# Patient Record
Sex: Male | Born: 1965
Health system: Southern US, Community
[De-identification: ages and names within clinical notes are randomized; demographics above are authoritative.]

## PROBLEM LIST (undated history)

## (undated) DIAGNOSIS — Z9889 Other specified postprocedural states: Secondary | ICD-10-CM

## (undated) DIAGNOSIS — I313 Pericardial effusion (noninflammatory): Secondary | ICD-10-CM

## (undated) DIAGNOSIS — Z9289 Personal history of other medical treatment: Secondary | ICD-10-CM

## (undated) DIAGNOSIS — I7121 Aneurysm of the ascending aorta, without rupture: Secondary | ICD-10-CM

## (undated) DIAGNOSIS — I3139 Other pericardial effusion (noninflammatory): Secondary | ICD-10-CM

## (undated) DIAGNOSIS — I712 Thoracic aortic aneurysm, without rupture: Secondary | ICD-10-CM

## (undated) DIAGNOSIS — I351 Nonrheumatic aortic (valve) insufficiency: Secondary | ICD-10-CM

## (undated) DIAGNOSIS — I428 Other cardiomyopathies: Secondary | ICD-10-CM

## (undated) HISTORY — DX: Nonrheumatic aortic (valve) insufficiency: I35.1

## (undated) HISTORY — DX: Other cardiomyopathies: I42.8

## (undated) HISTORY — DX: Aneurysm of the ascending aorta, without rupture: I71.21

## (undated) HISTORY — DX: Thoracic aortic aneurysm, without rupture: I71.2

## (undated) HISTORY — DX: Personal history of other medical treatment: Z92.89

## (undated) HISTORY — DX: Other specified postprocedural states: Z98.890

---

## 2013-03-29 ENCOUNTER — Emergency Department (HOSPITAL_COMMUNITY)
Admission: EM | Admit: 2013-03-29 | Discharge: 2013-03-29 | Disposition: A | Discharge status: Home / Self Care | Payer: BC Managed Care – PPO | Source: Home / Self Care | Attending: Family Medicine | Admitting: Family Medicine

## 2013-03-29 ENCOUNTER — Emergency Department (INDEPENDENT_AMBULATORY_CARE_PROVIDER_SITE_OTHER): Payer: BC Managed Care – PPO

## 2013-03-29 ENCOUNTER — Encounter (HOSPITAL_COMMUNITY): Payer: Self-pay | Admitting: Emergency Medicine

## 2013-03-29 DIAGNOSIS — J111 Influenza due to unidentified influenza virus with other respiratory manifestations: Secondary | ICD-10-CM

## 2013-03-29 MED ORDER — GUAIFENESIN-CODEINE 100-10 MG/5ML PO SOLN: 5.0000 mL | Freq: Every evening | ORAL | Status: DC | PRN

## 2013-03-29 MED ORDER — OSELTAMIVIR PHOSPHATE 75 MG PO CAPS
75.0000 mg | ORAL_CAPSULE | Freq: Two times a day (BID) | ORAL | Status: DC
Start: 2013-03-29 — End: 2014-02-18

## 2013-03-29 MED ORDER — IPRATROPIUM BROMIDE 0.06 % NA SOLN: 2.0000 | Freq: Four times a day (QID) | NASAL | Status: DC

## 2013-03-29 NOTE — ED Notes (Signed)
C/o cold sx States fever, cough, and congestion in the chest Ibuprofen is taking for fever

## 2013-03-29 NOTE — ED Provider Notes (Signed)
Jeffrey [name] is a 47 y.o. male who presents to Urgent Care today for fever and cough along with nasal congestion and nasal discharge. This is been present since yesterday. Patient to fevers today and took over-the-counter ibuprofen (200mg ) just prior to presentation. No shortness of breath nausea vomiting or diarrhea. Patient feels well otherwise. No significant body aches. No dysuria symptoms.   History reviewed. No pertinent past medical history. History  Substance Use Topics  . Smoking status: Not on file  . Smokeless tobacco: Not on file  . Alcohol Use: Not on file   ROS as above Medications reviewed. No current facility-administered medications for this encounter.   Current Outpatient Prescriptions  Medication Sig Dispense Refill  . guaiFENesin-codeine 100-10 MG/5ML syrup Take 5 mLs by mouth at bedtime as needed for cough.  120 mL  0  . ipratropium (ATROVENT) 0.06 % nasal spray Place 2 sprays into both nostrils 4 (four) times daily.  15 mL  1  . oseltamivir (TAMIFLU) 75 MG capsule Take 1 capsule (75 mg total) by mouth every 12 (twelve) hours.  10 capsule  0    Exam:  BP 151/79  Pulse 95  Temp(Src) 102.5 F (39.2 C) (Oral)  Resp 18  SpO2 100% Gen: Well NAD HEENT: EOMI,  MMM no facial tenderness. Inflamed nasal turbinates are present. Tympanic membranes are normal appearing bilaterally. Posterior pharynx is mildly erythematous. Lungs: Normal work of breathing. CTABL Heart: RRR no MRG Abd: NABS, Soft. NT, ND Exts: Non edematous BL  LE, warm and well perfused.   No results found for this or any previous visit (from the past 24 hour(s)). Dg Chest 2 View  03/29/2013   CLINICAL DATA:  Cough, congestion, fever  EXAM: CHEST  2 VIEW  COMPARISON:  None.  FINDINGS: Low lung volumes. No focal consolidation. No pleural effusion or pneumothorax.  The heart is top-normal in size for inspiration.  Visualized osseous structures are within normal limits.  IMPRESSION: No evidence of  acute cardiopulmonary disease.   Electronically Signed   By: Charline Bills M.D.   On: 03/29/2013 20:39    Assessment and Plan: 47 y.o. male with influenza-like illness. Patient has a significantly elevated temperature. This is abnormal for typical viral URI. Chest x-ray is normal as is his lung exam there for pneumonia is less likely. Plan to treat apparently with Tamiflu for influenza. Additionally will use codeine containing cough medication and Atrovent nasal spray for symptom control. Continue ibuprofen however at higher dose for fever control. Discussed warning signs or symptoms. Please see discharge instructions. Patient expresses understanding.      Rodolph Bong, MD 03/29/13 2051

## 2014-02-18 ENCOUNTER — Emergency Department (HOSPITAL_COMMUNITY): Payer: BC Managed Care – PPO

## 2014-02-18 ENCOUNTER — Inpatient Hospital Stay (HOSPITAL_COMMUNITY)
Admission: EM | Admit: 2014-02-18 | Discharge: 2014-03-03 | DRG: 216 | Disposition: A | Discharge status: Home / Self Care | Payer: BC Managed Care – PPO | Attending: Cardiothoracic Surgery | Admitting: Cardiothoracic Surgery

## 2014-02-18 ENCOUNTER — Encounter (HOSPITAL_COMMUNITY): Payer: Self-pay | Admitting: *Deleted

## 2014-02-18 DIAGNOSIS — I712 Thoracic aortic aneurysm, without rupture, unspecified: Secondary | ICD-10-CM

## 2014-02-18 DIAGNOSIS — K219 Gastro-esophageal reflux disease without esophagitis: Secondary | ICD-10-CM | POA: Diagnosis present

## 2014-02-18 DIAGNOSIS — I313 Pericardial effusion (noninflammatory): Secondary | ICD-10-CM | POA: Diagnosis present

## 2014-02-18 DIAGNOSIS — R0600 Dyspnea, unspecified: Secondary | ICD-10-CM | POA: Diagnosis present

## 2014-02-18 DIAGNOSIS — D696 Thrombocytopenia, unspecified: Secondary | ICD-10-CM | POA: Diagnosis not present

## 2014-02-18 DIAGNOSIS — R011 Cardiac murmur, unspecified: Secondary | ICD-10-CM

## 2014-02-18 DIAGNOSIS — Z79899 Other long term (current) drug therapy: Secondary | ICD-10-CM

## 2014-02-18 DIAGNOSIS — R410 Disorientation, unspecified: Secondary | ICD-10-CM | POA: Diagnosis not present

## 2014-02-18 DIAGNOSIS — D62 Acute posthemorrhagic anemia: Secondary | ICD-10-CM | POA: Diagnosis not present

## 2014-02-18 DIAGNOSIS — Z952 Presence of prosthetic heart valve: Secondary | ICD-10-CM

## 2014-02-18 DIAGNOSIS — Z9889 Other specified postprocedural states: Secondary | ICD-10-CM

## 2014-02-18 DIAGNOSIS — J939 Pneumothorax, unspecified: Secondary | ICD-10-CM

## 2014-02-18 DIAGNOSIS — I428 Other cardiomyopathies: Secondary | ICD-10-CM | POA: Insufficient documentation

## 2014-02-18 DIAGNOSIS — I340 Nonrheumatic mitral (valve) insufficiency: Secondary | ICD-10-CM | POA: Diagnosis present

## 2014-02-18 DIAGNOSIS — I358 Other nonrheumatic aortic valve disorders: Principal | ICD-10-CM | POA: Diagnosis present

## 2014-02-18 DIAGNOSIS — I719 Aortic aneurysm of unspecified site, without rupture: Secondary | ICD-10-CM

## 2014-02-18 DIAGNOSIS — I5033 Acute on chronic diastolic (congestive) heart failure: Secondary | ICD-10-CM | POA: Diagnosis present

## 2014-02-18 DIAGNOSIS — J90 Pleural effusion, not elsewhere classified: Secondary | ICD-10-CM

## 2014-02-18 DIAGNOSIS — I739 Peripheral vascular disease, unspecified: Secondary | ICD-10-CM | POA: Diagnosis present

## 2014-02-18 DIAGNOSIS — Z791 Long term (current) use of non-steroidal anti-inflammatories (NSAID): Secondary | ICD-10-CM

## 2014-02-18 DIAGNOSIS — I509 Heart failure, unspecified: Secondary | ICD-10-CM

## 2014-02-18 DIAGNOSIS — I351 Nonrheumatic aortic (valve) insufficiency: Secondary | ICD-10-CM | POA: Diagnosis present

## 2014-02-18 DIAGNOSIS — Z9689 Presence of other specified functional implants: Secondary | ICD-10-CM

## 2014-02-18 DIAGNOSIS — M109 Gout, unspecified: Secondary | ICD-10-CM | POA: Diagnosis present

## 2014-02-18 DIAGNOSIS — I5032 Chronic diastolic (congestive) heart failure: Secondary | ICD-10-CM

## 2014-02-18 HISTORY — DX: Dyspnea, unspecified: R06.00

## 2014-02-18 LAB — CBC WITH DIFFERENTIAL/PLATELET
Basophils Absolute: 0 10*3/uL (ref 0.0–0.1)
Basophils Relative: 0 % (ref 0–1)
EOS ABS: 0.1 10*3/uL (ref 0.0–0.7)
EOS PCT: 2 % (ref 0–5)
HCT: 41.7 % (ref 39.0–52.0)
Hemoglobin: 14.1 g/dL (ref 13.0–17.0)
LYMPHS ABS: 1.3 10*3/uL (ref 0.7–4.0)
LYMPHS PCT: 17 % (ref 12–46)
MCH: 27.4 pg (ref 26.0–34.0)
MCHC: 33.8 g/dL (ref 30.0–36.0)
MCV: 81.1 fL (ref 78.0–100.0)
Monocytes Absolute: 0.5 10*3/uL (ref 0.1–1.0)
Monocytes Relative: 6 % (ref 3–12)
NEUTROS PCT: 75 % (ref 43–77)
Neutro Abs: 5.7 10*3/uL (ref 1.7–7.7)
Platelets: 177 10*3/uL (ref 150–400)
RBC: 5.14 MIL/uL (ref 4.22–5.81)
RDW: 13.5 % (ref 11.5–15.5)
WBC: 7.6 10*3/uL (ref 4.0–10.5)

## 2014-02-18 LAB — COMPREHENSIVE METABOLIC PANEL
ALK PHOS: 77 U/L (ref 39–117)
ALT: 27 U/L (ref 0–53)
AST: 21 U/L (ref 0–37)
Albumin: 3.5 g/dL (ref 3.5–5.2)
Anion gap: 13 (ref 5–15)
BUN: 11 mg/dL (ref 6–23)
CALCIUM: 9.1 mg/dL (ref 8.4–10.5)
CO2: 21 meq/L (ref 19–32)
Chloride: 105 mEq/L (ref 96–112)
Creatinine, Ser: 0.91 mg/dL (ref 0.50–1.35)
GFR calc non Af Amer: >90 mL/min (ref >90)
GLUCOSE: 101 mg/dL — AB (ref 70–99)
POTASSIUM: 4.7 meq/L (ref 3.7–5.3)
Sodium: 139 mEq/L (ref 137–147)
TOTAL PROTEIN: 6.2 g/dL (ref 6.0–8.3)
Total Bilirubin: 0.6 mg/dL (ref 0.3–1.2)

## 2014-02-18 LAB — CBC
HEMATOCRIT: 40.1 % (ref 39.0–52.0)
Hemoglobin: 13.8 g/dL (ref 13.0–17.0)
MCH: 27.7 pg (ref 26.0–34.0)
MCHC: 34.4 g/dL (ref 30.0–36.0)
MCV: 80.5 fL (ref 78.0–100.0)
Platelets: 172 10*3/uL (ref 150–400)
RBC: 4.98 MIL/uL (ref 4.22–5.81)
RDW: 13.6 % (ref 11.5–15.5)
WBC: 6.8 10*3/uL (ref 4.0–10.5)

## 2014-02-18 LAB — CREATININE, SERUM
Creatinine, Ser: 0.97 mg/dL (ref 0.50–1.35)
GFR calc Af Amer: >90 mL/min (ref >90)
GFR calc non Af Amer: >90 mL/min (ref >90)

## 2014-02-18 LAB — TROPONIN I: Troponin I: <0.3 ng/mL (ref <0.30)

## 2014-02-18 LAB — LIPASE, BLOOD: Lipase: 31 U/L (ref 11–59)

## 2014-02-18 LAB — TSH: TSH: 2.09 u[IU]/mL (ref 0.350–4.500)

## 2014-02-18 LAB — PRO B NATRIURETIC PEPTIDE: Pro B Natriuretic peptide (BNP): 2601 pg/mL — ABNORMAL HIGH (ref 0–125)

## 2014-02-18 MED ORDER — GI COCKTAIL ~~LOC~~
30.0000 mL | Freq: Once | ORAL | Status: AC
  Administered 2014-02-18: 30 mL via ORAL
  Filled 2014-02-18: qty 30

## 2014-02-18 MED ORDER — SODIUM CHLORIDE 0.9 % IV SOLN
250.0000 mL | INTRAVENOUS | Status: DC | PRN
  Administered 2014-02-24: 15:00:00 via INTRAVENOUS

## 2014-02-18 MED ORDER — SODIUM CHLORIDE 0.9 % IJ SOLN
3.0000 mL | Freq: Two times a day (BID) | INTRAMUSCULAR | Status: DC
  Administered 2014-02-18 – 2014-02-19 (×3): 3 mL via INTRAVENOUS

## 2014-02-18 MED ORDER — ASPIRIN 81 MG PO CHEW
81.0000 mg | CHEWABLE_TABLET | Freq: Once | ORAL | Status: AC
  Administered 2014-02-18: 81 mg via ORAL
  Filled 2014-02-18: qty 1

## 2014-02-18 MED ORDER — SODIUM CHLORIDE 0.9 % IJ SOLN: 3.0000 mL | INTRAMUSCULAR | Status: DC | PRN

## 2014-02-18 MED ORDER — HEPARIN SODIUM (PORCINE) 5000 UNIT/ML IJ SOLN
5000.0000 [IU] | Freq: Three times a day (TID) | INTRAMUSCULAR | Status: DC
  Administered 2014-02-18 – 2014-02-22 (×4): 5000 [IU] via SUBCUTANEOUS
  Filled 2014-02-18 (×4): qty 1

## 2014-02-18 MED ORDER — PANTOPRAZOLE SODIUM 40 MG PO TBEC
40.0000 mg | DELAYED_RELEASE_TABLET | Freq: Every day | ORAL | Status: DC
  Administered 2014-02-18 – 2014-02-23 (×6): 40 mg via ORAL
  Filled 2014-02-18 (×6): qty 1

## 2014-02-18 MED ORDER — HYDROCOD POLST-CHLORPHEN POLST 10-8 MG/5ML PO LQCR
5.0000 mL | Freq: Once | ORAL | Status: AC
  Administered 2014-02-19: 5 mL via ORAL
  Filled 2014-02-18: qty 5

## 2014-02-18 MED ORDER — ASPIRIN 81 MG PO CHEW: 324.0000 mg | CHEWABLE_TABLET | Freq: Once | ORAL | Status: DC

## 2014-02-18 MED ORDER — SODIUM CHLORIDE 0.9 % IJ SOLN
3.0000 mL | Freq: Two times a day (BID) | INTRAMUSCULAR | Status: DC
  Administered 2014-02-19 – 2014-02-22 (×3): 3 mL via INTRAVENOUS

## 2014-02-18 NOTE — ED Notes (Signed)
Pt received 3 baby asprin and 2 nitro at urgent care. 20g in LAC

## 2014-02-18 NOTE — ED Notes (Signed)
MD resident at bedside

## 2014-02-18 NOTE — ED Notes (Signed)
Report attempted 

## 2014-02-18 NOTE — ED Notes (Signed)
Per EMS- pt was sick with cold about 1 week ago. Pt states that he has gas and abdominal bloating. Pt states that he feels better when he burps. Pt went to urgent care and was sent here for LVH and heart murmur.

## 2014-02-18 NOTE — H&P (Addendum)
Hospitalist Admission History and Physical  Patient name: Jeffrey [name] Medical record number: [id number] Date of birth: 1965/12/21 Age: 48 y.o. Gender: male  Primary Care Provider: No PCP Per Patient  Chief Complaint: dyspnea   History of Present Illness:This is a 48 y.o. year old male with significant past medical history of gout  presenting with dyspnea, cough. Pt states that he had a mild URI 2-3 weeks ago. Has had lingering cough since this point. States that over the past 2-3 days, he has had indigestion w/ feeling of gas and bloating. Has also had difficulty sleep with orthopnea and PND. Pt denies any prior hx/o similar sxs in the past. No fevers or chills. Denies and CP. No LE swelling or unintentional weight gain. Presented to UC s/ sxs. Noted EKG w/ ? LVH and  T wave inversions. Was redirected to ER for further eval.  On presentation to the ER, hemodynamically stable. BP 130s-140s. Satting >98% on RA. CBC and BMET WNL. Trop neg x 2. CXR shows mild cardiomegaly and mild pulm vascular congestion. EKG shows LVH and T wave inversions in lateral leads. Pro BNP 2600. Received GI cocktail with some improvement in indigestion.   Assessment and Plan: [name] is a 48 y.o. year old male presenting with dyspnea  Active Problems:   Dyspnea   1- Dyspnea  - concern for new onset heart failure  -CXR w/o infiltrate -noted cardiomegaly as well as EKG findings  -? Post viral carditis given recent viral infection-otherwise prior asymptomatic  - 2D ECHO -cycle CEs -check UA  -full dose ASA x 1 -PPI -tele bed  -formal cards consult pending ( I have asked ER resident Modesto Charon to do this, which he agreed)  2- Gout  -currently asymptomatic FEN/GI: heart healthy diet  Prophylaxis: sub q heparin  Disposition: pending further evaluation  Code Status:Full Code    Patient Active Problem List   Diagnosis Date Noted  . Dyspnea 02/18/2014   Past Medical History: Past Medical  History  Diagnosis Date  . Gout     Past Surgical History: History reviewed. No pertinent past surgical history.  Social History: History   Social History  . Marital Status: Married    Spouse Name: N/A    Number of Children: N/A  . Years of Education: N/A   Social History Main Topics  . Smoking status: Never Smoker   . Smokeless tobacco: None  . Alcohol Use: No  . Drug Use: No  . Sexual Activity: None   Other Topics Concern  . None   Social History Narrative    Family History: No family history on file.  Allergies: No Known Allergies  Current Facility-Administered Medications  Medication Dose Route Frequency Provider Last Rate Last Dose  . 0.9 %  sodium chloride infusion  250 mL Intravenous PRN Doree Albee, MD      . heparin injection 5,000 Units  5,000 Units Subcutaneous 3 times per day Doree Albee, MD      . sodium chloride 0.9 % injection 3 mL  3 mL Intravenous Q12H Doree Albee, MD      . sodium chloride 0.9 % injection 3 mL  3 mL Intravenous Q12H Doree Albee, MD      . sodium chloride 0.9 % injection 3 mL  3 mL Intravenous PRN Doree Albee, MD       Current Outpatient Prescriptions  Medication Sig Dispense Refill  . guaiFENesin-codeine 100-10 MG/5ML syrup Take 5 mLs by mouth at bedtime as needed for  cough. 120 mL 0  . ibuprofen (ADVIL,MOTRIN) 200 MG tablet Take 200 mg by mouth every 6 (six) hours as needed.    Marland Kitchen. ipratropium (ATROVENT) 0.06 % nasal spray Place 2 sprays into both nostrils 4 (four) times daily. 15 mL 1   Review Of Systems: 12 point ROS negative except as noted above in HPI.  Physical Exam: Filed Vitals:   02/18/14 1900  BP: 141/69  Pulse: 82  Resp: 26    General: alert and cooperative HEENT: PERRLA and extra ocular movement intact Heart: S1, S2 normal, no murmur, rub or gallop, regular rate and rhythm Lungs: clear to auscultation, no wheezes or rales and unlabored breathing Abdomen: abdomen is soft without significant  tenderness, masses, organomegaly or guarding Extremities: extremities normal, atraumatic, no cyanosis or edema Skin:no rashes Neurology: normal without focal findings  Labs and Imaging: Lab Results  Component Value Date/Time   NA 139 02/18/2014 06:14 PM   K 4.7 02/18/2014 06:14 PM   CL 105 02/18/2014 06:14 PM   CO2 21 02/18/2014 06:14 PM   BUN 11 02/18/2014 06:14 PM   CREATININE 0.91 02/18/2014 06:14 PM   GLUCOSE 101* 02/18/2014 06:14 PM   Lab Results  Component Value Date   WBC 7.6 02/18/2014   HGB 14.1 02/18/2014   HCT 41.7 02/18/2014   MCV 81.1 02/18/2014   PLT 177 02/18/2014    Dg Chest 2 View  02/18/2014   CLINICAL DATA:  Shortness of breath for 1 day, abdominal pain  EXAM: CHEST  2 VIEW  COMPARISON:  03/29/2013  FINDINGS: Enlargement of cardiac silhouette with pulmonary vascular congestion.  Mediastinal contours normal.  Question minimal RIGHT perihilar edema with note of a few Kerley B-lines at the lung bases, likely representing minimal pulmonary edema.  No segmental consolidation, pleural effusion or pneumothorax.  Bones unremarkable.  IMPRESSION: Enlargement of cardiac silhouette with pulmonary vascular congestion and suspect minimal pulmonary edema.   Electronically Signed   By: Ulyses SouthwardMark  Boles M.D.   On: 02/18/2014 18:27           Doree AlbeeSteven Yarexi Pawlicki MD  Pager: [phone]

## 2014-02-18 NOTE — Care Management (Addendum)
ED CM noted no PCP on record. Met with patient at bedside. Verified information, patient states he receives medical care at [facility] on [address].  With Dr. Milagros Evener.  information changed in record.

## 2014-02-18 NOTE — ED Provider Notes (Signed)
CSN: [id number]     Arrival date & time 02/18/14  1701 History   First MD Initiated Contact with Patient 02/18/14 1702     Chief Complaint  Patient presents with  . Abdominal Pain     (Consider location/radiation/quality/duration/timing/severity/associated sxs/prior Treatment) Patient is a 48 y.o. male presenting with shortness of breath. The history is provided by the patient. No language interpreter was used.  Shortness of Breath Severity:  Moderate Onset quality:  Gradual Duration:  4 days Timing:  Constant Progression:  Worsening Chronicity:  New Context: URI   Relieved by:  Sitting up Exacerbated by: laying flat. Ineffective treatments:  None tried Associated symptoms: abdominal pain, cough and PND   Associated symptoms: no chest pain, no fever, no headaches, no rash, no sore throat, no sputum production, no syncope and no vomiting   Associated symptoms comment:  Orthopnea Risk factors: no family hx of DVT, no hx of PE/DVT, no prolonged immobilization and no tobacco use     Past Medical History  Diagnosis Date  . Gout    History reviewed. No pertinent past surgical history. No family history on file. History  Substance Use Topics  . Smoking status: Never Smoker   . Smokeless tobacco: Not on file  . Alcohol Use: No    Review of Systems  Constitutional: Negative for fever.  HENT: Negative for congestion, rhinorrhea and sore throat.   Respiratory: Positive for cough and shortness of breath. Negative for sputum production.   Cardiovascular: Positive for PND. Negative for chest pain and syncope.  Gastrointestinal: Positive for abdominal pain. Negative for nausea, vomiting and diarrhea.  Genitourinary: Negative for dysuria and hematuria.  Skin: Negative for rash.  Neurological: Negative for syncope, light-headedness and headaches.  All other systems reviewed and are negative.     Allergies  Review of patient's allergies indicates no known allergies.  Home  Medications   Prior to Admission medications   Medication Sig Start Date End Date Taking? Authorizing Provider  guaiFENesin-codeine 100-10 MG/5ML syrup Take 5 mLs by mouth at bedtime as needed for cough. 03/29/13   Rodolph BongEvan S Corey, MD  ipratropium (ATROVENT) 0.06 % nasal spray Place 2 sprays into both nostrils 4 (four) times daily. 03/29/13   Rodolph BongEvan S Corey, MD  oseltamivir (TAMIFLU) 75 MG capsule Take 1 capsule (75 mg total) by mouth every 12 (twelve) hours. 03/29/13   Rodolph BongEvan S Corey, MD   BP 147/70 mmHg  Pulse 86  Resp 22  SpO2 99% Physical Exam  Constitutional: He is oriented to person, place, and time. He appears well-developed and well-nourished.  HENT:  Head: Normocephalic and atraumatic.  Right Ear: External ear normal.  Left Ear: External ear normal.  Eyes: EOM are normal.  Neck: Normal range of motion. Neck supple.  Cardiovascular: Normal rate, regular rhythm and intact distal pulses.  Exam reveals no gallop and no friction rub.   Murmur (3/6 blowing systolic murmur) heard. Pulmonary/Chest: Effort normal and breath sounds normal. No respiratory distress. He has no wheezes. He has no rales. He exhibits no tenderness.  Abdominal: Soft. Bowel sounds are normal. He exhibits no distension. There is no tenderness. There is no rebound.  Musculoskeletal: Normal range of motion. He exhibits no edema or tenderness.  Lymphadenopathy:    He has no cervical adenopathy.  Neurological: He is alert and oriented to person, place, and time.  Skin: Skin is warm. No rash noted.  Psychiatric: He has a normal mood and affect. His behavior is normal.  Nursing  note and vitals reviewed.   ED Course  Procedures (including critical care time) Labs Review Labs Reviewed  COMPREHENSIVE METABOLIC PANEL - Abnormal; Notable for the following:    Glucose, Bld 101 (*)    All other components within normal limits  PRO B NATRIURETIC PEPTIDE - Abnormal; Notable for the following:    Pro B Natriuretic peptide  (BNP) 2601.0 (*)    All other components within normal limits  CBC WITH DIFFERENTIAL  TROPONIN I  TROPONIN I  LIPASE, BLOOD  CBC  CREATININE, SERUM  TSH  TROPONIN I  COMPREHENSIVE METABOLIC PANEL  CBC WITH DIFFERENTIAL  TROPONIN I  TROPONIN I  HEMOGLOBIN A1C  URINALYSIS, ROUTINE W REFLEX MICROSCOPIC    Imaging Review Dg Chest 2 View  02/18/2014   CLINICAL DATA:  Shortness of breath for 1 day, abdominal pain  EXAM: CHEST  2 VIEW  COMPARISON:  03/29/2013  FINDINGS: Enlargement of cardiac silhouette with pulmonary vascular congestion.  Mediastinal contours normal.  Question minimal RIGHT perihilar edema with note of a few Kerley B-lines at the lung bases, likely representing minimal pulmonary edema.  No segmental consolidation, pleural effusion or pneumothorax.  Bones unremarkable.  IMPRESSION: Enlargement of cardiac silhouette with pulmonary vascular congestion and suspect minimal pulmonary edema.   Electronically Signed   By: Ulyses Southward M.D.   On: 02/18/2014 18:27     EKG Interpretation   Date/Time:  Tuesday February 18 2014 17:32:47 EST Ventricular Rate:  81 PR Interval:  180 QRS Duration: 108 QT Interval:  406 QTC Calculation: 471 R Axis:   -33 Text Interpretation:  Sinus rhythm Ventricular premature complex LVH with  secondary repolarization abnormality Sinus rhythm Premature ventricular  complexes T wave abnormality Left ventricular hypertrophy Abnormal ekg  Confirmed by Gerhard Munch  MD [phone]) on 02/18/2014 5:51:21 PM      MDM   Final diagnoses:  CHF exacerbation  Cardiac murmur    5:29 PM Pt is a 48 y.o. male with pertinent PMHX of gout who presents to the ED with abdominal bloating, belching, intermittent left abdominal and left chest pain. Also endorsing orthopnea. Seen at Urgent care: sent to [facility] for possible LVH and chest pain. Endorses orthopnea worse over the past 4 days. Endorses no PND. Occasional bloating left sided pain. Endorses cough  productive of white sputum. No nausea, vomiting or diarrhea. No syncope. No dysuria. No unilateral leg swelling. No previous DVT or PE. No immobilization. Denies cocaine abuse or   On exam: well appearing. Lungs clear. Rumbling diastolic murmur. No evidence of pitting edema. No hypoxia. No evidence of pitting edema or JVD  EKG personally reviewed by myself showed NSR PVC, LVH strain Rate of 81, PR , QRS QT/QTC 406/4100ms, normal axis, without evidence of new ischemia. No Comparison, indication: shortness of breath  CXR PA/LAt per my read showed mild vascular congestion no focal consolidation  Review of labs: CBc: no leukocytosis, H&H 14.1/41.7 CMP:  No electrolyte abnormalities, no elevated LFTs Troponin: <0.30 Lipase: 31 Delta troponin: <0.30 BNP: 2601.0  Concern given murmur possible aortic stenosis/insufficiency and new onset of overload and possible heart failure versus possible viral carditis. Will consult hospitalist for admission for echo and diuresis.  Abdominal pain and belching improved after GI cocktail  Plan per hostpialist for admission  Labs, EKG and imaging reviewed by myself and considered in medical decision making if ordered.  Imaging interpreted by radiology. Pt was discussed with my attending, Dr. Barnie Del  Modesto Charon, MD 02/19/14 0330  Gerhard Munch, MD 02/19/14 [phone]

## 2014-02-18 NOTE — ED Notes (Signed)
Jeffrey [name] with social work in to see patient

## 2014-02-19 ENCOUNTER — Observation Stay (HOSPITAL_COMMUNITY): Payer: BC Managed Care – PPO

## 2014-02-19 ENCOUNTER — Encounter (HOSPITAL_COMMUNITY): Payer: Self-pay | Admitting: *Deleted

## 2014-02-19 DIAGNOSIS — I351 Nonrheumatic aortic (valve) insufficiency: Secondary | ICD-10-CM

## 2014-02-19 DIAGNOSIS — I712 Thoracic aortic aneurysm, without rupture: Secondary | ICD-10-CM

## 2014-02-19 DIAGNOSIS — R0600 Dyspnea, unspecified: Secondary | ICD-10-CM

## 2014-02-19 DIAGNOSIS — I319 Disease of pericardium, unspecified: Secondary | ICD-10-CM

## 2014-02-19 LAB — CBC WITH DIFFERENTIAL/PLATELET
Basophils Absolute: 0 10*3/uL (ref 0.0–0.1)
Basophils Relative: 1 % (ref 0–1)
Eosinophils Absolute: 0.2 10*3/uL (ref 0.0–0.7)
Eosinophils Relative: 3 % (ref 0–5)
HEMATOCRIT: 39.3 % (ref 39.0–52.0)
Hemoglobin: 13.4 g/dL (ref 13.0–17.0)
Lymphocytes Relative: 26 % (ref 12–46)
Lymphs Abs: 1.5 10*3/uL (ref 0.7–4.0)
MCH: 27.7 pg (ref 26.0–34.0)
MCHC: 34.1 g/dL (ref 30.0–36.0)
MCV: 81.4 fL (ref 78.0–100.0)
MONO ABS: 0.5 10*3/uL (ref 0.1–1.0)
Monocytes Relative: 9 % (ref 3–12)
Neutro Abs: 3.7 10*3/uL (ref 1.7–7.7)
Neutrophils Relative %: 61 % (ref 43–77)
Platelets: 174 10*3/uL (ref 150–400)
RBC: 4.83 MIL/uL (ref 4.22–5.81)
RDW: 13.6 % (ref 11.5–15.5)
WBC: 6 10*3/uL (ref 4.0–10.5)

## 2014-02-19 LAB — COMPREHENSIVE METABOLIC PANEL
ALBUMIN: 3.1 g/dL — AB (ref 3.5–5.2)
ALK PHOS: 71 U/L (ref 39–117)
ALT: 22 U/L (ref 0–53)
AST: 19 U/L (ref 0–37)
Anion gap: 13 (ref 5–15)
BUN: 13 mg/dL (ref 6–23)
CHLORIDE: 104 meq/L (ref 96–112)
CO2: 20 meq/L (ref 19–32)
Calcium: 8.5 mg/dL (ref 8.4–10.5)
Creatinine, Ser: 0.97 mg/dL (ref 0.50–1.35)
GFR calc non Af Amer: >90 mL/min (ref >90)
GLUCOSE: 110 mg/dL — AB (ref 70–99)
POTASSIUM: 4.1 meq/L (ref 3.7–5.3)
Sodium: 137 mEq/L (ref 137–147)
Total Bilirubin: 0.4 mg/dL (ref 0.3–1.2)
Total Protein: 5.6 g/dL — ABNORMAL LOW (ref 6.0–8.3)

## 2014-02-19 LAB — TROPONIN I: Troponin I: <0.3 ng/mL (ref <0.30)

## 2014-02-19 LAB — URINALYSIS, ROUTINE W REFLEX MICROSCOPIC
Bilirubin Urine: NEGATIVE
GLUCOSE, UA: NEGATIVE mg/dL
Hgb urine dipstick: NEGATIVE
KETONES UR: NEGATIVE mg/dL
LEUKOCYTES UA: NEGATIVE
Nitrite: NEGATIVE
PH: 5 (ref 5.0–8.0)
Protein, ur: NEGATIVE mg/dL
Specific Gravity, Urine: 1.006 (ref 1.005–1.030)
Urobilinogen, UA: 0.2 mg/dL (ref 0.0–1.0)

## 2014-02-19 LAB — HEMOGLOBIN A1C
Hgb A1c MFr Bld: 5.8 % — ABNORMAL HIGH (ref <5.7)
Mean Plasma Glucose: 120 mg/dL — ABNORMAL HIGH (ref <117)

## 2014-02-19 MED ORDER — LISINOPRIL 5 MG PO TABS
5.0000 mg | ORAL_TABLET | Freq: Two times a day (BID) | ORAL | Status: DC
  Administered 2014-02-19 – 2014-02-23 (×10): 5 mg via ORAL
  Filled 2014-02-19 (×11): qty 1

## 2014-02-19 MED ORDER — HYDROCOD POLST-CHLORPHEN POLST 10-8 MG/5ML PO LQCR
5.0000 mL | Freq: Two times a day (BID) | ORAL | Status: DC | PRN
  Administered 2014-02-19 – 2014-02-21 (×4): 5 mL via ORAL
  Filled 2014-02-19 (×4): qty 5

## 2014-02-19 MED ORDER — FUROSEMIDE 10 MG/ML IJ SOLN
40.0000 mg | Freq: Once | INTRAMUSCULAR | Status: AC
  Administered 2014-02-19: 40 mg via INTRAVENOUS
  Filled 2014-02-19: qty 4

## 2014-02-19 MED ORDER — IOHEXOL 350 MG/ML SOLN
100.0000 mL | Freq: Once | INTRAVENOUS | Status: AC | PRN
  Administered 2014-02-19: 100 mL via INTRAVENOUS

## 2014-02-19 NOTE — Progress Notes (Signed)
UR Completed.  

## 2014-02-19 NOTE — Consult Note (Addendum)
CARDIOLOGY CONSULT NOTE  Patient IDZymier [name] MRN: [id number] DOB/AGE: Sep 30, 1965 48 y.o.  Admit date: 02/18/2014 Primary Physician: Dr Barbaraann Barthel Primary Cardiologist: New Reason for Consultation: Murmur, severe AI, ascending aortic aneurysm  HPI: 48 yo with minimal past history was sent to the hospital last night by his PCP because of a loud murmur.  Patient states that he has had a cough for about 2-3 weeks.  For the last 3 nights, he had orthopnea.  He denies exertional dyspnea, chest pain, lightheadedness.  He also felt abdominal bloating.  He went to his PCP yesterday because of the cough.  PCP listened to his chest, heard a murmur, and insisted that he go to the hospital.  CXR showed cardiomegaly and pulmonary vascular congestion, and BNP was elevated.  He was admitted for further evaluation.  He got cough medicine overnight and the cough resolved, he wants to go home.    Echo was done.  This showed mildly dilated LV with mild LVH, moderate diastolic dysfunction, EF 50% with prominent apical trabeculations somewhat concerning for LV noncompaction, trileaflet aortic valve with severe AI, ascending aorta dilated to 6.5 cm with no dissection plane noted, small pericardial effusion.   Review of systems complete and found to be negative unless listed above in HPI  Past Medical History: 1. Gout  FH: No cardiac problems that he knows of.  No sudden death, no history of aneurysm.   History   Social History  . Marital Status: Married    Spouse Name: N/A    Number of Children: N/A  . Years of Education: N/A   Occupational History  . Not on file.   Social History Main Topics  . Smoking status: Never Smoker   . Smokeless tobacco: Not on file  . Alcohol Use: No  . Drug Use: No  . Sexual Activity: Not on file   Other Topics Concern  . Not on file   Social History Narrative     Prescriptions prior to admission  Medication Sig Dispense Refill Last Dose  .  guaiFENesin-codeine 100-10 MG/5ML syrup Take 5 mLs by mouth at bedtime as needed for cough. 120 mL 0 02/17/2014 at Unknown time  . ibuprofen (ADVIL,MOTRIN) 200 MG tablet Take 200 mg by mouth every 6 (six) hours as needed.   02/17/2014 at Unknown time  . ipratropium (ATROVENT) 0.06 % nasal spray Place 2 sprays into both nostrils 4 (four) times daily. 15 mL 1 Past Month at Unknown time   Scheduled Meds: . furosemide  40 mg Intravenous Once  . heparin  5,000 Units Subcutaneous 3 times per day  . lisinopril  5 mg Oral BID  . pantoprazole  40 mg Oral Daily  . sodium chloride  3 mL Intravenous Q12H  . sodium chloride  3 mL Intravenous Q12H   Continuous Infusions:  PRN Meds:.sodium chloride, chlorpheniramine-HYDROcodone, sodium chloride   Physical exam Blood pressure 132/67, pulse 81, temperature 97.4 F (36.3 C), temperature source Oral, resp. rate 20, height 5\' 10"  (1.778 m), weight 218 lb 3.2 oz (98.975 kg), SpO2 100 %. General: NAD Neck: JVP 8-9 cm, no thyromegaly or thyroid nodule.  Lungs: Clear to auscultation bilaterally with normal respiratory effort. CV: Nondisplaced PMI.  Heart regular S1/S2, no S3/S4, 3/6 diastolic murmur along the sternal border.  No peripheral edema.  No carotid bruit.  "Pistol shot" peripheral pulses.  Abdomen: Soft, nontender, no hepatosplenomegaly, no distention.  Skin: Intact without lesions or rashes.  Neurologic: Alert and oriented x 3.  Psych: Normal affect. Extremities: No clubbing or cyanosis.  HEENT: Normal.   Labs:   Lab Results  Component Value Date   WBC 6.0 02/19/2014   HGB 13.4 02/19/2014   HCT 39.3 02/19/2014   MCV 81.4 02/19/2014   PLT 174 02/19/2014    Recent Labs Lab 02/19/14 0415  NA 137  K 4.1  CL 104  CO2 20  BUN 13  CREATININE 0.97  CALCIUM 8.5  PROT 5.6*  BILITOT 0.4  ALKPHOS 71  ALT 22  AST 19  GLUCOSE 110*   Lab Results  Component Value Date   TROPONINI <0.30 02/19/2014  BNP 2601  Radiology:  - CXR:  cardiomegaly, pulmonary vascular congestion  EKG: NSR, LVH with repolarization artifact  ASSESSMENT AND PLAN: 48 yo with minimal past history was sent to the hospital last night by his PCP because of a loud murmur.  He was found on echo to have severe aortic insufficiency with a severely dilated ascending aorta.  1. Aortic insufficiency/dilated ascending aorta: The aortic insufficiency is severe and appears to be due to annular dilatation from the dilated ascending aorta.  The aortic valve incompletely coapts.  The ascending aorta appears dilated to the arch, maximal dimension 6.5 cm.  I do not see evidence for dissection.  The aortic valve is trileaflet.  The patient does not have body habitus consistent with Marfan syndrome or other connective tissue disorder.  He does not have a family history of aneurysmal disease.  His LV EF is mildly decreased to 50% and his LV is mildly dilated.  He has evidence for volume overload on exam and CXR.  He has orthopnea.  I think he is symptomatic from the aortic insufficiency.  I think that he will need surgical repair/replacement of the valve and replacement of the ascending aorta.  - I will get CTA chest for more full assessment of the thoracic aorta and rule out dissection.  - Full evaluation prior to surgery would require TEE and RHC/LHC.  - I will consult cardiac surgery.  - I am going to add ACEI for afterload reduction.  2. CHF: Acute on chronic diastolic CHF (valvular).  Patient has volume overload on exam and mild edema on CXR.  Increased BNP.  I will give him Lasix 40 mg IV x 1 and follow response.  3. 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  EF is mildly decreased at 50% but could be decreased due to long-standing AI.   [name] 02/19/2014 11:57 AM

## 2014-02-19 NOTE — Progress Notes (Signed)
Jeffrey [name] [id number] DOB: [birth year] DOA: 02/18/2014 PCP: Beverley FiedlerANKINS,VICTORIA, MD  Brief narrative90: 48 y/o ? Gout, recent h/p brocnhtiis [facility] with bloating and abd discomfort and persistent cough.  Past medical history-As per Problem list Chart reviewed as below- reviewed  Consultants:   Cardiology  CT surgery  Procedures:  Ct angio  Antibiotics:  none   Subjective  Alert oriented in no distress   Objective    Interim History:   Telemetry:    Objective: Filed Vitals:   02/18/14 1900 02/18/14 2100 02/18/14 2143 02/19/14 0528  BP: 141/69 153/62 145/63 142/61  Pulse: 82 90 93 85  Temp:   98 F (36.7 C) 98.1 F (36.7 C)  TempSrc:   Oral Oral  Resp: 26 21 20 20   Height:   5\' 10"  (1.778 m)   Weight:   97.977 kg (216 lb) 98.975 kg (218 lb 3.2 oz)  SpO2: 99% 100% 100% 97%    Intake/Output Summary (Last 24 hours) at 02/19/14 0958 Last data filed at 02/19/14 0520  Gross per 24 hour  Intake    480 ml  Output   1000 ml  Net   -520 ml    Exam:  General: eomi ncat Cardiovascular: s1 s2 no m/r/g Respiratory: clear   Data Reviewed: Basic Metabolic Panel:  Recent Labs Lab 02/18/14 1814 02/18/14 2208 02/19/14 0415  NA 139  --  137  K 4.7  --  4.1  CL 105  --  104  CO2 21  --  20  GLUCOSE 101*  --  110*  BUN 11  --  13  CREATININE 0.91 0.97 0.97  CALCIUM 9.1  --  8.5   Liver Function Tests:  Recent Labs Lab 02/18/14 1814 02/19/14 0415  AST 21 19  ALT 27 22  ALKPHOS 77 71  BILITOT 0.6 0.4  PROT 6.2 5.6*  ALBUMIN 3.5 3.1*    Recent Labs Lab 02/18/14 1814  LIPASE 31   No results for input(s): AMMONIA in the last 168 hours. CBC:  Recent Labs Lab 02/18/14 1814 02/18/14 2208 02/19/14 0415  WBC 7.6 6.8 6.0  NEUTROABS 5.7  --  3.7  HGB 14.1 13.8 13.4  HCT 41.7 40.1 39.3  MCV 81.1 80.5 81.4  PLT 177 172 174   Cardiac Enzymes:  Recent Labs Lab 02/18/14 1814 02/18/14 1818 02/18/14 2208  02/19/14 0415  TROPONINI <0.30 <0.30 <0.30 <0.30   BNP: Invalid input(s): POCBNP CBG: No results for input(s): GLUCAP in the last 168 hours.  No results found for this or any previous visit (from the past 240 hour(s)).   Studies:              All Imaging reviewed and is as per above notation   Scheduled Meds: . heparin  5,000 Units Subcutaneous 3 times per day  . pantoprazole  40 mg Oral Daily  . sodium chloride  3 mL Intravenous Q12H  . sodium chloride  3 mL Intravenous Q12H   Continuous Infusions:    Assessment/Plan: 1. Acute decompensated diastolic chf per Echo 02/19/14 2/2 to severe AoV regurgitation-Appreciate Cardiology and CT surgery input.  Continue diuretic IV lasix 40 mg.  Cont Lisinopril 5 mg bid.  Hold B blocker for now.  Patient has had a detailed explanation about this from Cardiology-we await the CT chest to rule out dissection.  He has been counselled to stay in the Hospital until Ct surgery sees him.  If he decided to leave, he would  have to do so AGAINST MEDICAL ADVICE as he is at high risk for Aortic rupture  Code Status: Full Family Communication:  D/w family  Disposition Plan: inpatient   Pleas Koch, MD  [facility] Pager [phone] 02/19/2014, 9:58 AM    LOS: 1 day

## 2014-02-19 NOTE — Plan of Care (Signed)
Problem: Phase I Progression Outcomes Goal: Pain controlled with appropriate interventions Outcome: Completed/Met Date Met:  02/19/14 Goal: OOB as tolerated unless otherwise ordered Outcome: Completed/Met Date Met:  02/19/14 Goal: Voiding-avoid urinary catheter unless indicated Outcome: Completed/Met Date Met:  02/19/14

## 2014-02-19 NOTE — Consult Note (Signed)
[address]       [place] [address]             [phone]        Yicheng Rayer [facility] Medical Record #[id number] Date of Birth: 06-27-1965  Referring: No ref. provider found Primary Care: Beverley Fiedler, MD  Chief Complaint:    Chief Complaint  Patient presents with  . Abdominal Pain  patient examined, 2-D echocardiogram and CTA of thoracic aorta reviewed  History of Present Illness:     48 year old male without significant past medical history was admitted the hospital today from his primary care physician with diagnosis of a cardiac murmur and recent onset of abdominal discomfort, bloating, and cough of whitish phlegm. Patient denies chest pain fever, recent dental work, or prior knowledge of a heart murmur. An echocardiogram was initially performed demonstrating severe aortic insufficiency from a trileaflet valve, with annular dilatation being the primary mechanism of AI. The aorticroot was dilated with a fusiform aneurysm extending to the proximal arch vessels-maximum diameter 6.8 cm. There is no evidence of dissection or hematoma or an a penetrating ulcer.There is mild left ventricle dilatation and mild reduction in LV systolic function. No significant MR or TR.no pericardial effusion  The patient underwent a CTA of the thoracic aorta which confirms the fusiform ascending aneurysm from the aortic root to the proximal arch.no evidence of dissection.  CT scan demonstrates a soft appearing 1 cm right upper lobe nodule.   Current Activity/ Functional Status: Patient is employed at [employer] and recently moved to a new house. He is married with a child. No functional disabilities or limitations.   Zubrod Score: At the time of surgery this patient's most appropriate activity status/level should be described as: []     0    Normal activity, no symptoms [x]     1    Restricted in physical strenuous activity but ambulatory, able to do out light  work []     2    Ambulatory and capable of self care, unable to do work activities, up and about                 more than 50%  Of the time                            []     3    Only limited self care, in bed greater than 50% of waking hours []     4    Completely disabled, no self care, confined to bed or chair []     5    Moribund  Past Medical History  Diagnosis Date  . Gout     History reviewed. No pertinent past surgical history.  History  Smoking status  . Never Smoker   Smokeless tobacco  . Not on file    History  Alcohol Use No    History   Social History  . Marital Status: Married    Spouse Name: N/A    Number of Children: N/A  . Years of Education: N/A   Occupational History  . Not on file.   Social History Main Topics  . Smoking status: Never Smoker   . Smokeless tobacco: Not on file  . Alcohol Use: No  . Drug Use: No  . Sexual Activity: Not on file   Other Topics Concern  . Not on file   Social History Narrative  No Known Allergies  Current Facility-Administered Medications  Medication Dose Route Frequency Provider Last Rate Last Dose  . 0.9 %  sodium chloride infusion  250 mL Intravenous PRN Doree Albee, MD      . chlorpheniramine-HYDROcodone (TUSSIONEX) 10-8 MG/5ML suspension 5 mL  5 mL Oral Q12H PRN Rhetta Mura, MD   5 mL at 02/19/14 1223  . heparin injection 5,000 Units  5,000 Units Subcutaneous 3 times per day Doree Albee, MD   5,000 Units at 02/19/14 0602  . lisinopril (PRINIVIL,ZESTRIL) tablet 5 mg  5 mg Oral BID Laurey Morale, MD   5 mg at 02/19/14 1258  . pantoprazole (PROTONIX) EC tablet 40 mg  40 mg Oral Daily Doree Albee, MD   40 mg at 02/19/14 1040  . sodium chloride 0.9 % injection 3 mL  3 mL Intravenous Q12H Doree Albee, MD   3 mL at 02/19/14 1042  . sodium chloride 0.9 % injection 3 mL  3 mL Intravenous Q12H Doree Albee, MD   3 mL at 02/19/14 1032  . sodium chloride 0.9 % injection 3 mL  3 mL Intravenous PRN  Doree Albee, MD        Prescriptions prior to admission  Medication Sig Dispense Refill Last Dose  . guaiFENesin-codeine 100-10 MG/5ML syrup Take 5 mLs by mouth at bedtime as needed for cough. 120 mL 0 02/17/2014 at Unknown time  . ibuprofen (ADVIL,MOTRIN) 200 MG tablet Take 200 mg by mouth every 6 (six) hours as needed.   02/17/2014 at Unknown time  . ipratropium (ATROVENT) 0.06 % nasal spray Place 2 sprays into both nostrils 4 (four) times daily. 15 mL 1 Past Month at Unknown time    No family history on file.   Review of Systems:     Cardiac Review of Systems: Y or N  Chest Pain [ no   ]  Resting SOB [ no  ] Exertional SOB  [ no ]  Orthopnea Mahler.Beck  ]   Pedal Edema [ no  ]    Palpitations [no  ] Syncope  [ no ]   Presyncope [   ]  General Review of Systems: [Y] = yes [  ]=no Constitional: recent weight change [  no]; anorexia [  ]; fatigue [  ]; nausea [  ]; night sweats [no  ]; fever [  ]; or chills [  ]                                                               Dental: poor dentition[  ]; Last Dentist visit:every 6 months   Eye : blurred vision [  ]; diplopia [   ]; vision changes [  ];  Amaurosis fugax[  ]; Resp: cough [  ];  wheezing[  ];  hemoptysis[  ]; shortness of breath[  ]; paroxysmal nocturnal dyspnea[  ]; dyspnea on exertion[  ]; or orthopnea[  ];  GI:  gallstones[  ], vomiting[  ];  dysphagia[  ]; melena[  ];  hematochezia [  ]; heartburn[  ];   Hx of  Colonoscopy[  ]; GU: kidney stones [  ]; hematuria[  ];   dysuria [  ];  nocturia[  ];  history of     obstruction [  ];  urinary frequency [  ]             Skin: rash, swelling[  ];, hair loss[  ];  peripheral edema[  ];  or itching[  ]; Musculosketetal: myalgias[  ];  joint swelling[  ];  joint erythema[  ];  joint pain[  ];  back pain[  ];  Heme/Lymph: bruising[  ];  bleeding[  ];  anemia[  ];  Neuro: TIA[  ];  headaches[  ];  stroke[  ];  vertigo[  ];  seizures[  ];   paresthesias[  ];  difficulty walking[   ];  Psych:depression[  ]; anxiety[  ];  Endocrine: diabetes[no  ];  thyroid dysfunction[  ];  Immunizations: Flu [  ]; Pneumococcal[  ];  Other:  Physical Exam: BP 129/50 mmHg  Pulse 87  Temp(Src) 98.8 F (37.1 C) (Oral)  Resp 18  Ht 5\' 10"  (1.778 m)  Wt 218 lb 3.2 oz (98.975 kg)  BMI 31.31 kg/m2  SpO2 100%  Gen. appearance-middle-aged male anxious but in no acute distress HEENT-pupils equal, good dentition, normal uvula Neck-no JVD or mass, good carotid pulses bilaterally Thorax-no deformity or tenderness, clear Cardiac-3/6 diastolic murmur of left  lower sternal border Abdomen"nontender, no pulsatile mass Extremities-no clubbing cyanosis edema Vascular-prominent waterhammer pulses in all extremities Neuro-alert and oriented no focal motor deficit, right-hand dominant  Diagnostic Studies & Laboratory data:     Recent Radiology Findings:   Dg Chest 2 View  02/18/2014   CLINICAL DATA:  Shortness of breath for 1 day, abdominal pain  EXAM: CHEST  2 VIEW  COMPARISON:  03/29/2013  FINDINGS: Enlargement of cardiac silhouette with pulmonary vascular congestion.  Mediastinal contours normal.  Question minimal RIGHT perihilar edema with note of a few Kerley B-lines at the lung bases, likely representing minimal pulmonary edema.  No segmental consolidation, pleural effusion or pneumothorax.  Bones unremarkable.  IMPRESSION: Enlargement of cardiac silhouette with pulmonary vascular congestion and suspect minimal pulmonary edema.   Electronically Signed   By: Ulyses SouthwardMark  Boles M.D.   On: 02/18/2014 18:27   Ct Angio Chest Aortic Dissect W &/or W/o  02/19/2014   CLINICAL DATA:  Cough and shortness of breath, history of aortic aneurysm  EXAM: CT ANGIOGRAPHY CHEST WITH CONTRAST  TECHNIQUE: Multidetector CT imaging of the chest was performed using the standard protocol during bolus administration of intravenous contrast. Multiplanar CT image reconstructions and MIPs were obtained to evaluate the  vascular anatomy.  CONTRAST:  100mL OMNIPAQUE IOHEXOL 350 MG/ML SOLN  COMPARISON:  Plain film from earlier in the same day.  FINDINGS: The lungs are well aerated but demonstrate mild emphysematous changes bilaterally. Somewhat linear appearing nodular density is min  Noted in the right upper lobe measuring 10 mm in greatest dimension. It has a sub solid component. This is best seen on the coronal imaging image 66 of series 8. No other parenchymal abnormalities are noted. Small right-sided pleural effusion is seen.  The hilar and mediastinal structures show aneurysmal dilatation of the ascending aorta. The aneurysm arises at the level aortic root. Although not gated for cardiac motion measurement at the sino-tubular junction is approximately 5.4 x 5.5 cm in greatest transverse and AP dimensions. It measures approximately 6.8 x 7.1 cm in greatest AP and transverse dimensions in the ascending aorta at the level of the main pulmonary artery. It does not appear to significantly involves the arch and the descending aorta tapers in a normal fashion. No dissection is identified the left  vertebral artery arises directly from the aortic arch. The origins of the brachiocephalic vessels are within normal limits. No significant coronary calcifications are noted. No right heart strain is seen. Although not timed for pulmonary artery evaluation no definitive pulmonary embolism is seen  The visualized upper abdomen is within normal limits. Some variant anatomy is noted with the left gastric artery arising directly from the aorta. The osseous structures show no acute abnormality.  Review of the MIP images confirms the above findings.  IMPRESSION: No evidence of pulmonary emboli. A small right pleural effusion is noted.  Ascending aortic aneurysm as described above.  Nodular changes within the right upper lobe. If the patient is at high risk for bronchogenic carcinoma, follow-up chest CT at 3-24months is recommended. If the patient is  at low risk for bronchogenic carcinoma, follow-up chest CT at 6-12 months is recommended. This recommendation follows the consensus statement: Guidelines for Management of Small Pulmonary Nodules Detected on CT Scans: A Statement from the Fleischner Society as published in Radiology 2005; 237:395-400.  Variant anatomy of the left vertebral artery and left gastric artery.   Electronically Signed   By: Alcide Clever M.D.   On: 02/19/2014 17:04      Recent Lab Findings: Lab Results  Component Value Date   WBC 6.0 02/19/2014   HGB 13.4 02/19/2014   HCT 39.3 02/19/2014   PLT 174 02/19/2014   GLUCOSE 110* 02/19/2014   ALT 22 02/19/2014   AST 19 02/19/2014   NA 137 02/19/2014   K 4.1 02/19/2014   CL 104 02/19/2014   CREATININE 0.97 02/19/2014   BUN 13 02/19/2014   CO2 20 02/19/2014   TSH 2.090 02/18/2014   HGBA1C 5.8* 02/18/2014      Assessment / Plan:      Middle-aged male presents with symptoms from pulmonary and abdominal visceral congestion from severe aortic insufficiency with a 6 cm fusiform ascending aneurysm. No chest pain. No family history of aortic aneurysm or aortic dissection. Patient is not hypertensive and has been started on low-dose ACE inhibitor.  Patient will need aortic root and ascending aorta replacement with a combined valve-conduit. Prior to surgery patient will need a dental evaluation and left and right heart cardiac catheterization. The patient understands the indications for surgery. This is not an emergency at this time but the patient is recommended to have the surgery in the near future.    @ME1 @ 02/19/2014 7:24 PM

## 2014-02-19 NOTE — Progress Notes (Signed)
  Echocardiogram 2D Echocardiogram has been performed.  Jeffrey [name] 02/19/2014, 10:20 AM

## 2014-02-20 ENCOUNTER — Encounter (HOSPITAL_COMMUNITY): Payer: Self-pay | Admitting: *Deleted

## 2014-02-20 ENCOUNTER — Encounter (HOSPITAL_COMMUNITY)
Admission: EM | Disposition: A | Discharge status: Home / Self Care | Payer: Self-pay | Source: Home / Self Care | Attending: Cardiothoracic Surgery

## 2014-02-20 ENCOUNTER — Observation Stay (HOSPITAL_COMMUNITY): Payer: BC Managed Care – PPO

## 2014-02-20 ENCOUNTER — Other Ambulatory Visit: Payer: Self-pay | Admitting: *Deleted

## 2014-02-20 DIAGNOSIS — I719 Aortic aneurysm of unspecified site, without rupture: Secondary | ICD-10-CM

## 2014-02-20 DIAGNOSIS — I351 Nonrheumatic aortic (valve) insufficiency: Secondary | ICD-10-CM

## 2014-02-20 DIAGNOSIS — I712 Thoracic aortic aneurysm, without rupture, unspecified: Secondary | ICD-10-CM | POA: Insufficient documentation

## 2014-02-20 HISTORY — DX: Nonrheumatic aortic (valve) insufficiency: I35.1

## 2014-02-20 HISTORY — PX: TEE WITHOUT CARDIOVERSION: SHX5443

## 2014-02-20 HISTORY — PX: LEFT AND RIGHT HEART CATHETERIZATION WITH CORONARY ANGIOGRAM: SHX5449

## 2014-02-20 LAB — CBC WITH DIFFERENTIAL/PLATELET
BASOS ABS: 0 10*3/uL (ref 0.0–0.1)
Basophils Relative: 0 % (ref 0–1)
EOS PCT: 3 % (ref 0–5)
Eosinophils Absolute: 0.2 10*3/uL (ref 0.0–0.7)
HCT: 40.5 % (ref 39.0–52.0)
Hemoglobin: 14.2 g/dL (ref 13.0–17.0)
Lymphocytes Relative: 28 % (ref 12–46)
Lymphs Abs: 1.9 10*3/uL (ref 0.7–4.0)
MCH: 28.3 pg (ref 26.0–34.0)
MCHC: 35.1 g/dL (ref 30.0–36.0)
MCV: 80.7 fL (ref 78.0–100.0)
Monocytes Absolute: 0.6 10*3/uL (ref 0.1–1.0)
Monocytes Relative: 9 % (ref 3–12)
Neutro Abs: 3.9 10*3/uL (ref 1.7–7.7)
Neutrophils Relative %: 60 % (ref 43–77)
PLATELETS: 175 10*3/uL (ref 150–400)
RBC: 5.02 MIL/uL (ref 4.22–5.81)
RDW: 13.7 % (ref 11.5–15.5)
WBC: 6.6 10*3/uL (ref 4.0–10.5)

## 2014-02-20 LAB — POCT I-STAT 3, VENOUS BLOOD GAS (G3P V)
Acid-base deficit: 2 mmol/L (ref 0.0–2.0)
BICARBONATE: 23.6 meq/L (ref 20.0–24.0)
Bicarbonate: 25.1 mEq/L — ABNORMAL HIGH (ref 20.0–24.0)
O2 SAT: 64 %
O2 Saturation: 65 %
PCO2 VEN: 40.9 mmHg — AB (ref 45.0–50.0)
PH VEN: 7.369 — AB (ref 7.250–7.300)
PO2 VEN: 35 mmHg (ref 30.0–45.0)
TCO2: 25 mmol/L (ref 0–100)
TCO2: 26 mmol/L (ref 0–100)
pCO2, Ven: 43.7 mmHg — ABNORMAL LOW (ref 45.0–50.0)
pH, Ven: 7.368 — ABNORMAL HIGH (ref 7.250–7.300)
pO2, Ven: 34 mmHg (ref 30.0–45.0)

## 2014-02-20 LAB — POCT I-STAT 3, ART BLOOD GAS (G3+)
Acid-base deficit: 3 mmol/L — ABNORMAL HIGH (ref 0.0–2.0)
Bicarbonate: 23 mEq/L (ref 20.0–24.0)
O2 Saturation: 95 %
PCO2 ART: 44.1 mmHg (ref 35.0–45.0)
PH ART: 7.325 — AB (ref 7.350–7.450)
TCO2: 24 mmol/L (ref 0–100)
pO2, Arterial: 83 mmHg (ref 80.0–100.0)

## 2014-02-20 LAB — COMPREHENSIVE METABOLIC PANEL
ALK PHOS: 77 U/L (ref 39–117)
ALT: 21 U/L (ref 0–53)
AST: 18 U/L (ref 0–37)
Albumin: 3.3 g/dL — ABNORMAL LOW (ref 3.5–5.2)
Anion gap: 16 — ABNORMAL HIGH (ref 5–15)
BILIRUBIN TOTAL: 0.6 mg/dL (ref 0.3–1.2)
BUN: 14 mg/dL (ref 6–23)
CHLORIDE: 104 meq/L (ref 96–112)
CO2: 21 meq/L (ref 19–32)
Calcium: 8.8 mg/dL (ref 8.4–10.5)
Creatinine, Ser: 1.14 mg/dL (ref 0.50–1.35)
GFR calc Af Amer: 86 mL/min — ABNORMAL LOW (ref >90)
GFR calc non Af Amer: 74 mL/min — ABNORMAL LOW (ref >90)
Glucose, Bld: 106 mg/dL — ABNORMAL HIGH (ref 70–99)
Potassium: 4.1 mEq/L (ref 3.7–5.3)
SODIUM: 141 meq/L (ref 137–147)
Total Protein: 6 g/dL (ref 6.0–8.3)

## 2014-02-20 SURGERY — ECHOCARDIOGRAM, TRANSESOPHAGEAL
Anesthesia: Moderate Sedation

## 2014-02-20 SURGERY — LEFT AND RIGHT HEART CATHETERIZATION WITH CORONARY ANGIOGRAM
Anesthesia: LOCAL

## 2014-02-20 MED ORDER — ZOLPIDEM TARTRATE 5 MG PO TABS
5.0000 mg | ORAL_TABLET | Freq: Once | ORAL | Status: AC
  Administered 2014-02-20: 5 mg via ORAL
  Filled 2014-02-20: qty 1

## 2014-02-20 MED ORDER — SODIUM CHLORIDE 0.9 % IJ SOLN: 3.0000 mL | INTRAMUSCULAR | Status: DC | PRN

## 2014-02-20 MED ORDER — FENTANYL CITRATE 0.05 MG/ML IJ SOLN
INTRAMUSCULAR | Status: AC
  Filled 2014-02-20: qty 2

## 2014-02-20 MED ORDER — FENTANYL CITRATE 0.05 MG/ML IJ SOLN
INTRAMUSCULAR | Status: DC | PRN
  Administered 2014-02-20: 50 ug via INTRAVENOUS

## 2014-02-20 MED ORDER — ASPIRIN 81 MG PO CHEW
81.0000 mg | CHEWABLE_TABLET | ORAL | Status: DC
  Filled 2014-02-20: qty 1

## 2014-02-20 MED ORDER — SODIUM CHLORIDE 0.9 % IJ SOLN
3.0000 mL | Freq: Two times a day (BID) | INTRAMUSCULAR | Status: DC
Start: 2014-02-21 — End: 2014-02-24
  Administered 2014-02-21 – 2014-02-23 (×5): 3 mL via INTRAVENOUS

## 2014-02-20 MED ORDER — SODIUM CHLORIDE 0.9 % IV SOLN: INTRAVENOUS | Status: DC

## 2014-02-20 MED ORDER — BUTAMBEN-TETRACAINE-BENZOCAINE 2-2-14 % EX AERO
INHALATION_SPRAY | CUTANEOUS | Status: DC | PRN
  Administered 2014-02-20: 2 via TOPICAL

## 2014-02-20 MED ORDER — MIDAZOLAM HCL 5 MG/ML IJ SOLN
INTRAMUSCULAR | Status: AC
  Filled 2014-02-20: qty 2

## 2014-02-20 MED ORDER — SODIUM CHLORIDE 0.9 % IV SOLN: 250.0000 mL | INTRAVENOUS | Status: DC | PRN

## 2014-02-20 MED ORDER — ACETAMINOPHEN 325 MG PO TABS
650.0000 mg | ORAL_TABLET | ORAL | Status: DC | PRN
  Filled 2014-02-20: qty 2

## 2014-02-20 MED ORDER — ASPIRIN 81 MG PO CHEW: 81.0000 mg | CHEWABLE_TABLET | ORAL | Status: DC

## 2014-02-20 MED ORDER — SODIUM CHLORIDE 0.9 % IJ SOLN: 3.0000 mL | Freq: Two times a day (BID) | INTRAMUSCULAR | Status: DC

## 2014-02-20 MED ORDER — ONDANSETRON HCL 4 MG/2ML IJ SOLN: 4.0000 mg | Freq: Four times a day (QID) | INTRAMUSCULAR | Status: DC | PRN

## 2014-02-20 MED ORDER — MIDAZOLAM HCL 10 MG/2ML IJ SOLN
INTRAMUSCULAR | Status: DC | PRN
  Administered 2014-02-20: 1 mg via INTRAVENOUS
  Administered 2014-02-20: 2 mg via INTRAVENOUS
  Administered 2014-02-20: 1 mg via INTRAVENOUS
  Administered 2014-02-20: 2 mg via INTRAVENOUS

## 2014-02-20 MED ORDER — NITROGLYCERIN 1 MG/10 ML FOR IR/CATH LAB
INTRA_ARTERIAL | Status: AC
  Filled 2014-02-20: qty 10

## 2014-02-20 MED ORDER — MIDAZOLAM HCL 2 MG/2ML IJ SOLN
INTRAMUSCULAR | Status: AC
  Filled 2014-02-20: qty 2

## 2014-02-20 MED ORDER — HEPARIN (PORCINE) IN NACL 2-0.9 UNIT/ML-% IJ SOLN
INTRAMUSCULAR | Status: AC
  Filled 2014-02-20: qty 1000

## 2014-02-20 MED ORDER — LIDOCAINE HCL (PF) 1 % IJ SOLN
INTRAMUSCULAR | Status: AC
  Filled 2014-02-20: qty 30

## 2014-02-20 NOTE — Progress Notes (Signed)
Jeffrey [name] JGG:[id number] DOB: 12-31-1965 DOA: 02/18/2014 PCP: Beverley Fiedler, MD  Brief narrative: 48 y/o ? Gout, recent h/p brocnhtiis presented to [facility] 02/18/14 with bloating and abd discomfort and persistent cough. He was found to have severe Aortic dilatation, and AoV insuff  Past medical history-As per Problem list Chart reviewed as below- reviewed  Consultants:   Cardiology  CT surgery  Procedures:  Ct angio  Antibiotics:  none   Subjective  Doing fair Just had TEE and also Cardiac cath Seems to understand what is going on No n/v/cp/sob     Objective    Interim History:   Telemetry:    Objective: Filed Vitals:   02/20/14 1114 02/20/14 1334 02/20/14 1349 02/20/14 1405  BP:  144/60 148/56 150/61  Pulse: 75 79 82 80  Temp:      TempSrc:      Resp:  28 29 19   Height:      Weight:      SpO2:  95% 99% 98%    Intake/Output Summary (Last 24 hours) at 02/20/14 1418 Last data filed at 02/19/14 2200  Gross per 24 hour  Intake    243 ml  Output      0 ml  Net    243 ml    Exam:  General: eomi ncat Cardiovascular: s1 s2 no m/r/g Respiratory: clear Abd: soft, Nt/ND   Data Reviewed: Basic Metabolic Panel:  Recent Labs Lab 02/18/14 1814 02/18/14 2208 02/19/14 0415 02/20/14 0325  NA 139  --  137 141  K 4.7  --  4.1 4.1  CL 105  --  104 104  CO2 21  --  20 21  GLUCOSE 101*  --  110* 106*  BUN 11  --  13 14  CREATININE 0.91 0.97 0.97 1.14  CALCIUM 9.1  --  8.5 8.8   Liver Function Tests:  Recent Labs Lab 02/18/14 1814 02/19/14 0415 02/20/14 0325  AST 21 19 18   ALT 27 22 21   ALKPHOS 77 71 77  BILITOT 0.6 0.4 0.6  PROT 6.2 5.6* 6.0  ALBUMIN 3.5 3.1* 3.3*    Recent Labs Lab 02/18/14 1814  LIPASE 31   No results for input(s): AMMONIA in the last 168 hours. CBC:  Recent Labs Lab 02/18/14 1814 02/18/14 2208 02/19/14 0415 02/20/14 0325  WBC 7.6 6.8 6.0 6.6  NEUTROABS 5.7  --  3.7 3.9  HGB 14.1 13.8 13.4  14.2  HCT 41.7 40.1 39.3 40.5  MCV 81.1 80.5 81.4 80.7  PLT 177 172 174 175   Cardiac Enzymes:  Recent Labs Lab 02/18/14 1814 02/18/14 1818 02/18/14 2208 02/19/14 0415 02/19/14 0851  TROPONINI <0.30 <0.30 <0.30 <0.30 <0.30   BNP: Invalid input(s): POCBNP CBG: No results for input(s): GLUCAP in the last 168 hours.  No results found for this or any previous visit (from the past 240 hour(s)).   Studies:              All Imaging reviewed and is as per above notation   Scheduled Meds: . heparin  5,000 Units Subcutaneous 3 times per day  . lisinopril  5 mg Oral BID  . pantoprazole  40 mg Oral Daily  . sodium chloride  3 mL Intravenous Q12H  . sodium chloride  3 mL Intravenous Q12H  . [START ON 02/21/2014] sodium chloride  3 mL Intravenous Q12H   Continuous Infusions:    Assessment/Plan:  1. Acute decompensated diastolic chf per Echo 02/19/14 2/2 to severe AoV regurgitation-Appreciate  Cardiology and CT surgery input.  Continue diuretic IV lasix 40 mg.  Cont Lisinopril 5 mg bid.  Hold B blocker for now.   2. Ascending AoV aneurysm dilatation-CT chest confirms dilatation.  TEE AoV 6.9 cm enlargement.  Cath confirms dilation.  He will get Panorex to rule out caries and will be scheduled for surgery-He is contemplating [facility] here [facility]. 3. Gerd-continue Pantoprazole  Code Status: Full Family Communication:  D/w family at bedside Disposition Plan: inpatient   Pleas KochJai Jaskaran Dauzat, MD  [facility] Pager [phone] 02/20/2014, 2:18 PM    LOS: 2 days

## 2014-02-20 NOTE — OR Nursing (Signed)
Transferred patient to cardiac cath lab after TEE

## 2014-02-20 NOTE — Progress Notes (Signed)
Day of Surgery Procedure(s) (LRB): LEFT AND RIGHT HEART CATHETERIZATION WITH CORONARY ANGIOGRAM (N/A) Subjective: Results of cath d/w Dayle Points, MD Severe AI with extreme elevation of LVEDP, PCWP  Surgical repair in the next few days is recommended by cardiology and I agree Will schedule [place] aortic root and replacement of ascending aorta next available OR date- Monday Nov 16 He should remain hospitalized due to risk of sudden decompensation Panorex is ok Objective: Vital signs in last 24 hours: Temp:  [98.1 F (36.7 C)-98.5 F (36.9 C)] 98.5 F (36.9 C) (11/12 2013) Pulse Rate:  [75-94] 84 (11/12 2013) Cardiac Rhythm:  [-] Normal sinus rhythm (11/12 2000) Resp:  [9-37] 24 (11/12 1820) BP: (111-178)/(54-102) 140/64 mmHg (11/12 2013) SpO2:  [94 %-100 %] 97 % (11/12 2013) Weight:  [214 lb 11.2 oz (97.387 kg)] 214 lb 11.2 oz (97.387 kg) (11/12 0541)  Hemodynamic parameters for last 24 hours:  nsr  Intake/Output from previous day: 11/11 0701 - 11/12 0700 In: 243 [P.O.:240; I.V.:3] Out: -  Intake/Output this shift: Total I/O In: 240 [P.O.:240] Out: -   No groin hematoma nsr Lab Results:  Recent Labs  02/19/14 0415 02/20/14 0325  WBC 6.0 6.6  HGB 13.4 14.2  HCT 39.3 40.5  PLT 174 175   BMET:  Recent Labs  02/19/14 0415 02/20/14 0325  NA 137 141  K 4.1 4.1  CL 104 104  CO2 20 21  GLUCOSE 110* 106*  BUN 13 14  CREATININE 0.97 1.14  CALCIUM 8.5 8.8    PT/INR: No results for input(s): LABPROT, INR in the last 72 hours. ABG    Component Value Date/Time   PHART 7.325* 02/20/2014 1201   HCO3 23.0 02/20/2014 1201   TCO2 24 02/20/2014 1201   ACIDBASEDEF 3.0* 02/20/2014 1201   O2SAT 95.0 02/20/2014 1201   CBG (last 3)  No results for input(s): GLUCAP in the last 72 hours.  Assessment/Plan: S/P Procedure(s) (LRB): LEFT AND RIGHT HEART CATHETERIZATION WITH CORONARY ANGIOGRAM (N/A) Plan surgery mon   LOS: 2 days    VAN TRIGT III,PETER 02/20/2014

## 2014-02-20 NOTE — Progress Notes (Signed)
Patient ID: Jeffrey [name], male   DOB: 04-12-1965, 48 y.o.   MRN: [id number]    Subjective:  Denies SSCP, palpitations or Dyspnea Wants to do surgery in Dec/Jan  Objective:  Filed Vitals:   02/19/14 1257 02/19/14 1500 02/19/14 2036 02/20/14 0541  BP: 155/75 129/50 123/56 111/54  Pulse:  87 84 79  Temp:  98.8 F (37.1 C) 98.2 F (36.8 C) 98.3 F (36.8 C)  TempSrc:  Oral Oral Oral  Resp:  18    Height:      Weight:    97.387 kg (214 lb 11.2 oz)  SpO2:  100% 97% 96%    Intake/Output from previous day:  Intake/Output Summary (Last 24 hours) at 02/20/14 0901 Last data filed at 02/19/14 2200  Gross per 24 hour  Intake    243 ml  Output      0 ml  Net    243 ml    Physical Exam: Affect appropriate Healthy:  appears stated age HEENT: normal Neck supple with no adenopathy JVP normal no bruits no thyromegaly Lungs clear with no wheezing and good diaphragmatic motion Heart:  S1/S2 AR  murmur, no rub, gallop or click PMI normal Abdomen: benighn, BS positve, no tenderness, no AAA no bruit.  No HSM or HJR Distal pulses intact with no bruits No edema Neuro non-focal Skin warm and dry No muscular weakness   Lab Results: Basic Metabolic Panel:  Recent Labs  40/98/11 0415 02/20/14 0325  NA 137 141  K 4.1 4.1  CL 104 104  CO2 20 21  GLUCOSE 110* 106*  BUN 13 14  CREATININE 0.97 1.14  CALCIUM 8.5 8.8   Liver Function Tests:  Recent Labs  02/19/14 0415 02/20/14 0325  AST 19 18  ALT 22 21  ALKPHOS 71 77  BILITOT 0.4 0.6  PROT 5.6* 6.0  ALBUMIN 3.1* 3.3*    Recent Labs  02/18/14 1814  LIPASE 31   CBC:  Recent Labs  02/19/14 0415 02/20/14 0325  WBC 6.0 6.6  NEUTROABS 3.7 3.9  HGB 13.4 14.2  HCT 39.3 40.5  MCV 81.4 80.7  PLT 174 175   Cardiac Enzymes:  Recent Labs  02/18/14 2208 02/19/14 0415 02/19/14 0851  TROPONINI <0.30 <0.30 <0.30   Hemoglobin A1C:  Recent Labs  02/18/14 2208  HGBA1C 5.8*   Thyroid Function  Tests:  Recent Labs  02/18/14 2208  TSH 2.090    Imaging: Dg Chest 2 View  02/18/2014   CLINICAL DATA:  Shortness of breath for 1 day, abdominal pain  EXAM: CHEST  2 VIEW  COMPARISON:  03/29/2013  FINDINGS: Enlargement of cardiac silhouette with pulmonary vascular congestion.  Mediastinal contours normal.  Question minimal RIGHT perihilar edema with note of a few Kerley B-lines at the lung bases, likely representing minimal pulmonary edema.  No segmental consolidation, pleural effusion or pneumothorax.  Bones unremarkable.  IMPRESSION: Enlargement of cardiac silhouette with pulmonary vascular congestion and suspect minimal pulmonary edema.   Electronically Signed   By: Ulyses Southward M.D.   On: 02/18/2014 18:27   Ct Angio Chest Aortic Dissect W &/or W/o  02/19/2014   CLINICAL DATA:  Cough and shortness of breath, history of aortic aneurysm  EXAM: CT ANGIOGRAPHY CHEST WITH CONTRAST  TECHNIQUE: Multidetector CT imaging of the chest was performed using the standard protocol during bolus administration of intravenous contrast. Multiplanar CT image reconstructions and MIPs were obtained to evaluate the vascular anatomy.  CONTRAST:  OMNIPAQUE IOHEXOL 350 MG/ML SOLN  COMPARISON:  Plain film from earlier in the same day.  FINDINGS: The lungs are well aerated but demonstrate mild emphysematous changes bilaterally. Somewhat linear appearing nodular density is min  Noted in the right upper lobe measuring 10 mm in greatest dimension. It has a sub solid component. This is best seen on the coronal imaging image 66 of series 8. No other parenchymal abnormalities are noted. Small right-sided pleural effusion is seen.  The hilar and mediastinal structures show aneurysmal dilatation of the ascending aorta. The aneurysm arises at the level aortic root. Although not gated for cardiac motion measurement at the sino-tubular junction is approximately 5.4 x 5.5 cm in greatest transverse and AP dimensions. It measures  approximately 6.8 x 7.1 cm in greatest AP and transverse dimensions in the ascending aorta at the level of the main pulmonary artery. It does not appear to significantly involves the arch and the descending aorta tapers in a normal fashion. No dissection is identified the left vertebral artery arises directly from the aortic arch. The origins of the brachiocephalic vessels are within normal limits. No significant coronary calcifications are noted. No right heart strain is seen. Although not timed for pulmonary artery evaluation no definitive pulmonary embolism is seen  The visualized upper abdomen is within normal limits. Some variant anatomy is noted with the left gastric artery arising directly from the aorta. The osseous structures show no acute abnormality.  Review of the MIP images confirms the above findings.  IMPRESSION: No evidence of pulmonary emboli. A small right pleural effusion is noted.  Ascending aortic aneurysm as described above.  Nodular changes within the right upper lobe. If the patient is at high risk for bronchogenic carcinoma, follow-up chest CT at 3-636months is recommended. If the patient is at low risk for bronchogenic carcinoma, follow-up chest CT at 6-12 months is recommended. This recommendation follows the consensus statement: Guidelines for Management of Small Pulmonary Nodules Detected on CT Scans: A Statement from the Fleischner Society as published in Radiology 2005; 237:395-400.  Variant anatomy of the left vertebral artery and left gastric artery.   Electronically Signed   By: Alcide CleverMark  Lukens M.D.   On: 02/19/2014 17:04    Cardiac Studies:  ECG: SR nonspecific lateral T wave changes   Telemetry:  NSR no arrhythmia  Echo: Reviewed Study Conclusions  - Left ventricle: The cavity size was mildly dilated. Wall thickness was increased in a pattern of mild LVH. The estimated ejection fraction was 50%. Diffuse hypokinesis. Prominent apical trabeculations raise concern for  noncompaction. Features are consistent with a pseudonormal left ventricular filling pattern, with concomitant abnormal relaxation and increased filling pressure (grade 2 diastolic dysfunction). - Aortic valve: Leaflets do not completely coapt. Trileaflet. There was no stenosis. There was severe regurgitation. Diastolic flow reversal in the descending thoracic aorta. - Aorta: Severely dilated ascending aorta. Ascending aorta 6.5 cm. Aortic root dimension: 41 mm (ED). - Mitral valve: There was trivial regurgitation. - Left atrium: The atrium was moderately dilated. - Right ventricle: The cavity size was normal. Systolic function was normal. - Right atrium: The atrium was mildly dilated. - Tricuspid valve: Peak RV-RA gradient (S): 34 mm Hg. - Pulmonary arteries: PA peak pressure: 42 mm Hg (S). - Systemic veins: IVC measured 2.2 cm with > 50% respirophasic variation, suggesting RA pressure 8 mmHg. - Pericardium, extracardiac: Small circumferential pericardial effusion.  Impressions:  - Mildly dilated LV with mild LV hypertrophy. There were prominent apical trabeculations concerning for LV noncompaction. Moderate diastolic dysfunction. Normal  RV size and systolic function. The aortic valve was trileaflet. It showed poor coaptation due to annular dilatation with severe aortic regurgitation. The ascending aorta was dilated to a maximal dimension of 6.5 cm. There was no definite dissection noted. There was a small circumferential pericardial effusion. Mild pulmonary  Medications:   . heparin  5,000 Units Subcutaneous 3 times per day  . lisinopril  5 mg Oral BID  . pantoprazole  40 mg Oral Daily  . sodium chloride  3 mL Intravenous Q12H  . sodium chloride  3 mL Intravenous Q12H       Assessment/Plan:  Aortic Aneurysm:  Long discussion with patient regarding diagnosis , prognosis and rx.  Will try to arrange TEE and right and left cath today per PVT.   Patient wishes To do surgery in December/January  BP well controlled started on ACE  AR:  Murmur on exam  Moderate aortic root injection during cath.  TEE today suspect he will need conduit valve replacement and not valve sparing GERD:  Continue pantoprazole  Lab called orders written for TEE and cath  Charlton Haws 02/20/2014, 9:01 AM

## 2014-02-20 NOTE — Plan of Care (Signed)
Problem: Consults Goal: General Medical Patient Education See Patient Education Module for specific education.  Outcome: Completed/Met Date Met:  02/20/14 Goal: Skin Care Protocol Initiated - if Braden Score 18 or less If consults are not indicated, leave blank or document N/A  Outcome: Not Applicable Date Met:  57/47/34 Goal: Nutrition Consult-if indicated Outcome: Not Applicable Date Met:  03/70/96  Problem: Phase I Progression Outcomes Goal: Initial discharge plan identified Outcome: Completed/Met Date Met:  02/20/14 Goal: Hemodynamically stable Outcome: Completed/Met Date Met:  02/20/14 Goal: Other Phase I Outcomes/Goals Outcome: Completed/Met Date Met:  02/20/14  Problem: Phase II Progression Outcomes Goal: Progress activity as tolerated unless otherwise ordered Outcome: Completed/Met Date Met:  02/20/14

## 2014-02-20 NOTE — H&P (View-Only) (Signed)
Patient ID: Jeffrey [name], male   DOB: 04-12-1965, 49 y.o.   MRN: [id number]    Subjective:  Denies SSCP, palpitations or Dyspnea Wants to do surgery in Dec/Jan  Objective:  Filed Vitals:   02/19/14 1257 02/19/14 1500 02/19/14 2036 02/20/14 0541  BP: 155/75 129/50 123/56 111/54  Pulse:  87 84 79  Temp:  98.8 F (37.1 C) 98.2 F (36.8 C) 98.3 F (36.8 C)  TempSrc:  Oral Oral Oral  Resp:  18    Height:      Weight:    97.387 kg (214 lb 11.2 oz)  SpO2:  100% 97% 96%    Intake/Output from previous day:  Intake/Output Summary (Last 24 hours) at 02/20/14 0901 Last data filed at 02/19/14 2200  Gross per 24 hour  Intake    243 ml  Output      0 ml  Net    243 ml    Physical Exam: Affect appropriate Healthy:  appears stated age HEENT: normal Neck supple with no adenopathy JVP normal no bruits no thyromegaly Lungs clear with no wheezing and good diaphragmatic motion Heart:  S1/S2 AR  murmur, no rub, gallop or click PMI normal Abdomen: benighn, BS positve, no tenderness, no AAA no bruit.  No HSM or HJR Distal pulses intact with no bruits No edema Neuro non-focal Skin warm and dry No muscular weakness   Lab Results: Basic Metabolic Panel:  Recent Labs  40/98/11 0415 02/20/14 0325  NA 137 141  K 4.1 4.1  CL 104 104  CO2 20 21  GLUCOSE 110* 106*  BUN 13 14  CREATININE 0.97 1.14  CALCIUM 8.5 8.8   Liver Function Tests:  Recent Labs  02/19/14 0415 02/20/14 0325  AST 19 18  ALT 22 21  ALKPHOS 71 77  BILITOT 0.4 0.6  PROT 5.6* 6.0  ALBUMIN 3.1* 3.3*    Recent Labs  02/18/14 1814  LIPASE 31   CBC:  Recent Labs  02/19/14 0415 02/20/14 0325  WBC 6.0 6.6  NEUTROABS 3.7 3.9  HGB 13.4 14.2  HCT 39.3 40.5  MCV 81.4 80.7  PLT 174 175   Cardiac Enzymes:  Recent Labs  02/18/14 2208 02/19/14 0415 02/19/14 0851  TROPONINI <0.30 <0.30 <0.30   Hemoglobin A1C:  Recent Labs  02/18/14 2208  HGBA1C 5.8*   Thyroid Function  Tests:  Recent Labs  02/18/14 2208  TSH 2.090    Imaging: Dg Chest 2 View  02/18/2014   CLINICAL DATA:  Shortness of breath for 1 day, abdominal pain  EXAM: CHEST  2 VIEW  COMPARISON:  03/29/2013  FINDINGS: Enlargement of cardiac silhouette with pulmonary vascular congestion.  Mediastinal contours normal.  Question minimal RIGHT perihilar edema with note of a few Kerley B-lines at the lung bases, likely representing minimal pulmonary edema.  No segmental consolidation, pleural effusion or pneumothorax.  Bones unremarkable.  IMPRESSION: Enlargement of cardiac silhouette with pulmonary vascular congestion and suspect minimal pulmonary edema.   Electronically Signed   By: Ulyses Southward M.D.   On: 02/18/2014 18:27   Ct Angio Chest Aortic Dissect W &/or W/o  02/19/2014   CLINICAL DATA:  Cough and shortness of breath, history of aortic aneurysm  EXAM: CT ANGIOGRAPHY CHEST WITH CONTRAST  TECHNIQUE: Multidetector CT imaging of the chest was performed using the standard protocol during bolus administration of intravenous contrast. Multiplanar CT image reconstructions and MIPs were obtained to evaluate the vascular anatomy.  CONTRAST:  OMNIPAQUE IOHEXOL 350 MG/ML SOLN  COMPARISON:  Plain film from earlier in the same day.  FINDINGS: The lungs are well aerated but demonstrate mild emphysematous changes bilaterally. Somewhat linear appearing nodular density is min  Noted in the right upper lobe measuring 10 mm in greatest dimension. It has a sub solid component. This is best seen on the coronal imaging image 66 of series 8. No other parenchymal abnormalities are noted. Small right-sided pleural effusion is seen.  The hilar and mediastinal structures show aneurysmal dilatation of the ascending aorta. The aneurysm arises at the level aortic root. Although not gated for cardiac motion measurement at the sino-tubular junction is approximately 5.4 x 5.5 cm in greatest transverse and AP dimensions. It measures  approximately 6.8 x 7.1 cm in greatest AP and transverse dimensions in the ascending aorta at the level of the main pulmonary artery. It does not appear to significantly involves the arch and the descending aorta tapers in a normal fashion. No dissection is identified the left vertebral artery arises directly from the aortic arch. The origins of the brachiocephalic vessels are within normal limits. No significant coronary calcifications are noted. No right heart strain is seen. Although not timed for pulmonary artery evaluation no definitive pulmonary embolism is seen  The visualized upper abdomen is within normal limits. Some variant anatomy is noted with the left gastric artery arising directly from the aorta. The osseous structures show no acute abnormality.  Review of the MIP images confirms the above findings.  IMPRESSION: No evidence of pulmonary emboli. A small right pleural effusion is noted.  Ascending aortic aneurysm as described above.  Nodular changes within the right upper lobe. If the patient is at high risk for bronchogenic carcinoma, follow-up chest CT at 3-636months is recommended. If the patient is at low risk for bronchogenic carcinoma, follow-up chest CT at 6-12 months is recommended. This recommendation follows the consensus statement: Guidelines for Management of Small Pulmonary Nodules Detected on CT Scans: A Statement from the Fleischner Society as published in Radiology 2005; 237:395-400.  Variant anatomy of the left vertebral artery and left gastric artery.   Electronically Signed   By: Alcide CleverMark  Lukens M.D.   On: 02/19/2014 17:04    Cardiac Studies:  ECG: SR nonspecific lateral T wave changes   Telemetry:  NSR no arrhythmia  Echo: Reviewed Study Conclusions  - Left ventricle: The cavity size was mildly dilated. Wall thickness was increased in a pattern of mild LVH. The estimated ejection fraction was 50%. Diffuse hypokinesis. Prominent apical trabeculations raise concern for  noncompaction. Features are consistent with a pseudonormal left ventricular filling pattern, with concomitant abnormal relaxation and increased filling pressure (grade 2 diastolic dysfunction). - Aortic valve: Leaflets do not completely coapt. Trileaflet. There was no stenosis. There was severe regurgitation. Diastolic flow reversal in the descending thoracic aorta. - Aorta: Severely dilated ascending aorta. Ascending aorta 6.5 cm. Aortic root dimension: 41 mm (ED). - Mitral valve: There was trivial regurgitation. - Left atrium: The atrium was moderately dilated. - Right ventricle: The cavity size was normal. Systolic function was normal. - Right atrium: The atrium was mildly dilated. - Tricuspid valve: Peak RV-RA gradient (S): 34 mm Hg. - Pulmonary arteries: PA peak pressure: 42 mm Hg (S). - Systemic veins: IVC measured 2.2 cm with > 50% respirophasic variation, suggesting RA pressure 8 mmHg. - Pericardium, extracardiac: Small circumferential pericardial effusion.  Impressions:  - Mildly dilated LV with mild LV hypertrophy. There were prominent apical trabeculations concerning for LV noncompaction. Moderate diastolic dysfunction. Normal  RV size and systolic function. The aortic valve was trileaflet. It showed poor coaptation due to annular dilatation with severe aortic regurgitation. The ascending aorta was dilated to a maximal dimension of 6.5 cm. There was no definite dissection noted. There was a small circumferential pericardial effusion. Mild pulmonary  Medications:   . heparin  5,000 Units Subcutaneous 3 times per day  . lisinopril  5 mg Oral BID  . pantoprazole  40 mg Oral Daily  . sodium chloride  3 mL Intravenous Q12H  . sodium chloride  3 mL Intravenous Q12H       Assessment/Plan:  Aortic Aneurysm:  Long discussion with patient regarding diagnosis , prognosis and rx.  Will try to arrange TEE and right and left cath today per PVT.   Patient wishes To do surgery in December/January  BP well controlled started on ACE  AR:  Murmur on exam  Moderate aortic root injection during cath.  TEE today suspect he will need conduit valve replacement and not valve sparing GERD:  Continue pantoprazole  Lab called orders written for TEE and cath  Charlton Haws 02/20/2014, 9:01 AM

## 2014-02-20 NOTE — Progress Notes (Signed)
Echocardiogram Echocardiogram Transesophageal has been performed.  Dorothey Baseman 02/20/2014, 11:28 AM

## 2014-02-20 NOTE — CV Procedure (Signed)
    Cardiac Catheterization Procedure Note  Name: Jeffrey [name] MRN: [id number] DOB: 02/13/1966  Procedure: Right Heart Cath, Left Heart Cath, Selective Coronary Angiography, LV angiography, Ao root angiography  Indication: Severe AI, thoracic aortic aneurysm   Procedural Details: The right groin was prepped, draped, and anesthetized with 1% lidocaine. Using the modified Seldinger technique a 5 French sheath was placed in the right femoral artery and a 7 French sheath was placed in the right femoral vein. A Swan-Ganz catheter was used for the right heart catheterization. Standard protocol was followed for recording of right heart pressures and sampling of oxygen saturations. Fick cardiac output was calculated. Standard Judkins catheters were used for selective coronary angiography and left ventriculography. I could not reach the left main even with a JL-6 catheter. I changed out the 5 Fr sheath for a 6 Fr arterial sheath and used an AL-3 guide catheter to image the native left and right coronary arteries. There were no immediate procedural complications. The patient was transferred to the post catheterization recovery area for further monitoring.  Procedural Findings: Hemodynamics RA 15 RV 69/18 PA 67/32 mean 45 PCWP 42 LV 129/41 AO 130/64 mean 92  Oxygen saturations: PA 65 AO 95  Cardiac Output (Fick) 4.9  Cardiac Index (Fick) 2.3   Coronary angiography: Coronary dominance: right  Left mainstem: No obstructive disease  Left anterior descending (LAD): no obstructive disease, patent to the LV apex  Left circumflex (LCx): large caliber vessel, no obstructive disease  Right coronary artery (RCA): dominant vessel with significant stenosis  Left ventriculography: Left ventricular systolic function is normal, LVEF is estimated at 55%, there is no significant mitral regurgitation   Aortic root angiography: Marked ascending aortic dilatation with severe AI  Estimated Blood  Loss: minimal  Final Conclusions:   1. Widely patent coronary arteries 2. Severely dilated aortic root with severe AI 3. Severely elevated intracardiac filling pressures  Recommendations: continued surgical evaluation for AVR/root replacement. Notes indicate patient's desire to wait on surgery, but cardiac pressures/CHF may require earlier surgical repair.  [name] 02/20/2014, 1:06 PM

## 2014-02-20 NOTE — Interval H&P Note (Signed)
History and Physical Interval Note:  02/20/2014 9:29 AM  Jeffrey [name]  has presented today for surgery, with the diagnosis of aortic anuresym  The various methods of treatment have been discussed with the patient and family. After consideration of risks, benefits and other options for treatment, the patient has consented to  Procedure(s): TRANSESOPHAGEAL ECHOCARDIOGRAM (TEE) (N/A) as a surgical intervention .  The patient's history has been reviewed, patient examined, no change in status, stable for surgery.  I have reviewed the patient's chart and labs.  Questions were answered to the patient's satisfaction.     Lars Masson

## 2014-02-20 NOTE — CV Procedure (Signed)
     Transesophageal Echocardiogram Note  Jeffrey [name] [id number] Mar 25, 1966  Procedure: Transesophageal Echocardiogram Indications: severe AI, ascending aortic aneurysm  Procedure Details Consent: Obtained Time Out: Verified patient identification, verified procedure, site/side was marked, verified correct patient position, special equipment/implants available, Radiology Safety Procedures followed,  medications/allergies/relevent history reviewed, required imaging and test results available.  Performed  Medications: Fentanyl: 50 mcg  Versed: 6 mg  Left Ventrical: The cavity size was mildly dilated. Wall thickness was increased in a pattern of mild LVH. The estimated ejection fraction was mildly reduced with LVEF 45-50%. Diffuse hypokinesis. Prominent apical trabeculations raise concern for noncompaction.    Aortic valve: Leaflets do not completely coapt. Trileaflet. There was no stenosis. There was severe regurgitation.    Aorta: Severely dilated ascending aorta. Ascending aorta 6.9 cm.  Mitral valve: There was mild regurgitation.   Complications: No apparent complications Patient did tolerate procedure well.  Lars Masson, MD, [facility] 02/20/2014, 9:38 AM

## 2014-02-20 NOTE — Progress Notes (Signed)
Site area: RFA/RFV Site Prior to Removal:  Level  0 Pressure Applied For: 23 min Manual:   y Patient Status During Pull:  stable Post Pull Site:  Level  0 Post Pull Instructions Given:  y Post Pull Pulses Present:  palpable Dressing Applied:  clear Bedrest begins @ 1300 Comments: no complications

## 2014-02-20 NOTE — Interval H&P Note (Signed)
History and Physical Interval Note:  02/20/2014 11:28 AM  Jeffrey [name]  has presented today for surgery, with the diagnosis of CP  The various methods of treatment have been discussed with the patient and family. After consideration of risks, benefits and other options for treatment, the patient has consented to  Procedure(s): LEFT AND RIGHT HEART CATHETERIZATION WITH CORONARY ANGIOGRAM (N/A) as a surgical intervention .  The patient's history has been reviewed, patient examined, no change in status, stable for surgery.  I have reviewed the patient's chart and labs.  Questions were answered to the patient's satisfaction.     [name]

## 2014-02-21 ENCOUNTER — Encounter (HOSPITAL_COMMUNITY): Payer: Self-pay | Admitting: Cardiology

## 2014-02-21 ENCOUNTER — Observation Stay (HOSPITAL_COMMUNITY): Payer: BC Managed Care – PPO

## 2014-02-21 DIAGNOSIS — Z791 Long term (current) use of non-steroidal anti-inflammatories (NSAID): Secondary | ICD-10-CM | POA: Diagnosis not present

## 2014-02-21 DIAGNOSIS — I358 Other nonrheumatic aortic valve disorders: Secondary | ICD-10-CM | POA: Diagnosis present

## 2014-02-21 DIAGNOSIS — I313 Pericardial effusion (noninflammatory): Secondary | ICD-10-CM | POA: Diagnosis present

## 2014-02-21 DIAGNOSIS — I712 Thoracic aortic aneurysm, without rupture: Secondary | ICD-10-CM | POA: Diagnosis present

## 2014-02-21 DIAGNOSIS — Z79899 Other long term (current) drug therapy: Secondary | ICD-10-CM | POA: Diagnosis not present

## 2014-02-21 DIAGNOSIS — D62 Acute posthemorrhagic anemia: Secondary | ICD-10-CM | POA: Diagnosis not present

## 2014-02-21 DIAGNOSIS — M109 Gout, unspecified: Secondary | ICD-10-CM | POA: Diagnosis present

## 2014-02-21 DIAGNOSIS — I340 Nonrheumatic mitral (valve) insufficiency: Secondary | ICD-10-CM | POA: Diagnosis present

## 2014-02-21 DIAGNOSIS — R410 Disorientation, unspecified: Secondary | ICD-10-CM | POA: Diagnosis not present

## 2014-02-21 DIAGNOSIS — I5033 Acute on chronic diastolic (congestive) heart failure: Secondary | ICD-10-CM | POA: Diagnosis present

## 2014-02-21 DIAGNOSIS — D696 Thrombocytopenia, unspecified: Secondary | ICD-10-CM | POA: Diagnosis not present

## 2014-02-21 DIAGNOSIS — I351 Nonrheumatic aortic (valve) insufficiency: Secondary | ICD-10-CM | POA: Diagnosis present

## 2014-02-21 DIAGNOSIS — I739 Peripheral vascular disease, unspecified: Secondary | ICD-10-CM | POA: Diagnosis present

## 2014-02-21 DIAGNOSIS — Z01810 Encounter for preprocedural cardiovascular examination: Secondary | ICD-10-CM

## 2014-02-21 DIAGNOSIS — R011 Cardiac murmur, unspecified: Secondary | ICD-10-CM | POA: Diagnosis present

## 2014-02-21 DIAGNOSIS — K219 Gastro-esophageal reflux disease without esophagitis: Secondary | ICD-10-CM | POA: Diagnosis present

## 2014-02-21 LAB — PULMONARY FUNCTION TEST
DL/VA % pred: 110 %
DL/VA: 5.15 ml/min/mmHg/L
DLCO cor % pred: 60 %
DLCO cor: 19.45 ml/min/mmHg
DLCO unc % pred: 59 %
DLCO unc: 19.17 ml/min/mmHg
FEF 25-75 Post: 3.03 L/sec
FEF 25-75 Pre: 1.23 L/sec
FEF2575-%Change-Post: 146 %
FEV1-%Change-Post: 25 %
FEV1-Post: 2.44 L
FEV1-Pre: 1.94 L
FEV1FVC-%Change-Post: 16 %
FEV6-%Change-Post: 7 %
FEV6-Post: 2.91 L
FEV6-Pre: 2.71 L
FVC-%Change-Post: 7 %
FVC-Post: 2.91 L
FVC-Pre: 2.71 L
Post FEV1/FVC ratio: 84 %
Post FEV6/FVC ratio: 100 %
Pre FEV1/FVC ratio: 72 %
Pre FEV6/FVC Ratio: 100 %
RV % pred: 70 %
RV: 1.41 L
TLC % pred: 61 %
TLC: 4.27 L

## 2014-02-21 LAB — CBC WITH DIFFERENTIAL/PLATELET
BASOS ABS: 0 10*3/uL (ref 0.0–0.1)
Basophils Relative: 0 % (ref 0–1)
EOS PCT: 4 % (ref 0–5)
Eosinophils Absolute: 0.3 10*3/uL (ref 0.0–0.7)
HCT: 41.6 % (ref 39.0–52.0)
Hemoglobin: 14.1 g/dL (ref 13.0–17.0)
LYMPHS PCT: 16 % (ref 12–46)
Lymphs Abs: 1.1 10*3/uL (ref 0.7–4.0)
MCH: 27.4 pg (ref 26.0–34.0)
MCHC: 33.9 g/dL (ref 30.0–36.0)
MCV: 80.9 fL (ref 78.0–100.0)
MONO ABS: 0.6 10*3/uL (ref 0.1–1.0)
Monocytes Relative: 9 % (ref 3–12)
NEUTROS ABS: 4.7 10*3/uL (ref 1.7–7.7)
Neutrophils Relative %: 71 % (ref 43–77)
PLATELETS: 157 10*3/uL (ref 150–400)
RBC: 5.14 MIL/uL (ref 4.22–5.81)
RDW: 13.6 % (ref 11.5–15.5)
WBC: 6.6 10*3/uL (ref 4.0–10.5)

## 2014-02-21 LAB — COMPREHENSIVE METABOLIC PANEL
ALK PHOS: 78 U/L (ref 39–117)
ALT: 19 U/L (ref 0–53)
ANION GAP: 13 (ref 5–15)
AST: 19 U/L (ref 0–37)
Albumin: 3.3 g/dL — ABNORMAL LOW (ref 3.5–5.2)
BILIRUBIN TOTAL: 0.7 mg/dL (ref 0.3–1.2)
BUN: 12 mg/dL (ref 6–23)
CHLORIDE: 104 meq/L (ref 96–112)
CO2: 22 meq/L (ref 19–32)
CREATININE: 1.04 mg/dL (ref 0.50–1.35)
Calcium: 8.9 mg/dL (ref 8.4–10.5)
GFR calc Af Amer: >90 mL/min (ref >90)
GFR, EST NON AFRICAN AMERICAN: 83 mL/min — AB (ref >90)
GLUCOSE: 104 mg/dL — AB (ref 70–99)
POTASSIUM: 4.5 meq/L (ref 3.7–5.3)
Sodium: 139 mEq/L (ref 137–147)
Total Protein: 6 g/dL (ref 6.0–8.3)

## 2014-02-21 MED ORDER — ALBUTEROL SULFATE (2.5 MG/3ML) 0.083% IN NEBU
2.5000 mg | INHALATION_SOLUTION | Freq: Once | RESPIRATORY_TRACT | Status: AC
  Administered 2014-02-21: 2.5 mg via RESPIRATORY_TRACT

## 2014-02-21 NOTE — Progress Notes (Signed)
Patient ID: Jeffrey [name], male   DOB: [birth year], 48 y.o.   MRN: [id number]    Subjective:  Denies SSCP, palpitations or Dyspnea Staying for surgery with PVT Monday  Just came back from PFT;s  Objective:  Filed Vitals:   02/20/14 1720 02/20/14 1820 02/20/14 2013 02/21/14 0538  BP: 127/63 131/59 140/64 138/62  Pulse: 82 81 84 80  Temp:   98.5 F (36.9 C) 97.9 F (36.6 C)  TempSrc:   Oral Oral  Resp: 23 24    Height:      Weight:    97.387 kg (214 lb 11.2 oz)  SpO2: 98% 98% 97% 97%    Intake/Output from previous day:  Intake/Output Summary (Last 24 hours) at 02/21/14 0851 Last data filed at 02/20/14 2200  Gross per 24 hour  Intake    243 ml  Output      0 ml  Net    243 ml    Physical Exam: Affect appropriate Healthy:  appears stated age HEENT: normal Neck supple with no adenopathy JVP normal no bruits no thyromegaly Lungs clear with no wheezing and good diaphragmatic motion Heart:  S1/S2 AR  murmur, no rub, gallop or click PMI normal Abdomen: benighn, BS positve, no tenderness, no AAA no bruit.  No HSM or HJR Distal pulses intact with no bruits No edema Neuro non-focal Skin warm and dry No muscular weakness RFA cath sight A no hematoma   Lab Results: Basic Metabolic Panel:  Recent Labs  11/91/47 0325 02/21/14 0528  NA 141 139  K 4.1 4.5  CL 104 104  CO2 21 22  GLUCOSE 106* 104*  BUN 14 12  CREATININE 1.14 1.04  CALCIUM 8.8 8.9   Liver Function Tests:  Recent Labs  02/20/14 0325 02/21/14 0528  AST 18 19  ALT 21 19  ALKPHOS 77 78  BILITOT 0.6 0.7  PROT 6.0 6.0  ALBUMIN 3.3* 3.3*    Recent Labs  02/18/14 1814  LIPASE 31   CBC:  Recent Labs  02/20/14 0325 02/21/14 0528  WBC 6.6 6.6  NEUTROABS 3.9 4.7  HGB 14.2 14.1  HCT 40.5 41.6  MCV 80.7 80.9  PLT 175 157   Cardiac Enzymes:  Recent Labs  02/18/14 2208 02/19/14 0415 02/19/14 0851  TROPONINI <0.30 <0.30 <0.30   Hemoglobin A1C:  Recent Labs  02/18/14 2208    HGBA1C 5.8*   Thyroid Function Tests:  Recent Labs  02/18/14 2208  TSH 2.090    Imaging: Dg Orthopantogram  02/20/2014   CLINICAL DATA:  Pre-op for Bentall procedure. Aortic insufficiency. Initial encounter.  EXAM: ORTHOPANTOGRAM/PANORAMIC  COMPARISON:  None.  FINDINGS: There are apparent incompletely erupted mandibular teeth bilaterally. No significant periodontal disease is identified. Midline artifact typical of the Panorex technique is noted.  IMPRESSION: No significant periodontal disease demonstrated.   Electronically Signed   By: Roxy Horseman M.D.   On: 02/20/2014 21:18   Ct Angio Chest Aortic Dissect W &/or W/o  02/19/2014   CLINICAL DATA:  Cough and shortness of breath, history of aortic aneurysm  EXAM: CT ANGIOGRAPHY CHEST WITH CONTRAST  TECHNIQUE: Multidetector CT imaging of the chest was performed using the standard protocol during bolus administration of intravenous contrast. Multiplanar CT image reconstructions and MIPs were obtained to evaluate the vascular anatomy.  CONTRAST:  OMNIPAQUE IOHEXOL 350 MG/ML SOLN  COMPARISON:  Plain film from earlier in the same day.  FINDINGS: The lungs are well aerated but demonstrate mild emphysematous changes bilaterally. Somewhat  linear appearing nodular density is min  Noted in the right upper lobe measuring 10 mm in greatest dimension. It has a sub solid component. This is best seen on the coronal imaging image 66 of series 8. No other parenchymal abnormalities are noted. Small right-sided pleural effusion is seen.  The hilar and mediastinal structures show aneurysmal dilatation of the ascending aorta. The aneurysm arises at the level aortic root. Although not gated for cardiac motion measurement at the sino-tubular junction is approximately 5.4 x 5.5 cm in greatest transverse and AP dimensions. It measures approximately 6.8 x 7.1 cm in greatest AP and transverse dimensions in the ascending aorta at the level of the main pulmonary artery.  It does not appear to significantly involves the arch and the descending aorta tapers in a normal fashion. No dissection is identified the left vertebral artery arises directly from the aortic arch. The origins of the brachiocephalic vessels are within normal limits. No significant coronary calcifications are noted. No right heart strain is seen. Although not timed for pulmonary artery evaluation no definitive pulmonary embolism is seen  The visualized upper abdomen is within normal limits. Some variant anatomy is noted with the left gastric artery arising directly from the aorta. The osseous structures show no acute abnormality.  Review of the MIP images confirms the above findings.  IMPRESSION: No evidence of pulmonary emboli. A small right pleural effusion is noted.  Ascending aortic aneurysm as described above.  Nodular changes within the right upper lobe. If the patient is at high risk for bronchogenic carcinoma, follow-up chest CT at 3-50months is recommended. If the patient is at low risk for bronchogenic carcinoma, follow-up chest CT at 6-12 months is recommended. This recommendation follows the consensus statement: Guidelines for Management of Small Pulmonary Nodules Detected on CT Scans: A Statement from the Fleischner Society as published in Radiology 2005; 237:395-400.  Variant anatomy of the left vertebral artery and left gastric artery.   Electronically Signed   By: Alcide Clever M.D.   On: 02/19/2014 17:04    Cardiac Studies:  ECG: SR nonspecific lateral T wave changes   Telemetry:  NSR no arrhythmia  Echo: Reviewed Study Conclusions  - Left ventricle: The cavity size was mildly dilated. Wall thickness was increased in a pattern of mild LVH. The estimated ejection fraction was 50%. Diffuse hypokinesis. Prominent apical trabeculations raise concern for noncompaction. Features are consistent with a pseudonormal left ventricular filling pattern, with concomitant abnormal relaxation  and increased filling pressure (grade 2 diastolic dysfunction). - Aortic valve: Leaflets do not completely coapt. Trileaflet. There was no stenosis. There was severe regurgitation. Diastolic flow reversal in the descending thoracic aorta. - Aorta: Severely dilated ascending aorta. Ascending aorta 6.5 cm. Aortic root dimension: 41 mm (ED). - Mitral valve: There was trivial regurgitation. - Left atrium: The atrium was moderately dilated. - Right ventricle: The cavity size was normal. Systolic function was normal. - Right atrium: The atrium was mildly dilated. - Tricuspid valve: Peak RV-RA gradient (S): 34 mm Hg. - Pulmonary arteries: PA peak pressure: 42 mm Hg (S). - Systemic veins: IVC measured 2.2 cm with > 50% respirophasic variation, suggesting RA pressure 8 mmHg. - Pericardium, extracardiac: Small circumferential pericardial effusion.  Impressions:  - Mildly dilated LV with mild LV hypertrophy. There were prominent apical trabeculations concerning for LV noncompaction. Moderate diastolic dysfunction. Normal RV size and systolic function. The aortic valve was trileaflet. It showed poor coaptation due to annular dilatation with severe aortic regurgitation. The ascending  aorta was dilated to a maximal dimension of 6.5 cm. There was no definite dissection noted. There was a small circumferential pericardial effusion. Mild pulmonary  Medications:   . heparin  5,000 Units Subcutaneous 3 times per day  . lisinopril  5 mg Oral BID  . pantoprazole  40 mg Oral Daily  . sodium chloride  3 mL Intravenous Q12H  . sodium chloride  3 mL Intravenous Q12H  . sodium chloride  3 mL Intravenous Q12H       Assessment/Plan:  Aortic Aneurysm:  Over 6 cm for root replacement Monday with PVT   AR:  Severe by TEE with EDP 34  Main motivation to do surgery early and not delay Continue ACE GERD:  Continue pantoprazole  For aortic root replacement and AVR Monday with  PVT All preop studies being done   [facility] Jaidan Stachnik 02/21/2014, 8:51 AM

## 2014-02-21 NOTE — Progress Notes (Signed)
Jeffrey [name] VHQ:[id number] DOB: 06-Feb-1966 DOA: 02/18/2014 PCP: Beverley Fiedler, MD  Brief narrative: 48 y/o ? Gout, recent h/p brocnhtiis presented to [facility] 02/18/14 with bloating and abd discomfort and persistent cough. He was found to have severe Aortic dilatation, and AoV insuff and recommended to have surgery for this  Past medical history-As per Problem list Chart reviewed as below- reviewed  Consultants:   Cardiology  CT surgery  Procedures:  Ct angio  Antibiotics:  none   Subjective   No specific issues. tol diet No cp sob  No fever or chills   Objective    Interim History:   Telemetry:    Objective: Filed Vitals:   02/20/14 1820 02/20/14 2013 02/21/14 0538 02/21/14 0950  BP: 131/59 140/64 138/62 127/58  Pulse: 81 84 80 81  Temp:  98.5 F (36.9 C) 97.9 F (36.6 C) 98.2 F (36.8 C)  TempSrc:  Oral Oral Oral  Resp: 24   20  Height:      Weight:   97.387 kg (214 lb 11.2 oz)   SpO2: 98% 97% 97% 99%    Intake/Output Summary (Last 24 hours) at 02/21/14 1251 Last data filed at 02/21/14 0959  Gross per 24 hour  Intake    246 ml  Output      0 ml  Net    246 ml    Exam:  General: eomi ncat Cardiovascular: s1 s2 no m/r/g Respiratory: clear Abd: soft, Nt/ND   Data Reviewed: Basic Metabolic Panel:  Recent Labs Lab 02/18/14 1814 02/18/14 2208 02/19/14 0415 02/20/14 0325 02/21/14 0528  NA 139  --  137 141 139  K 4.7  --  4.1 4.1 4.5  CL 105  --  104 104 104  CO2 21  --  20 21 22   GLUCOSE 101*  --  110* 106* 104*  BUN 11  --  13 14 12   CREATININE 0.91 0.97 0.97 1.14 1.04  CALCIUM 9.1  --  8.5 8.8 8.9   Liver Function Tests:  Recent Labs Lab 02/18/14 1814 02/19/14 0415 02/20/14 0325 02/21/14 0528  AST 21 19 18 19   ALT 27 22 21 19   ALKPHOS 77 71 77 78  BILITOT 0.6 0.4 0.6 0.7  PROT 6.2 5.6* 6.0 6.0  ALBUMIN 3.5 3.1* 3.3* 3.3*    Recent Labs Lab 02/18/14 1814  LIPASE 31   No results for input(s): AMMONIA in  the last 168 hours. CBC:  Recent Labs Lab 02/18/14 1814 02/18/14 2208 02/19/14 0415 02/20/14 0325 02/21/14 0528  WBC 7.6 6.8 6.0 6.6 6.6  NEUTROABS 5.7  --  3.7 3.9 4.7  HGB 14.1 13.8 13.4 14.2 14.1  HCT 41.7 40.1 39.3 40.5 41.6  MCV 81.1 80.5 81.4 80.7 80.9  PLT 177 172 174 175 157   Cardiac Enzymes:  Recent Labs Lab 02/18/14 1814 02/18/14 1818 02/18/14 2208 02/19/14 0415 02/19/14 0851  TROPONINI <0.30 <0.30 <0.30 <0.30 <0.30   BNP: Invalid input(s): POCBNP CBG: No results for input(s): GLUCAP in the last 168 hours.  No results found for this or any previous visit (from the past 240 hour(s)).   Studies:              All Imaging reviewed and is as per above notation   Scheduled Meds: . heparin  5,000 Units Subcutaneous 3 times per day  . lisinopril  5 mg Oral BID  . pantoprazole  40 mg Oral Daily  . sodium chloride  3 mL Intravenous Q12H  .  sodium chloride  3 mL Intravenous Q12H  . sodium chloride  3 mL Intravenous Q12H   Continuous Infusions:    Assessment/Plan:  1. Acute decompensated diastolic chf per Echo 02/19/14 2/2 to severe AoV regurgitation-Appreciate Cardiology and CT surgery input.  Lasix on hold  Cont Lisinopril 5 mg bid.  Hold B blocker for now.   2. Cough-Rpt CXR.  Might need more lasix dependant on result-unlikely infectious etiology 3. Ascending AoV aneurysm dilatation-CT chest confirms dilatation.  TEE AoV 6.9 cm enlargement.  Cath confirms dilation.  Orthopantogram neg, PFT's done 11/13 are pending.   4. Gerd-continue Pantoprazole 5. Possible pre-DM-Out pt reassesment  Code Status: Full Family Communication:  D/w patient Disposition Plan: inpatient   Pleas KochJai Domani Bakos, MD  [facility] Pager [phone] 02/21/2014, 12:51 PM    LOS: 3 days

## 2014-02-21 NOTE — Plan of Care (Signed)
Problem: Discharge Progression Outcomes Goal: Discharge plan in place and appropriate Outcome: Progressing Patient scheduled for surgery on Monday. Discharge pending surgery. Goal: Pain controlled with appropriate interventions Outcome: Progressing Pain free Goal: Hemodynamically stable Outcome: Progressing VSS Goal: Tolerating diet Outcome: Progressing

## 2014-02-21 NOTE — Progress Notes (Signed)
Pt ambulating in room without problems. Sts he will walk on his own, did not want to walk with me. Discussed sternal precautions, mobility, IS and surgery timeline. Gave OHS booklet and guideline. Set up pre-op video before I left. Pt and wife with many questions. Anxious to talk with surgeon. Encouraged them to read book, which he was already doing as I left. [id number] Ethelda Chick CES, ACSM 2:46 PM 02/21/2014

## 2014-02-22 LAB — CBC WITH DIFFERENTIAL/PLATELET
BASOS PCT: 0 % (ref 0–1)
Basophils Absolute: 0 10*3/uL (ref 0.0–0.1)
Eosinophils Absolute: 0.3 10*3/uL (ref 0.0–0.7)
Eosinophils Relative: 5 % (ref 0–5)
HEMATOCRIT: 40 % (ref 39.0–52.0)
HEMOGLOBIN: 13.4 g/dL (ref 13.0–17.0)
LYMPHS ABS: 1.3 10*3/uL (ref 0.7–4.0)
Lymphocytes Relative: 22 % (ref 12–46)
MCH: 27.7 pg (ref 26.0–34.0)
MCHC: 33.5 g/dL (ref 30.0–36.0)
MCV: 82.6 fL (ref 78.0–100.0)
MONO ABS: 0.5 10*3/uL (ref 0.1–1.0)
MONOS PCT: 8 % (ref 3–12)
NEUTROS ABS: 3.7 10*3/uL (ref 1.7–7.7)
Neutrophils Relative %: 65 % (ref 43–77)
Platelets: 144 10*3/uL — ABNORMAL LOW (ref 150–400)
RBC: 4.84 MIL/uL (ref 4.22–5.81)
RDW: 13.7 % (ref 11.5–15.5)
WBC: 5.8 10*3/uL (ref 4.0–10.5)

## 2014-02-22 LAB — COMPREHENSIVE METABOLIC PANEL
ALBUMIN: 3.2 g/dL — AB (ref 3.5–5.2)
ALK PHOS: 73 U/L (ref 39–117)
ALT: 17 U/L (ref 0–53)
ANION GAP: 13 (ref 5–15)
AST: 19 U/L (ref 0–37)
BUN: 11 mg/dL (ref 6–23)
CO2: 21 mEq/L (ref 19–32)
Calcium: 8.7 mg/dL (ref 8.4–10.5)
Chloride: 103 mEq/L (ref 96–112)
Creatinine, Ser: 0.98 mg/dL (ref 0.50–1.35)
GFR calc Af Amer: >90 mL/min (ref >90)
GFR calc non Af Amer: >90 mL/min (ref >90)
Glucose, Bld: 96 mg/dL (ref 70–99)
POTASSIUM: 4.5 meq/L (ref 3.7–5.3)
SODIUM: 137 meq/L (ref 137–147)
Total Bilirubin: 0.6 mg/dL (ref 0.3–1.2)
Total Protein: 5.7 g/dL — ABNORMAL LOW (ref 6.0–8.3)

## 2014-02-22 NOTE — Progress Notes (Signed)
SUBJECTIVE:  No complaints  OBJECTIVE:   Vitals:   Filed Vitals:   02/21/14 1520 02/21/14 2007 02/22/14 0434 02/22/14 1042  BP: 131/57 125/69 128/58 120/64  Pulse: 81 81 72 84  Temp: 98 F (36.7 C) 97.8 F (36.6 C) 97.7 F (36.5 C)   TempSrc: Oral Oral Oral   Resp: 17 18 18    Height:      Weight:   211 lb (95.709 kg)   SpO2: 100% 100% 100% 98%   I&O's:   Intake/Output Summary (Last 24 hours) at 02/22/14 1116 Last data filed at 02/22/14 1046  Gross per 24 hour  Intake      3 ml  Output   2976 ml  Net  -2973 ml   TELEMETRY: Reviewed telemetry pt in NSR:     PHYSICAL EXAM General: Well developed, well nourished, in no acute distress Head: Eyes PERRLA, No xanthomas.   Normal cephalic and atramatic  Lungs:   Clear bilaterally to auscultation and percussion. Heart:   HRRR S1 S2 Pulses are 2+ & equal.2/6 SM at RUSB to LLSB Abdomen: Bowel sounds are positive, abdomen soft and non-tender without masses Extremities:   No clubbing, cyanosis or edema.  DP +1 Neuro: Alert and oriented X 3. Psych:  Good affect, responds appropriately   LABS: Basic Metabolic Panel:  Recent Labs  40/98/1111/13/15 0528 02/22/14 0414  NA 139 137  K 4.5 4.5  CL 104 103  CO2 22 21  GLUCOSE 104* 96  BUN 12 11  CREATININE 1.04 0.98  CALCIUM 8.9 8.7   Liver Function Tests:  Recent Labs  02/21/14 0528 02/22/14 0414  AST 19 19  ALT 19 17  ALKPHOS 78 73  BILITOT 0.7 0.6  PROT 6.0 5.7*  ALBUMIN 3.3* 3.2*   No results for input(s): LIPASE, AMYLASE in the last 72 hours. CBC:  Recent Labs  02/21/14 0528 02/22/14 0414  WBC 6.6 5.8  NEUTROABS 4.7 3.7  HGB 14.1 13.4  HCT 41.6 40.0  MCV 80.9 82.6  PLT 157 144*   Cardiac Enzymes: No results for input(s): CKTOTAL, CKMB, CKMBINDEX, TROPONINI in the last 72 hours. BNP: Invalid input(s): POCBNP D-Dimer: No results for input(s): DDIMER in the last 72 hours. Hemoglobin A1C: No results for input(s): HGBA1C in the last 72 hours. Fasting  Lipid Panel: No results for input(s): CHOL, HDL, LDLCALC, TRIG, CHOLHDL, LDLDIRECT in the last 72 hours. Thyroid Function Tests: No results for input(s): TSH, T4TOTAL, T3FREE, THYROIDAB in the last 72 hours.  Invalid input(s): FREET3 Anemia Panel: No results for input(s): VITAMINB12, FOLATE, FERRITIN, TIBC, IRON, RETICCTPCT in the last 72 hours. Coag Panel:   No results found for: INR, PROTIME  RADIOLOGY: Dg Orthopantogram  02/20/2014   CLINICAL DATA:  Pre-op for Bentall procedure. Aortic insufficiency. Initial encounter.  EXAM: ORTHOPANTOGRAM/PANORAMIC  COMPARISON:  None.  FINDINGS: There are apparent incompletely erupted mandibular teeth bilaterally. No significant periodontal disease is identified. Midline artifact typical of the Panorex technique is noted.  IMPRESSION: No significant periodontal disease demonstrated.   Electronically Signed   By: Roxy HorsemanBill  Veazey M.D.   On: 02/20/2014 21:18   Dg Chest 2 View  02/18/2014   CLINICAL DATA:  Shortness of breath for 1 day, abdominal pain  EXAM: CHEST  2 VIEW  COMPARISON:  03/29/2013  FINDINGS: Enlargement of cardiac silhouette with pulmonary vascular congestion.  Mediastinal contours normal.  Question minimal RIGHT perihilar edema with note of a few Kerley B-lines at the lung bases, likely representing minimal  pulmonary edema.  No segmental consolidation, pleural effusion or pneumothorax.  Bones unremarkable.  IMPRESSION: Enlargement of cardiac silhouette with pulmonary vascular congestion and suspect minimal pulmonary edema.   Electronically Signed   By: Ulyses Southward M.D.   On: 02/18/2014 18:27   Ct Angio Chest Aortic Dissect W &/or W/o  02/19/2014   CLINICAL DATA:  Cough and shortness of breath, history of aortic aneurysm  EXAM: CT ANGIOGRAPHY CHEST WITH CONTRAST  TECHNIQUE: Multidetector CT imaging of the chest was performed using the standard protocol during bolus administration of intravenous contrast. Multiplanar CT image reconstructions and  MIPs were obtained to evaluate the vascular anatomy.  CONTRAST:  OMNIPAQUE IOHEXOL 350 MG/ML SOLN  COMPARISON:  Plain film from earlier in the same day.  FINDINGS: The lungs are well aerated but demonstrate mild emphysematous changes bilaterally. Somewhat linear appearing nodular density is min  Noted in the right upper lobe measuring 10 mm in greatest dimension. It has a sub solid component. This is best seen on the coronal imaging image 66 of series 8. No other parenchymal abnormalities are noted. Small right-sided pleural effusion is seen.  The hilar and mediastinal structures show aneurysmal dilatation of the ascending aorta. The aneurysm arises at the level aortic root. Although not gated for cardiac motion measurement at the sino-tubular junction is approximately 5.4 x 5.5 cm in greatest transverse and AP dimensions. It measures approximately 6.8 x 7.1 cm in greatest AP and transverse dimensions in the ascending aorta at the level of the main pulmonary artery. It does not appear to significantly involves the arch and the descending aorta tapers in a normal fashion. No dissection is identified the left vertebral artery arises directly from the aortic arch. The origins of the brachiocephalic vessels are within normal limits. No significant coronary calcifications are noted. No right heart strain is seen. Although not timed for pulmonary artery evaluation no definitive pulmonary embolism is seen  The visualized upper abdomen is within normal limits. Some variant anatomy is noted with the left gastric artery arising directly from the aorta. The osseous structures show no acute abnormality.  Review of the MIP images confirms the above findings.  IMPRESSION: No evidence of pulmonary emboli. A small right pleural effusion is noted.  Ascending aortic aneurysm as described above.  Nodular changes within the right upper lobe. If the patient is at high risk for bronchogenic carcinoma, follow-up chest CT at  3-21months is recommended. If the patient is at low risk for bronchogenic carcinoma, follow-up chest CT at 6-12 months is recommended. This recommendation follows the consensus statement: Guidelines for Management of Small Pulmonary Nodules Detected on CT Scans: A Statement from the Fleischner Society as published in Radiology 2005; 237:395-400.  Variant anatomy of the left vertebral artery and left gastric artery.   Electronically Signed   By: Alcide Clever M.D.   On: 02/19/2014 17:04   Assessment/Plan:  Aortic Aneurysm: Over 6 cm for root replacement Monday with CVTS  AR: Severe by TEE with markedly elevated  LEDP 34at cath which is ain motivation to do surgery early and not delay. Continue ACE.  GERD: Continue pantoprazole Normal Coronary Arteries by cath  For aortic root replacement and AVR Monday with  All preop studies being done      Quintella Reichert, MD  02/22/2014  11:16 AM

## 2014-02-22 NOTE — Progress Notes (Signed)
Pre-op Cardiac Surgery  Carotid Findings:  1-39% ICA stenosis.    Upper Extremity Right Left  Brachial Pressures    Radial Waveforms T T  Ulnar Waveforms T T  Palmar Arch (Allen's Test) Dopplers signal remains normal with radial compression and diminishes 50% with ulnar compression Doppler signal remains normal with radial compression and obliterates with ulnar compression   Findings:      Lower  Extremity Right Left  Dorsalis Pedis    Anterior Tibial    Posterior Tibial    Ankle/Brachial Indices      Findings:

## 2014-02-22 NOTE — Plan of Care (Signed)
Problem: Discharge Progression Outcomes Goal: Discharge plan in place and appropriate Outcome: Progressing Awaiting surgery Monday Goal: Hemodynamically stable Outcome: Progressing VSS Goal: Tolerating diet Outcome: Progressing Patient tolerating diet Goal: Activity appropriate for discharge plan Outcome: Progressing Up ad lib

## 2014-02-22 NOTE — Progress Notes (Signed)
Jeffrey [name] IDH:[id number] DOB: 03-06-1966 DOA: 02/18/2014 PCP: Jeffrey Fiedler, MD  Brief narrative: 48 y/o ? Gout, recent h/p brocnhtiis presented to [facility] 02/18/14 with bloating and abd discomfort and persistent cough. He was found to have severe Aortic dilatation, and AoV insuff and recommended to have surgery for this  Past medical history-As per Problem list Chart reviewed as below- reviewed  Consultants:   Cardiology  CT surgery  Procedures:  Ct angio  Antibiotics:  none   Subjective   Continues to do well Cough gone No n/v/cp eating and drinking ok Multiple quesitons about procedure   Objective    Interim History:   Telemetry:    Objective: Filed Vitals:   02/21/14 1520 02/21/14 2007 02/22/14 0434 02/22/14 1042  BP: 131/57 125/69 128/58 120/64  Pulse: 81 81 72 84  Temp: 98 F (36.7 C) 97.8 F (36.6 C) 97.7 F (36.5 C)   TempSrc: Oral Oral Oral   Resp: 17 18 18    Height:      Weight:   95.709 kg (211 lb)   SpO2: 100% 100% 100% 98%    Intake/Output Summary (Last 24 hours) at 02/22/14 1304 Last data filed at 02/22/14 1046  Gross per 24 hour  Intake      3 ml  Output   2976 ml  Net  -2973 ml    Exam:  General: eomi ncat Cardiovascular: s1 s2 no m/r/g Respiratory: clear Abd: soft, Nt/ND   Data Reviewed: Basic Metabolic Panel:  Recent Labs Lab 02/18/14 1814 02/18/14 2208 02/19/14 0415 02/20/14 0325 02/21/14 0528 02/22/14 0414  NA 139  --  137 141 139 137  K 4.7  --  4.1 4.1 4.5 4.5  CL 105  --  104 104 104 103  CO2 21  --  20 21 22 21   GLUCOSE 101*  --  110* 106* 104* 96  BUN 11  --  13 14 12 11   CREATININE 0.91 0.97 0.97 1.14 1.04 0.98  CALCIUM 9.1  --  8.5 8.8 8.9 8.7   Liver Function Tests:  Recent Labs Lab 02/18/14 1814 02/19/14 0415 02/20/14 0325 02/21/14 0528 02/22/14 0414  AST 21 19 18 19 19   ALT 27 22 21 19 17   ALKPHOS 77 71 77 78 73  BILITOT 0.6 0.4 0.6 0.7 0.6  PROT 6.2 5.6* 6.0 6.0 5.7*    ALBUMIN 3.5 3.1* 3.3* 3.3* 3.2*    Recent Labs Lab 02/18/14 1814  LIPASE 31   No results for input(s): AMMONIA in the last 168 hours. CBC:  Recent Labs Lab 02/18/14 1814 02/18/14 2208 02/19/14 0415 02/20/14 0325 02/21/14 0528 02/22/14 0414  WBC 7.6 6.8 6.0 6.6 6.6 5.8  NEUTROABS 5.7  --  3.7 3.9 4.7 3.7  HGB 14.1 13.8 13.4 14.2 14.1 13.4  HCT 41.7 40.1 39.3 40.5 41.6 40.0  MCV 81.1 80.5 81.4 80.7 80.9 82.6  PLT 177 172 174 175 157 144*   Cardiac Enzymes:  Recent Labs Lab 02/18/14 1814 02/18/14 1818 02/18/14 2208 02/19/14 0415 02/19/14 0851  TROPONINI <0.30 <0.30 <0.30 <0.30 <0.30   BNP: Invalid input(s): POCBNP CBG: No results for input(s): GLUCAP in the last 168 hours.  No results found for this or any previous visit (from the past 240 hour(s)).   Studies:              All Imaging reviewed and is as per above notation   Scheduled Meds: . lisinopril  5 mg Oral BID  . pantoprazole  40  mg Oral Daily  . sodium chloride  3 mL Intravenous Q12H  . sodium chloride  3 mL Intravenous Q12H  . sodium chloride  3 mL Intravenous Q12H   Continuous Infusions:    Assessment/Plan:  1. Acute decompensated diastolic chf per Echo 02/19/14 2/2 to severe AoV regurgitation-Appreciate Cardiology and CT surgery input.  Lasix on hold  Cont Lisinopril 5 mg bid.  Hold B blocker for now.  2. Cough-resolved.  No further work-up.  afebrile 3. Ascending AoV aneurysm dilatation-CT chest confirms dilatation.  TEE AoV 6.9 cm enlargement.  Cath confirms dilation.  Orthopantogram neg, PFT's done 11/13 4. Gerd-continue Pantoprazole 5. Possible pre-DM-Out pt reassesment  DVT-SCd [ambulatory and doesn;t like heparing] Tele Dc  Code Status: Full Family Communication:  D/w patient Disposition Plan: inpatient   Pleas KochJai [name], MD  [facility] Pager [phone] 02/22/2014, 1:04 PM    LOS: 4 days

## 2014-02-22 NOTE — Progress Notes (Signed)
In to walk with patient. Patient sitting up in chair. Stated his cardiac monitor has been disconnected and he has been walking back and forth to the solarium. He was unable to walk with me because his 48 year old son was asleep in the bed and his wife had left to go get something to eat within the hospital. Patient had several questions about his surgery. Reviewed the open heart surgery book with him and answered his questions. Wife returned during visit and denied further questions. Discussed with him that cardiac rehab would return to work with him after he was transferred out of the intensive care unit.

## 2014-02-23 DIAGNOSIS — I428 Other cardiomyopathies: Secondary | ICD-10-CM | POA: Insufficient documentation

## 2014-02-23 DIAGNOSIS — I509 Heart failure, unspecified: Secondary | ICD-10-CM

## 2014-02-23 LAB — COMPREHENSIVE METABOLIC PANEL
ALT: 19 U/L (ref 0–53)
AST: 21 U/L (ref 0–37)
Albumin: 3.2 g/dL — ABNORMAL LOW (ref 3.5–5.2)
Alkaline Phosphatase: 77 U/L (ref 39–117)
Anion gap: 11 (ref 5–15)
BUN: 10 mg/dL (ref 6–23)
CHLORIDE: 104 meq/L (ref 96–112)
CO2: 23 mEq/L (ref 19–32)
CREATININE: 1.04 mg/dL (ref 0.50–1.35)
Calcium: 9.1 mg/dL (ref 8.4–10.5)
GFR, EST NON AFRICAN AMERICAN: 83 mL/min — AB (ref >90)
GLUCOSE: 99 mg/dL (ref 70–99)
Potassium: 5.2 mEq/L (ref 3.7–5.3)
Sodium: 138 mEq/L (ref 137–147)
Total Bilirubin: 0.5 mg/dL (ref 0.3–1.2)
Total Protein: 6.1 g/dL (ref 6.0–8.3)

## 2014-02-23 LAB — BASIC METABOLIC PANEL
Anion gap: 11 (ref 5–15)
BUN: 10 mg/dL (ref 6–23)
CO2: 23 mEq/L (ref 19–32)
Calcium: 9.4 mg/dL (ref 8.4–10.5)
Chloride: 103 mEq/L (ref 96–112)
Creatinine, Ser: 1.05 mg/dL (ref 0.50–1.35)
GFR calc Af Amer: >90 mL/min (ref >90)
GFR calc non Af Amer: 82 mL/min — ABNORMAL LOW (ref >90)
Glucose, Bld: 118 mg/dL — ABNORMAL HIGH (ref 70–99)
Potassium: 5 mEq/L (ref 3.7–5.3)
Sodium: 137 mEq/L (ref 137–147)

## 2014-02-23 LAB — CBC WITH DIFFERENTIAL/PLATELET
BASOS PCT: 0 % (ref 0–1)
Basophils Absolute: 0 10*3/uL (ref 0.0–0.1)
Eosinophils Absolute: 0.4 10*3/uL (ref 0.0–0.7)
Eosinophils Relative: 6 % — ABNORMAL HIGH (ref 0–5)
HCT: 41.8 % (ref 39.0–52.0)
Hemoglobin: 13.9 g/dL (ref 13.0–17.0)
LYMPHS PCT: 26 % (ref 12–46)
Lymphs Abs: 1.4 10*3/uL (ref 0.7–4.0)
MCH: 27.9 pg (ref 26.0–34.0)
MCHC: 33.3 g/dL (ref 30.0–36.0)
MCV: 83.8 fL (ref 78.0–100.0)
Monocytes Absolute: 0.5 10*3/uL (ref 0.1–1.0)
Monocytes Relative: 9 % (ref 3–12)
Neutro Abs: 3.2 10*3/uL (ref 1.7–7.7)
Neutrophils Relative %: 59 % (ref 43–77)
PLATELETS: 154 10*3/uL (ref 150–400)
RBC: 4.99 MIL/uL (ref 4.22–5.81)
RDW: 13.8 % (ref 11.5–15.5)
WBC: 5.4 10*3/uL (ref 4.0–10.5)

## 2014-02-23 LAB — CBC
HCT: 44.3 % (ref 39.0–52.0)
Hemoglobin: 14.8 g/dL (ref 13.0–17.0)
MCH: 27.8 pg (ref 26.0–34.0)
MCHC: 33.4 g/dL (ref 30.0–36.0)
MCV: 83.3 fL (ref 78.0–100.0)
Platelets: 156 10*3/uL (ref 150–400)
RBC: 5.32 MIL/uL (ref 4.22–5.81)
RDW: 13.6 % (ref 11.5–15.5)
WBC: 6.1 10*3/uL (ref 4.0–10.5)

## 2014-02-23 LAB — PREPARE RBC (CROSSMATCH)

## 2014-02-23 LAB — ABO/RH: ABO/RH(D): O NEG

## 2014-02-23 MED ORDER — DIAZEPAM 5 MG PO TABS
5.0000 mg | ORAL_TABLET | Freq: Once | ORAL | Status: AC
  Administered 2014-02-24: 5 mg via ORAL
  Filled 2014-02-23: qty 1

## 2014-02-23 MED ORDER — DOPAMINE-DEXTROSE 3.2-5 MG/ML-% IV SOLN
0.0000 ug/kg/min | INTRAVENOUS | Status: AC
  Administered 2014-02-24: 3 ug/kg/min via INTRAVENOUS
  Filled 2014-02-23: qty 250

## 2014-02-23 MED ORDER — EPINEPHRINE HCL 1 MG/ML IJ SOLN
0.0000 ug/min | INTRAVENOUS | Status: DC
  Filled 2014-02-23: qty 4

## 2014-02-23 MED ORDER — VANCOMYCIN HCL 10 G IV SOLR
1500.0000 mg | INTRAVENOUS | Status: AC
  Administered 2014-02-24: 1500 mg via INTRAVENOUS
  Filled 2014-02-23: qty 1500

## 2014-02-23 MED ORDER — CHLORHEXIDINE GLUCONATE 4 % EX LIQD
60.0000 mL | Freq: Once | CUTANEOUS | Status: AC
  Administered 2014-02-24: 4 via TOPICAL
  Filled 2014-02-23: qty 60

## 2014-02-23 MED ORDER — POTASSIUM CHLORIDE 2 MEQ/ML IV SOLN
80.0000 meq | INTRAVENOUS | Status: DC
  Filled 2014-02-23: qty 40

## 2014-02-23 MED ORDER — PHENYLEPHRINE HCL 10 MG/ML IJ SOLN
30.0000 ug/min | INTRAVENOUS | Status: AC
  Administered 2014-02-24: 10 ug/min via INTRAVENOUS
  Filled 2014-02-23: qty 2

## 2014-02-23 MED ORDER — DEXMEDETOMIDINE HCL IN NACL 400 MCG/100ML IV SOLN
0.1000 ug/kg/h | INTRAVENOUS | Status: AC
  Administered 2014-02-24: .3 ug/kg/h via INTRAVENOUS
  Administered 2014-02-24: 15:00:00 via INTRAVENOUS
  Filled 2014-02-23: qty 100

## 2014-02-23 MED ORDER — CHLORHEXIDINE GLUCONATE 4 % EX LIQD
60.0000 mL | Freq: Once | CUTANEOUS | Status: AC
  Administered 2014-02-23: 4 via TOPICAL
  Filled 2014-02-23: qty 60

## 2014-02-23 MED ORDER — SODIUM CHLORIDE 0.9 % IV SOLN
INTRAVENOUS | Status: DC
  Filled 2014-02-23: qty 30

## 2014-02-23 MED ORDER — CEFUROXIME SODIUM 750 MG IJ SOLR
750.0000 mg | INTRAMUSCULAR | Status: DC
  Filled 2014-02-23: qty 750

## 2014-02-23 MED ORDER — TEMAZEPAM 15 MG PO CAPS: 15.0000 mg | ORAL_CAPSULE | Freq: Once | ORAL | Status: AC | PRN

## 2014-02-23 MED ORDER — DEXTROSE 5 % IV SOLN
1.5000 g | INTRAVENOUS | Status: AC
  Administered 2014-02-24: 1.5 g via INTRAVENOUS
  Administered 2014-02-24: .75 g via INTRAVENOUS
  Filled 2014-02-23: qty 1.5

## 2014-02-23 MED ORDER — MAGNESIUM SULFATE 50 % IJ SOLN
40.0000 meq | INTRAMUSCULAR | Status: DC
  Filled 2014-02-23: qty 10

## 2014-02-23 MED ORDER — NITROGLYCERIN IN D5W 200-5 MCG/ML-% IV SOLN
2.0000 ug/min | INTRAVENOUS | Status: DC
  Filled 2014-02-23: qty 250

## 2014-02-23 MED ORDER — AMINOCAPROIC ACID 250 MG/ML IV SOLN
INTRAVENOUS | Status: AC
  Administered 2014-02-24: 69.8 mL/h via INTRAVENOUS
  Filled 2014-02-23: qty 40

## 2014-02-23 MED ORDER — PLASMA-LYTE 148 IV SOLN
INTRAVENOUS | Status: AC
  Administered 2014-02-24: 500 mL
  Filled 2014-02-23: qty 2.5

## 2014-02-23 MED ORDER — SODIUM CHLORIDE 0.9 % IV SOLN
INTRAVENOUS | Status: AC
  Administered 2014-02-24: 1 [IU]/h via INTRAVENOUS
  Filled 2014-02-23: qty 2.5

## 2014-02-23 MED ORDER — ALPRAZOLAM 0.25 MG PO TABS: 0.2500 mg | ORAL_TABLET | ORAL | Status: DC | PRN

## 2014-02-23 MED ORDER — BISACODYL 5 MG PO TBEC
5.0000 mg | DELAYED_RELEASE_TABLET | Freq: Once | ORAL | Status: AC
  Administered 2014-02-23: 5 mg via ORAL
  Filled 2014-02-23: qty 1

## 2014-02-23 NOTE — Progress Notes (Signed)
Patient Name: Jeffrey [name] Date of Encounter: 02/23/2014     Active Problems:   Dyspnea   Aortic aneurysm   Aortic regurgitation    SUBJECTIVE In good spirits. No complaints, just awaiting surgery.   CURRENT MEDS . bisacodyl  5 mg Oral Once  . chlorhexidine  60 mL Topical Once   And  . [START ON 02/24/2014] chlorhexidine  60 mL Topical Once  . [START ON 02/24/2014] diazepam  5 mg Oral Once  . lisinopril  5 mg Oral BID  . pantoprazole  40 mg Oral Daily  . sodium chloride  3 mL Intravenous Q12H  . sodium chloride  3 mL Intravenous Q12H  . sodium chloride  3 mL Intravenous Q12H    OBJECTIVE  Filed Vitals:   02/22/14 1042 02/22/14 1522 02/22/14 2018 02/23/14 0527  BP: 120/64 143/73 136/63 118/61  Pulse: 84 88 83 74  Temp:  97.8 F (36.6 C) 97.5 F (36.4 C) 97.8 F (36.6 C)  TempSrc:  Oral Oral Oral  Resp:   18 18  Height:      Weight:    210 lb 4.8 oz (95.391 kg)  SpO2: 98% 99% 98% 98%    Intake/Output Summary (Last 24 hours) at 02/23/14 1037 Last data filed at 02/23/14 0300  Gross per 24 hour  Intake      9 ml  Output   3175 ml  Net  -3166 ml   Filed Weights   02/21/14 0538 02/22/14 0434 02/23/14 0527  Weight: 214 lb 11.2 oz (97.387 kg) 211 lb (95.709 kg) 210 lb 4.8 oz (95.391 kg)    PHYSICAL EXAM  General: Pleasant, NAD. Neuro: Alert and oriented X 3. Moves all extremities spontaneously. Psych: Normal affect. HEENT:  Normal  Neck: Supple without bruits or JVD. Lungs:  Resp regular and unlabored, CTA. Heart: HRRR S1 S2 Pulses are 2+ & equal.2/6 SM at RUSB to LLSB Abdomen: Soft, non-tender, non-distended, BS + x 4.  Extremities: No clubbing, cyanosis or edema. DP/PT/Radials 2+ and equal bilaterally.  Accessory Clinical Findings  CBC  Recent Labs  02/22/14 0414 02/23/14 0337 02/23/14 0820  WBC 5.8 5.4 6.1  NEUTROABS 3.7 3.2  --   HGB 13.4 13.9 14.8  HCT 40.0 41.8 44.3  MCV 82.6 83.8 83.3  PLT 144* 154 156   Basic Metabolic  Panel  Recent Labs  41/58/30 0337 02/23/14 0820  NA 138 137  K 5.2 5.0  CL 104 103  CO2 23 23  GLUCOSE 99 118*  BUN 10 10  CREATININE 1.04 1.05  CALCIUM 9.1 9.4   Liver Function Tests  Recent Labs  02/22/14 0414 02/23/14 0337  AST 19 21  ALT 17 19  ALKPHOS 73 77  BILITOT 0.6 0.5  PROT 5.7* 6.1  ALBUMIN 3.2* 3.2*    TELE  Not on tele  Radiology/Studies  Dg Orthopantogram  02/20/2014   CLINICAL DATA:  Pre-op for Bentall procedure. Aortic insufficiency. Initial encounter.  EXAM: ORTHOPANTOGRAM/PANORAMIC  COMPARISON:  None.  FINDINGS: There are apparent incompletely erupted mandibular teeth bilaterally. No significant periodontal disease is identified. Midline artifact typical of the Panorex technique is noted.  IMPRESSION: No significant periodontal disease demonstrated.   Electronically Signed   By: Roxy Horseman M.D.   On: 02/20/2014 21:18   Dg Chest 2 View  02/18/2014   CLINICAL DATA:  Shortness of breath for 1 day, abdominal pain  EXAM: CHEST  2 VIEW  COMPARISON:  03/29/2013  FINDINGS: Enlargement of cardiac silhouette  with pulmonary vascular congestion.  Mediastinal contours normal.  Question minimal RIGHT perihilar edema with note of a few Kerley B-lines at the lung bases, likely representing minimal pulmonary edema.  No segmental consolidation, pleural effusion or pneumothorax.  Bones unremarkable.  IMPRESSION: Enlargement of cardiac silhouette with pulmonary vascular congestion and suspect minimal pulmonary edema.   Electronically Signed   By: Ulyses SouthwardMark  Boles M.D.   On: 02/18/2014 18:27   Ct Angio Chest Aortic Dissect W &/or W/o  02/19/2014   CLINICAL DATA:  Cough and shortness of breath, history of aortic aneurysm  EXAM: CT ANGIOGRAPHY CHEST WITH CONTRAST  TECHNIQUE: Multidetector CT imaging of the chest was performed using the standard protocol during bolus administration of intravenous contrast. Multiplanar CT image reconstructions and MIPs were obtained to evaluate  the vascular anatomy.  CONTRAST:  100mL OMNIPAQUE IOHEXOL 350 MG/ML SOLN  COMPARISON:  Plain film from earlier in the same day.  FINDINGS: The lungs are well aerated but demonstrate mild emphysematous changes bilaterally. Somewhat linear appearing nodular density is min  Noted in the right upper lobe measuring 10 mm in greatest dimension. It has a sub solid component. This is best seen on the coronal imaging image 66 of series 8. No other parenchymal abnormalities are noted. Small right-sided pleural effusion is seen.  The hilar and mediastinal structures show aneurysmal dilatation of the ascending aorta. The aneurysm arises at the level aortic root. Although not gated for cardiac motion measurement at the sino-tubular junction is approximately 5.4 x 5.5 cm in greatest transverse and AP dimensions. It measures approximately 6.8 x 7.1 cm in greatest AP and transverse dimensions in the ascending aorta at the level of the main pulmonary artery. It does not appear to significantly involves the arch and the descending aorta tapers in a normal fashion. No dissection is identified the left vertebral artery arises directly from the aortic arch. The origins of the brachiocephalic vessels are within normal limits. No significant coronary calcifications are noted. No right heart strain is seen. Although not timed for pulmonary artery evaluation no definitive pulmonary embolism is seen  The visualized upper abdomen is within normal limits. Some variant anatomy is noted with the left gastric artery arising directly from the aorta. The osseous structures show no acute abnormality.  Review of the MIP images confirms the above findings.  IMPRESSION: No evidence of pulmonary emboli. A small right pleural effusion is noted.  Ascending aortic aneurysm as described above.  Nodular changes within the right upper lobe. If the patient is at high risk for bronchogenic carcinoma, follow-up chest CT at 3-76months is recommended. If the  patient is at low risk for bronchogenic carcinoma, follow-up chest CT at 6-12 months is recommended. This recommendation follows the consensus statement: Guidelines for Management of Small Pulmonary Nodules Detected on CT Scans: A Statement from the Fleischner Society as published in Radiology 2005; 237:395-400.  Variant anatomy of the left vertebral artery and left gastric artery.   Electronically Signed   By: Alcide CleverMark  Lukens M.D.   On: 02/19/2014 17:04    ASSESSMENT AND PLAN  Aortic Aneurysm: Over 6 cm for root replacement Monday with CVTS   AR: Severe by TEE with markedly elevated LEDP 34at cath which is ain motivation to do surgery early and not delay. Continue ACE.   GERD: Continue pantoprazole Normal Coronary Arteries by cath  For aortic root replacement and AVR Monday with  All preop studies being done   Signed, Janetta HoraHOMPSON, Rynn Markiewicz R PA-C  Pager [phone]

## 2014-02-23 NOTE — Progress Notes (Signed)
Jeffrey [name] WJX:[id number] DOB: [birth year] DOA: 02/18/2014 PCP: Beverley FiedlerANKINS,VICTORIA, MD  Brief narrative3: 48 y/o ? Gout, recent h/p brocnhtiis [facility] with bloating and abd discomfort and persistent cough. He was found to have severe Aortic dilatation, and AoV insuff and recommended to have surgery for this  Past medical history-As per Problem list Chart reviewed as below- reviewed  Consultants:   Cardiology  CT surgery  Procedures:  Ct angio  Antibiotics:  none   Subjective   Ambulatory No issues overnight Ready for am surgery   Objective    Interim History:   Telemetry:    Objective: Filed Vitals:   02/22/14 2018 02/23/14 0527 02/23/14 1039 02/23/14 1040  BP: 136/63 118/61  133/73  Pulse: 83 74 85 85  Temp: 97.5 F (36.4 C) 97.8 F (36.6 C)    TempSrc: Oral Oral    Resp: 18 18    Height:      Weight:  95.391 kg (210 lb 4.8 oz)    SpO2: 98% 98%  100%    Intake/Output Summary (Last 24 hours) at 02/23/14 1305 Last data filed at 02/23/14 1042  Gross per 24 hour  Intake    369 ml  Output   3175 ml  Net  -2806 ml    Exam:  General: eomi ncat Cardiovascular: s1 s2 no m/r/g Respiratory: clear Abd: soft, Nt/ND   Data Reviewed: Basic Metabolic Panel:  Recent Labs Lab 02/20/14 0325 02/21/14 0528 02/22/14 0414 02/23/14 0337 02/23/14 0820  NA 141 139 137 138 137  K 4.1 4.5 4.5 5.2 5.0  CL 104 104 103 104 103  CO2 21 22 21 23 23   GLUCOSE 106* 104* 96 99 118*  BUN 14 12 11 10 10   CREATININE 1.14 1.04 0.98 1.04 1.05  CALCIUM 8.8 8.9 8.7 9.1 9.4   Liver Function Tests:  Recent Labs Lab 02/19/14 0415 02/20/14 0325 02/21/14 0528 02/22/14 0414 02/23/14 0337  AST 19 18 19 19 21   ALT 22 21 19 17 19   ALKPHOS 71 77 78 73 77  BILITOT 0.4 0.6 0.7 0.6 0.5  PROT 5.6* 6.0 6.0 5.7* 6.1  ALBUMIN 3.1* 3.3* 3.3* 3.2* 3.2*    Recent Labs Lab 02/18/14 1814  LIPASE 31   No results for input(s): AMMONIA in the last  168 hours. CBC:  Recent Labs Lab 02/19/14 0415 02/20/14 0325 02/21/14 0528 02/22/14 0414 02/23/14 0337 02/23/14 0820  WBC 6.0 6.6 6.6 5.8 5.4 6.1  NEUTROABS 3.7 3.9 4.7 3.7 3.2  --   HGB 13.4 14.2 14.1 13.4 13.9 14.8  HCT 39.3 40.5 41.6 40.0 41.8 44.3  MCV 81.4 80.7 80.9 82.6 83.8 83.3  PLT 174 175 157 144* 154 156   Cardiac Enzymes:  Recent Labs Lab 02/18/14 1814 02/18/14 1818 02/18/14 2208 02/19/14 0415 02/19/14 0851  TROPONINI <0.30 <0.30 <0.30 <0.30 <0.30   BNP: Invalid input(s): POCBNP CBG: No results for input(s): GLUCAP in the last 168 hours.  No results found for this or any previous visit (from the past 240 hour(s)).   Studies:              All Imaging reviewed and is as per above notation   Scheduled Meds: . chlorhexidine  60 mL Topical Once   And  . [START ON 02/24/2014] chlorhexidine  60 mL Topical Once  . [START ON 02/24/2014] diazepam  5 mg Oral Once  . lisinopril  5 mg Oral BID  . pantoprazole  40 mg Oral Daily  .  sodium chloride  3 mL Intravenous Q12H  . sodium chloride  3 mL Intravenous Q12H  . sodium chloride  3 mL Intravenous Q12H   Continuous Infusions:    Assessment/Plan:  1. Acute decompensated diastolic chf per Echo 02/19/14 2/2 to severe AoV regurgitation-Appreciate Cardiology and CT surgery input.  Lasix on hold  Cont Lisinopril 5 mg bid.  Hold B blocker for now.  2. Cough-resolved.  No further work-up.  afebrile 3. Ascending AoV aneurysm dilatation-CT chest confirms dilatation.  TEE AoV 6.9 cm enlargement.  Cath confirms dilation.  Orthopantogram neg, PFT's done 11/13--scheduled for Bentallprocedure 11.16.15 and then CVTS service to take over management-much appreciate input 4. Gerd-continue Pantoprazole 5. Possible pre-DM-Out pt reassesment  DVT-SCd [ambulatory and doesn't like heparin] Tele Dc  Code Status: Full Family Communication:  D/w patient Disposition Plan: inpatient   Pleas Koch, MD  [facility] Pager  [phone] 02/23/2014, 1:05 PM    LOS: 5 days

## 2014-02-23 NOTE — Plan of Care (Signed)
Problem: Discharge Progression Outcomes Goal: Hemodynamically stable Outcome: Progressing VSS. Surgery scheduled for tomorrow morning. Goal: Tolerating diet Outcome: Progressing

## 2014-02-23 NOTE — Plan of Care (Signed)
Problem: Consults Goal: Cardiac Surgery Patient Education ( See Patient Education module for education specifics.) Outcome: Completed/Met Date Met:  02/23/14 Goal: Skin Care Protocol Initiated - if Braden Score 18 or less If consults are not indicated, leave blank or document N/A Outcome: Not Applicable Date Met:  83/29/19 Goal: Tobacco Cessation referral if indicated Outcome: Not Applicable Date Met:  16/60/60 Goal: Nutrition Consult-if indicated Outcome: Not Applicable Date Met:  04/59/97 Goal: Diabetes Guidelines if Diabetic/Glucose > 140 If diabetic or lab glucose is > 140 mg/dl - Initiate Diabetes/Hyperglycemia Guidelines & Document Interventions  Outcome: Not Applicable Date Met:  74/14/23  Problem: Phase I - Pre-Op Goal: Point person for discharge identified Outcome: Completed/Met Date Met:  02/23/14 Goal: Pain controlled with appropriate interventions Outcome: Completed/Met Date Met:  02/23/14 Goal: Pre-Op Consults completed as appropriate Outcome: Completed/Met Date Met:  02/23/14 Goal: Pre-Op Education completed Outcome: Completed/Met Date Met:  02/23/14 Goal: Treatment completed per MD order Outcome: Completed/Met Date Met:  02/23/14 Goal: Patient progressed to Phase II; barriers addressed Outcome: Completed/Met Date Met:  02/23/14

## 2014-02-24 ENCOUNTER — Inpatient Hospital Stay (HOSPITAL_COMMUNITY): Payer: BC Managed Care – PPO

## 2014-02-24 ENCOUNTER — Encounter (HOSPITAL_COMMUNITY)
Admission: EM | Disposition: A | Discharge status: Home / Self Care | Payer: BC Managed Care – PPO | Source: Home / Self Care | Attending: Cardiothoracic Surgery

## 2014-02-24 ENCOUNTER — Inpatient Hospital Stay (HOSPITAL_COMMUNITY): Payer: BC Managed Care – PPO | Admitting: Anesthesiology

## 2014-02-24 DIAGNOSIS — I351 Nonrheumatic aortic (valve) insufficiency: Secondary | ICD-10-CM

## 2014-02-24 HISTORY — PX: BENTALL PROCEDURE: SHX5058

## 2014-02-24 HISTORY — DX: Nonrheumatic aortic (valve) insufficiency: I35.1

## 2014-02-24 HISTORY — PX: INTRAOPERATIVE TRANSESOPHAGEAL ECHOCARDIOGRAM: SHX5062

## 2014-02-24 LAB — BASIC METABOLIC PANEL
Anion gap: 10 (ref 5–15)
BUN: 10 mg/dL (ref 6–23)
CO2: 22 mEq/L (ref 19–32)
Calcium: 7.7 mg/dL — ABNORMAL LOW (ref 8.4–10.5)
Chloride: 107 mEq/L (ref 96–112)
Creatinine, Ser: 0.79 mg/dL (ref 0.50–1.35)
GFR calc Af Amer: >90 mL/min (ref >90)
GFR calc non Af Amer: >90 mL/min (ref >90)
Glucose, Bld: 114 mg/dL — ABNORMAL HIGH (ref 70–99)
Potassium: 3.9 mEq/L (ref 3.7–5.3)
Sodium: 139 mEq/L (ref 137–147)

## 2014-02-24 LAB — CBC
HCT: 35.8 % — ABNORMAL LOW (ref 39.0–52.0)
HCT: 35.4 % — ABNORMAL LOW (ref 39.0–52.0)
HEMOGLOBIN: 12.4 g/dL — AB (ref 13.0–17.0)
Hemoglobin: 12.2 g/dL — ABNORMAL LOW (ref 13.0–17.0)
MCH: 27.3 pg (ref 26.0–34.0)
MCH: 27.9 pg (ref 26.0–34.0)
MCHC: 34.6 g/dL (ref 30.0–36.0)
MCHC: 34.5 g/dL (ref 30.0–36.0)
MCV: 78.7 fL (ref 78.0–100.0)
MCV: 80.8 fL (ref 78.0–100.0)
Platelets: 112 10*3/uL — ABNORMAL LOW (ref 150–400)
Platelets: 141 10*3/uL — ABNORMAL LOW (ref 150–400)
RBC: 4.55 MIL/uL (ref 4.22–5.81)
RBC: 4.38 MIL/uL (ref 4.22–5.81)
RDW: 13.3 % (ref 11.5–15.5)
RDW: 13.4 % (ref 11.5–15.5)
WBC: 12.6 10*3/uL — ABNORMAL HIGH (ref 4.0–10.5)
WBC: 14.7 10*3/uL — ABNORMAL HIGH (ref 4.0–10.5)

## 2014-02-24 LAB — POCT I-STAT, CHEM 8
BUN: 8 mg/dL (ref 6–23)
BUN: 8 mg/dL (ref 6–23)
BUN: 7 mg/dL (ref 6–23)
BUN: 8 mg/dL (ref 6–23)
BUN: 8 mg/dL (ref 6–23)
BUN: 8 mg/dL (ref 6–23)
CALCIUM ION: 1.24 mmol/L — AB (ref 1.12–1.23)
CALCIUM ION: 1 mmol/L — AB (ref 1.12–1.23)
CALCIUM ION: 0.87 mmol/L — AB (ref 1.12–1.23)
CHLORIDE: 102 meq/L (ref 96–112)
CHLORIDE: 100 meq/L (ref 96–112)
CREATININE: 0.7 mg/dL (ref 0.50–1.35)
CREATININE: 0.8 mg/dL (ref 0.50–1.35)
CREATININE: 0.7 mg/dL (ref 0.50–1.35)
CREATININE: 0.9 mg/dL (ref 0.50–1.35)
Calcium, Ion: 1.27 mmol/L — ABNORMAL HIGH (ref 1.12–1.23)
Calcium, Ion: 1.03 mmol/L — ABNORMAL LOW (ref 1.12–1.23)
Calcium, Ion: 1.16 mmol/L (ref 1.12–1.23)
Chloride: 107 mEq/L (ref 96–112)
Chloride: 106 mEq/L (ref 96–112)
Chloride: 99 mEq/L (ref 96–112)
Chloride: 107 mEq/L (ref 96–112)
Creatinine, Ser: 0.9 mg/dL (ref 0.50–1.35)
Creatinine, Ser: 0.8 mg/dL (ref 0.50–1.35)
GLUCOSE: 117 mg/dL — AB (ref 70–99)
GLUCOSE: 181 mg/dL — AB (ref 70–99)
Glucose, Bld: 121 mg/dL — ABNORMAL HIGH (ref 70–99)
Glucose, Bld: 114 mg/dL — ABNORMAL HIGH (ref 70–99)
Glucose, Bld: 181 mg/dL — ABNORMAL HIGH (ref 70–99)
Glucose, Bld: 120 mg/dL — ABNORMAL HIGH (ref 70–99)
HCT: 36 % — ABNORMAL LOW (ref 39.0–52.0)
HCT: 27 % — ABNORMAL LOW (ref 39.0–52.0)
HCT: 26 % — ABNORMAL LOW (ref 39.0–52.0)
HCT: 25 % — ABNORMAL LOW (ref 39.0–52.0)
HEMATOCRIT: 36 % — AB (ref 39.0–52.0)
HEMATOCRIT: 36 % — AB (ref 39.0–52.0)
HEMOGLOBIN: 8.5 g/dL — AB (ref 13.0–17.0)
HEMOGLOBIN: 12.2 g/dL — AB (ref 13.0–17.0)
Hemoglobin: 12.2 g/dL — ABNORMAL LOW (ref 13.0–17.0)
Hemoglobin: 12.2 g/dL — ABNORMAL LOW (ref 13.0–17.0)
Hemoglobin: 9.2 g/dL — ABNORMAL LOW (ref 13.0–17.0)
Hemoglobin: 8.8 g/dL — ABNORMAL LOW (ref 13.0–17.0)
POTASSIUM: 4.1 meq/L (ref 3.7–5.3)
POTASSIUM: 4.1 meq/L (ref 3.7–5.3)
POTASSIUM: 3.9 meq/L (ref 3.7–5.3)
Potassium: 3.1 mEq/L — ABNORMAL LOW (ref 3.7–5.3)
Potassium: 4 mEq/L (ref 3.7–5.3)
Potassium: 3.8 mEq/L (ref 3.7–5.3)
SODIUM: 133 meq/L — AB (ref 137–147)
Sodium: 137 mEq/L (ref 137–147)
Sodium: 138 mEq/L (ref 137–147)
Sodium: 135 mEq/L — ABNORMAL LOW (ref 137–147)
Sodium: 136 mEq/L — ABNORMAL LOW (ref 137–147)
Sodium: 141 mEq/L (ref 137–147)
TCO2: 22 mmol/L (ref 0–100)
TCO2: 23 mmol/L (ref 0–100)
TCO2: 22 mmol/L (ref 0–100)
TCO2: 22 mmol/L (ref 0–100)
TCO2: 23 mmol/L (ref 0–100)
TCO2: 19 mmol/L (ref 0–100)

## 2014-02-24 LAB — POCT I-STAT 3, ART BLOOD GAS (G3+)
ACID-BASE DEFICIT: 2 mmol/L (ref 0.0–2.0)
Acid-base deficit: 2 mmol/L (ref 0.0–2.0)
Acid-base deficit: 3 mmol/L — ABNORMAL HIGH (ref 0.0–2.0)
BICARBONATE: 22.7 meq/L (ref 20.0–24.0)
BICARBONATE: 23.2 meq/L (ref 20.0–24.0)
Bicarbonate: 25.3 mEq/L — ABNORMAL HIGH (ref 20.0–24.0)
Bicarbonate: 24.9 mEq/L — ABNORMAL HIGH (ref 20.0–24.0)
Bicarbonate: 21.5 mEq/L (ref 20.0–24.0)
O2 SAT: 100 %
O2 Saturation: 100 %
O2 Saturation: 100 %
O2 Saturation: 100 %
O2 Saturation: 92 %
PCO2 ART: 44.5 mmHg (ref 35.0–45.0)
PO2 ART: 328 mmHg — AB (ref 80.0–100.0)
PO2 ART: 181 mmHg — AB (ref 80.0–100.0)
Patient temperature: 36.9
TCO2: 27 mmol/L (ref 0–100)
TCO2: 24 mmol/L (ref 0–100)
TCO2: 24 mmol/L (ref 0–100)
TCO2: 26 mmol/L (ref 0–100)
TCO2: 22 mmol/L (ref 0–100)
pCO2 arterial: 36.2 mmHg (ref 35.0–45.0)
pCO2 arterial: 41.1 mmHg (ref 35.0–45.0)
pCO2 arterial: 40.3 mmHg (ref 35.0–45.0)
pCO2 arterial: 33.8 mmHg — ABNORMAL LOW (ref 35.0–45.0)
pH, Arterial: 7.364 (ref 7.350–7.450)
pH, Arterial: 7.405 (ref 7.350–7.450)
pH, Arterial: 7.36 (ref 7.350–7.450)
pH, Arterial: 7.398 (ref 7.350–7.450)
pH, Arterial: 7.41 (ref 7.350–7.450)
pO2, Arterial: 332 mmHg — ABNORMAL HIGH (ref 80.0–100.0)
pO2, Arterial: 406 mmHg — ABNORMAL HIGH (ref 80.0–100.0)
pO2, Arterial: 63 mmHg — ABNORMAL LOW (ref 80.0–100.0)

## 2014-02-24 LAB — POCT I-STAT 4, (NA,K, GLUC, HGB,HCT)
GLUCOSE: 134 mg/dL — AB (ref 70–99)
HEMATOCRIT: 36 % — AB (ref 39.0–52.0)
Hemoglobin: 12.2 g/dL — ABNORMAL LOW (ref 13.0–17.0)
Potassium: 3.7 mEq/L (ref 3.7–5.3)
SODIUM: 140 meq/L (ref 137–147)

## 2014-02-24 LAB — CBC WITH DIFFERENTIAL/PLATELET
Basophils Absolute: 0 10*3/uL (ref 0.0–0.1)
Basophils Relative: 0 % (ref 0–1)
Eosinophils Absolute: 0.3 10*3/uL (ref 0.0–0.7)
Eosinophils Relative: 5 % (ref 0–5)
HCT: 39.9 % (ref 39.0–52.0)
Hemoglobin: 13.5 g/dL (ref 13.0–17.0)
Lymphocytes Relative: 31 % (ref 12–46)
Lymphs Abs: 1.6 10*3/uL (ref 0.7–4.0)
MCH: 27.8 pg (ref 26.0–34.0)
MCHC: 33.8 g/dL (ref 30.0–36.0)
MCV: 82.3 fL (ref 78.0–100.0)
Monocytes Absolute: 0.5 10*3/uL (ref 0.1–1.0)
Monocytes Relative: 9 % (ref 3–12)
Neutro Abs: 2.8 10*3/uL (ref 1.7–7.7)
Neutrophils Relative %: 55 % (ref 43–77)
Platelets: 147 10*3/uL — ABNORMAL LOW (ref 150–400)
RBC: 4.85 MIL/uL (ref 4.22–5.81)
RDW: 13.4 % (ref 11.5–15.5)
WBC: 5.2 10*3/uL (ref 4.0–10.5)

## 2014-02-24 LAB — HEMOGLOBIN AND HEMATOCRIT, BLOOD
HCT: 26.7 % — ABNORMAL LOW (ref 39.0–52.0)
Hemoglobin: 9.1 g/dL — ABNORMAL LOW (ref 13.0–17.0)

## 2014-02-24 LAB — POCT I-STAT GLUCOSE
Glucose, Bld: 125 mg/dL — ABNORMAL HIGH (ref 70–99)
Glucose, Bld: 177 mg/dL — ABNORMAL HIGH (ref 70–99)
Operator id: 3406
Operator id: 3406

## 2014-02-24 LAB — PROTIME-INR
INR: 1.15 (ref 0.00–1.49)
INR: 1.36 (ref 0.00–1.49)
INR: 1.47 (ref 0.00–1.49)
Prothrombin Time: 14.9 seconds (ref 11.6–15.2)
Prothrombin Time: 16.9 seconds — ABNORMAL HIGH (ref 11.6–15.2)
Prothrombin Time: 18 seconds — ABNORMAL HIGH (ref 11.6–15.2)

## 2014-02-24 LAB — FIBRINOGEN
Fibrinogen: 163 mg/dL — ABNORMAL LOW (ref 204–475)
Fibrinogen: 278 mg/dL (ref 204–475)

## 2014-02-24 LAB — CARBOXYHEMOGLOBIN
Carboxyhemoglobin: 1.1 % (ref 0.5–1.5)
Methemoglobin: 1 % (ref 0.0–1.5)
O2 Saturation: 60.5 %
Total hemoglobin: 11.1 g/dL — ABNORMAL LOW (ref 13.5–18.0)

## 2014-02-24 LAB — APTT
aPTT: 32 seconds (ref 24–37)
aPTT: 32 seconds (ref 24–37)
aPTT: 39 seconds — ABNORMAL HIGH (ref 24–37)

## 2014-02-24 LAB — SURGICAL PCR SCREEN
MRSA, PCR: NEGATIVE
STAPHYLOCOCCUS AUREUS: POSITIVE — AB

## 2014-02-24 LAB — PLATELET COUNT: Platelets: 41 10*3/uL — ABNORMAL LOW (ref 150–400)

## 2014-02-24 SURGERY — BENTALL PROCEDURE
Anesthesia: General | Site: Chest

## 2014-02-24 MED ORDER — PROPOFOL 10 MG/ML IV BOLUS
INTRAVENOUS | Status: AC
  Filled 2014-02-24: qty 20

## 2014-02-24 MED ORDER — FENTANYL CITRATE 0.05 MG/ML IJ SOLN
INTRAMUSCULAR | Status: DC | PRN
  Administered 2014-02-24 (×2): 250 ug via INTRAVENOUS
  Administered 2014-02-24: 600 ug via INTRAVENOUS
  Administered 2014-02-24: 50 ug via INTRAVENOUS
  Administered 2014-02-24: 100 ug via INTRAVENOUS
  Administered 2014-02-24 (×3): 50 ug via INTRAVENOUS
  Administered 2014-02-24: 100 ug via INTRAVENOUS

## 2014-02-24 MED ORDER — CHLORHEXIDINE GLUCONATE CLOTH 2 % EX PADS
6.0000 | MEDICATED_PAD | Freq: Every day | CUTANEOUS | Status: DC
  Administered 2014-02-24: 6 via TOPICAL

## 2014-02-24 MED ORDER — ACETAMINOPHEN 160 MG/5ML PO SOLN
1000.0000 mg | Freq: Four times a day (QID) | ORAL | Status: AC
  Administered 2014-02-25 (×2): 1000 mg
  Filled 2014-02-24: qty 40.6

## 2014-02-24 MED ORDER — INSULIN REGULAR BOLUS VIA INFUSION
0.0000 [IU] | Freq: Three times a day (TID) | INTRAVENOUS | Status: DC
  Filled 2014-02-24: qty 10

## 2014-02-24 MED ORDER — ARTIFICIAL TEARS OP OINT
TOPICAL_OINTMENT | OPHTHALMIC | Status: AC
  Filled 2014-02-24: qty 3.5

## 2014-02-24 MED ORDER — SODIUM CHLORIDE 0.9 % IR SOLN
Status: DC | PRN
  Administered 2014-02-24: 1

## 2014-02-24 MED ORDER — ACETAMINOPHEN 650 MG RE SUPP
650.0000 mg | Freq: Once | RECTAL | Status: AC
  Administered 2014-02-24: 650 mg via RECTAL

## 2014-02-24 MED ORDER — ALBUMIN HUMAN 5 % IV SOLN
12.5000 g | Freq: Once | INTRAVENOUS | Status: AC
  Administered 2014-02-24: 12.5 g via INTRAVENOUS

## 2014-02-24 MED ORDER — ACETAMINOPHEN 500 MG PO TABS
1000.0000 mg | ORAL_TABLET | Freq: Four times a day (QID) | ORAL | Status: AC
  Administered 2014-02-25 – 2014-03-01 (×15): 1000 mg via ORAL
  Filled 2014-02-24 (×25): qty 2

## 2014-02-24 MED ORDER — POTASSIUM CHLORIDE 10 MEQ/50ML IV SOLN
10.0000 meq | INTRAVENOUS | Status: AC
  Administered 2014-02-24 (×3): 10 meq via INTRAVENOUS

## 2014-02-24 MED ORDER — LACTATED RINGERS IV SOLN
INTRAVENOUS | Status: DC | PRN
  Administered 2014-02-24 (×2): via INTRAVENOUS

## 2014-02-24 MED ORDER — ROCURONIUM BROMIDE 50 MG/5ML IV SOLN
INTRAVENOUS | Status: AC
  Filled 2014-02-24: qty 2

## 2014-02-24 MED ORDER — MUPIROCIN 2 % EX OINT
1.0000 "application " | TOPICAL_OINTMENT | Freq: Two times a day (BID) | CUTANEOUS | Status: AC
  Administered 2014-02-24 – 2014-02-28 (×8): 1 via NASAL
  Filled 2014-02-24 (×2): qty 22

## 2014-02-24 MED ORDER — ASPIRIN 81 MG PO CHEW: 324.0000 mg | CHEWABLE_TABLET | Freq: Every day | ORAL | Status: DC

## 2014-02-24 MED ORDER — PROPOFOL 10 MG/ML IV BOLUS
INTRAVENOUS | Status: DC | PRN
  Administered 2014-02-24 (×3): 50 mg via INTRAVENOUS

## 2014-02-24 MED ORDER — NITROGLYCERIN IN D5W 200-5 MCG/ML-% IV SOLN: 0.0000 ug/min | INTRAVENOUS | Status: DC

## 2014-02-24 MED ORDER — LACTATED RINGERS IV SOLN
INTRAVENOUS | Status: DC | PRN
  Administered 2014-02-24 (×2): via INTRAVENOUS

## 2014-02-24 MED ORDER — HEMOSTATIC AGENTS (NO CHARGE) OPTIME
TOPICAL | Status: DC | PRN
  Administered 2014-02-24 (×2): 1 via TOPICAL

## 2014-02-24 MED ORDER — BISACODYL 10 MG RE SUPP: 10.0000 mg | Freq: Every day | RECTAL | Status: DC

## 2014-02-24 MED ORDER — DEXMEDETOMIDINE HCL IN NACL 200 MCG/50ML IV SOLN
0.0000 ug/kg/h | INTRAVENOUS | Status: DC
  Administered 2014-02-24 – 2014-02-25 (×3): 0.7 ug/kg/h via INTRAVENOUS
  Filled 2014-02-24 (×3): qty 50

## 2014-02-24 MED ORDER — FENTANYL CITRATE 0.05 MG/ML IJ SOLN
INTRAMUSCULAR | Status: AC
  Filled 2014-02-24: qty 5

## 2014-02-24 MED ORDER — MILRINONE IN DEXTROSE 20 MG/100ML IV SOLN
INTRAVENOUS | Status: DC | PRN
  Administered 2014-02-24: .3 ug/kg/min via INTRAVENOUS

## 2014-02-24 MED ORDER — SODIUM CHLORIDE 0.45 % IV SOLN
INTRAVENOUS | Status: DC
  Administered 2014-02-24: 20 mL/h via INTRAVENOUS

## 2014-02-24 MED ORDER — BISACODYL 5 MG PO TBEC
10.0000 mg | DELAYED_RELEASE_TABLET | Freq: Every day | ORAL | Status: DC
  Administered 2014-02-25 – 2014-03-03 (×5): 10 mg via ORAL
  Filled 2014-02-24 (×5): qty 2

## 2014-02-24 MED ORDER — FAMOTIDINE IN NACL 20-0.9 MG/50ML-% IV SOLN
20.0000 mg | Freq: Two times a day (BID) | INTRAVENOUS | Status: DC
  Administered 2014-02-24: 20 mg via INTRAVENOUS
  Filled 2014-02-24: qty 50

## 2014-02-24 MED ORDER — MAGNESIUM SULFATE 4 GM/100ML IV SOLN
4.0000 g | Freq: Once | INTRAVENOUS | Status: AC
  Administered 2014-02-24: 4 g via INTRAVENOUS
  Filled 2014-02-24: qty 100

## 2014-02-24 MED ORDER — PANTOPRAZOLE SODIUM 40 MG PO TBEC
40.0000 mg | DELAYED_RELEASE_TABLET | Freq: Every day | ORAL | Status: DC
  Administered 2014-02-26 – 2014-03-03 (×4): 40 mg via ORAL
  Filled 2014-02-24 (×4): qty 1

## 2014-02-24 MED ORDER — SODIUM CHLORIDE 0.9 % IV SOLN: INTRAVENOUS | Status: DC | PRN

## 2014-02-24 MED ORDER — PHENYLEPHRINE HCL 10 MG/ML IJ SOLN
0.0000 ug/min | INTRAVENOUS | Status: DC
  Administered 2014-02-25: 30 ug/min via INTRAVENOUS
  Filled 2014-02-24 (×2): qty 2

## 2014-02-24 MED ORDER — SODIUM CHLORIDE 0.9 % IJ SOLN
INTRAMUSCULAR | Status: AC
  Filled 2014-02-24: qty 10

## 2014-02-24 MED ORDER — METOPROLOL TARTRATE 1 MG/ML IV SOLN
2.5000 mg | INTRAVENOUS | Status: DC | PRN
  Administered 2014-02-26: 5 mg via INTRAVENOUS

## 2014-02-24 MED ORDER — MIDAZOLAM HCL 2 MG/2ML IJ SOLN
2.0000 mg | INTRAMUSCULAR | Status: DC | PRN
  Administered 2014-02-24 – 2014-02-25 (×7): 2 mg via INTRAVENOUS
  Filled 2014-02-24 (×7): qty 2

## 2014-02-24 MED ORDER — NOREPINEPHRINE BITARTRATE 1 MG/ML IV SOLN
0.0000 ug/min | INTRAVENOUS | Status: DC
  Filled 2014-02-24: qty 4

## 2014-02-24 MED ORDER — LACTATED RINGERS IV SOLN
500.0000 mL | Freq: Once | INTRAVENOUS | Status: AC | PRN
Start: 2014-02-24 — End: 2014-02-24

## 2014-02-24 MED ORDER — DEXMEDETOMIDINE HCL IN NACL 400 MCG/100ML IV SOLN
0.1000 ug/kg/h | INTRAVENOUS | Status: DC
  Filled 2014-02-24: qty 100

## 2014-02-24 MED ORDER — ROCURONIUM BROMIDE 100 MG/10ML IV SOLN
INTRAVENOUS | Status: DC | PRN
  Administered 2014-02-24: 20 mg via INTRAVENOUS
  Administered 2014-02-24: 30 mg via INTRAVENOUS
  Administered 2014-02-24: 50 mg via INTRAVENOUS
  Administered 2014-02-24 (×2): 30 mg via INTRAVENOUS
  Administered 2014-02-24: 20 mg via INTRAVENOUS

## 2014-02-24 MED ORDER — ACETAMINOPHEN 160 MG/5ML PO SOLN: 650.0000 mg | Freq: Once | ORAL | Status: AC

## 2014-02-24 MED ORDER — SODIUM CHLORIDE 0.9 % IJ SOLN: 3.0000 mL | INTRAMUSCULAR | Status: DC | PRN

## 2014-02-24 MED ORDER — TRAMADOL HCL 50 MG PO TABS
50.0000 mg | ORAL_TABLET | ORAL | Status: DC | PRN
  Administered 2014-02-26 – 2014-02-27 (×3): 100 mg via ORAL
  Filled 2014-02-24 (×3): qty 2

## 2014-02-24 MED ORDER — MILRINONE IN DEXTROSE 20 MG/100ML IV SOLN
0.2000 ug/kg/min | INTRAVENOUS | Status: DC
  Administered 2014-02-24 – 2014-02-25 (×2): 0.3 ug/kg/min via INTRAVENOUS
  Administered 2014-02-26 (×2): 0.2 ug/kg/min via INTRAVENOUS
  Filled 2014-02-24 (×4): qty 100

## 2014-02-24 MED ORDER — ASPIRIN EC 325 MG PO TBEC
325.0000 mg | DELAYED_RELEASE_TABLET | Freq: Every day | ORAL | Status: DC
  Administered 2014-02-25 – 2014-03-03 (×7): 325 mg via ORAL
  Filled 2014-02-24 (×8): qty 1

## 2014-02-24 MED ORDER — EPHEDRINE SULFATE 50 MG/ML IJ SOLN
INTRAMUSCULAR | Status: AC
  Filled 2014-02-24: qty 1

## 2014-02-24 MED ORDER — MORPHINE SULFATE 2 MG/ML IJ SOLN: 2.0000 mg | INTRAMUSCULAR | Status: DC | PRN

## 2014-02-24 MED ORDER — LIDOCAINE HCL (CARDIAC) 20 MG/ML IV SOLN
INTRAVENOUS | Status: AC
  Filled 2014-02-24: qty 5

## 2014-02-24 MED ORDER — DEXTROSE 5 % IV SOLN
1.5000 g | Freq: Two times a day (BID) | INTRAVENOUS | Status: AC
  Administered 2014-02-24 – 2014-02-26 (×4): 1.5 g via INTRAVENOUS
  Filled 2014-02-24 (×4): qty 1.5

## 2014-02-24 MED ORDER — SUCCINYLCHOLINE CHLORIDE 20 MG/ML IJ SOLN
INTRAMUSCULAR | Status: AC
  Filled 2014-02-24: qty 1

## 2014-02-24 MED ORDER — MIDAZOLAM HCL 10 MG/2ML IJ SOLN
INTRAMUSCULAR | Status: AC
  Filled 2014-02-24: qty 2

## 2014-02-24 MED ORDER — EPHEDRINE SULFATE 50 MG/ML IJ SOLN
INTRAMUSCULAR | Status: DC | PRN
  Administered 2014-02-24: 5 mg via INTRAVENOUS

## 2014-02-24 MED ORDER — HEPARIN SODIUM (PORCINE) 1000 UNIT/ML IJ SOLN
INTRAMUSCULAR | Status: AC
  Filled 2014-02-24: qty 1

## 2014-02-24 MED ORDER — ALBUMIN HUMAN 5 % IV SOLN
250.0000 mL | INTRAVENOUS | Status: AC | PRN
  Administered 2014-02-24 – 2014-02-25 (×3): 250 mL via INTRAVENOUS
  Filled 2014-02-24 (×2): qty 250

## 2014-02-24 MED ORDER — LACTATED RINGERS IV SOLN
INTRAVENOUS | Status: DC
  Administered 2014-02-24: 16:00:00 via INTRAVENOUS

## 2014-02-24 MED ORDER — DOCUSATE SODIUM 100 MG PO CAPS
200.0000 mg | ORAL_CAPSULE | Freq: Every day | ORAL | Status: DC
  Administered 2014-02-25 – 2014-03-03 (×5): 200 mg via ORAL
  Filled 2014-02-24 (×8): qty 2

## 2014-02-24 MED ORDER — SODIUM CHLORIDE 0.9 % IV SOLN
INTRAVENOUS | Status: DC
  Administered 2014-02-24: 3 [IU]/h via INTRAVENOUS
  Filled 2014-02-24 (×2): qty 2.5

## 2014-02-24 MED ORDER — SODIUM CHLORIDE 0.9 % IV SOLN
INTRAVENOUS | Status: DC
  Administered 2014-02-24 – 2014-02-25 (×2): via INTRAVENOUS

## 2014-02-24 MED ORDER — MORPHINE SULFATE 2 MG/ML IJ SOLN
1.0000 mg | INTRAMUSCULAR | Status: AC | PRN
  Administered 2014-02-24 (×2): 2 mg via INTRAVENOUS
  Filled 2014-02-24: qty 2

## 2014-02-24 MED ORDER — PROTAMINE SULFATE 10 MG/ML IV SOLN
INTRAVENOUS | Status: DC | PRN
  Administered 2014-02-24: 100 mg via INTRAVENOUS
  Administered 2014-02-24 (×2): 50 mg via INTRAVENOUS
  Administered 2014-02-24: 10 mg via INTRAVENOUS
  Administered 2014-02-24: 50 mg via INTRAVENOUS
  Administered 2014-02-24: 40 mg via INTRAVENOUS

## 2014-02-24 MED ORDER — POTASSIUM CHLORIDE 10 MEQ/50ML IV SOLN
10.0000 meq | Freq: Once | INTRAVENOUS | Status: AC
  Administered 2014-02-24: 10 meq via INTRAVENOUS

## 2014-02-24 MED ORDER — SODIUM CHLORIDE 0.9 % IJ SOLN
3.0000 mL | Freq: Two times a day (BID) | INTRAMUSCULAR | Status: DC
  Administered 2014-02-25 – 2014-03-03 (×8): 3 mL via INTRAVENOUS

## 2014-02-24 MED ORDER — SODIUM CHLORIDE 0.9 % IV SOLN
0.5000 g/h | Freq: Once | INTRAVENOUS | Status: DC
  Filled 2014-02-24: qty 20

## 2014-02-24 MED ORDER — SODIUM CHLORIDE 0.9 % IV SOLN
40.0000 ug/h | INTRAVENOUS | Status: DC
  Administered 2014-02-24: 40 ug/h via INTRAVENOUS
  Filled 2014-02-24: qty 50

## 2014-02-24 MED ORDER — METOPROLOL TARTRATE 12.5 MG HALF TABLET
12.5000 mg | ORAL_TABLET | Freq: Two times a day (BID) | ORAL | Status: DC
  Administered 2014-02-26 – 2014-03-02 (×9): 12.5 mg via ORAL
  Filled 2014-02-24 (×16): qty 1

## 2014-02-24 MED ORDER — GLYCOPYRROLATE 0.2 MG/ML IJ SOLN
INTRAMUSCULAR | Status: DC | PRN
  Administered 2014-02-24 (×4): .1 mg via INTRAVENOUS

## 2014-02-24 MED ORDER — HEMOSTATIC AGENTS (NO CHARGE) OPTIME
TOPICAL | Status: DC | PRN
  Administered 2014-02-24: 1 via TOPICAL

## 2014-02-24 MED ORDER — DOPAMINE-DEXTROSE 3.2-5 MG/ML-% IV SOLN
0.0000 ug/kg/min | INTRAVENOUS | Status: DC
  Administered 2014-02-25: 4 ug/kg/min via INTRAVENOUS
  Filled 2014-02-24: qty 250

## 2014-02-24 MED ORDER — METHYLPREDNISOLONE SODIUM SUCC 125 MG IJ SOLR
INTRAMUSCULAR | Status: AC
  Filled 2014-02-24: qty 2

## 2014-02-24 MED ORDER — METHYLPREDNISOLONE SODIUM SUCC 125 MG IJ SOLR
INTRAMUSCULAR | Status: DC | PRN
  Administered 2014-02-24: 125 mg via INTRAVENOUS

## 2014-02-24 MED ORDER — MORPHINE SULFATE 2 MG/ML IJ SOLN
2.0000 mg | INTRAMUSCULAR | Status: DC | PRN
  Administered 2014-02-24: 2 mg via INTRAVENOUS
  Administered 2014-02-25: 4 mg via INTRAVENOUS
  Administered 2014-02-26: 2 mg via INTRAVENOUS
  Filled 2014-02-24 (×3): qty 1
  Filled 2014-02-24 (×2): qty 2

## 2014-02-24 MED ORDER — SODIUM CHLORIDE 0.9 % IV SOLN: 250.0000 mL | INTRAVENOUS | Status: DC

## 2014-02-24 MED ORDER — ONDANSETRON HCL 4 MG/2ML IJ SOLN: 4.0000 mg | Freq: Four times a day (QID) | INTRAMUSCULAR | Status: DC | PRN

## 2014-02-24 MED ORDER — SODIUM CHLORIDE 0.9 % IV SOLN
20.0000 ug | Freq: Once | INTRAVENOUS | Status: AC
  Administered 2014-02-24: 20 ug via INTRAVENOUS
  Filled 2014-02-24: qty 5

## 2014-02-24 MED ORDER — GLYCOPYRROLATE 0.2 MG/ML IJ SOLN
INTRAMUSCULAR | Status: AC
  Filled 2014-02-24: qty 2

## 2014-02-24 MED ORDER — SODIUM CHLORIDE 0.9 % IV SOLN
1.0000 g/h | INTRAVENOUS | Status: DC
  Filled 2014-02-24: qty 20

## 2014-02-24 MED ORDER — SODIUM CHLORIDE 0.9 % IV SOLN: 1.0000 g/h | INTRAVENOUS | Status: DC

## 2014-02-24 MED ORDER — COAGULATION FACTOR VIIA RECOMB 1 MG IV SOLR
45.0000 ug/kg | Freq: Once | INTRAVENOUS | Status: AC
  Administered 2014-02-24: 4000 ug via INTRAVENOUS
  Filled 2014-02-24: qty 4

## 2014-02-24 MED ORDER — HEPARIN SODIUM (PORCINE) 1000 UNIT/ML IJ SOLN
INTRAMUSCULAR | Status: DC | PRN
  Administered 2014-02-24: 4000 [IU] via INTRAVENOUS
  Administered 2014-02-24: 29000 [IU] via INTRAVENOUS

## 2014-02-24 MED ORDER — SODIUM CHLORIDE 0.9 % IJ SOLN
INTRAMUSCULAR | Status: DC | PRN
  Administered 2014-02-24 (×3): 1 mL via TOPICAL

## 2014-02-24 MED ORDER — OXYCODONE HCL 5 MG PO TABS
5.0000 mg | ORAL_TABLET | ORAL | Status: DC | PRN
  Administered 2014-02-25: 5 mg via ORAL
  Administered 2014-02-25: 10 mg via ORAL
  Administered 2014-02-25 (×2): 5 mg via ORAL
  Administered 2014-02-26 – 2014-02-27 (×4): 10 mg via ORAL
  Filled 2014-02-24 (×4): qty 2
  Filled 2014-02-24 (×3): qty 1
  Filled 2014-02-24: qty 2

## 2014-02-24 MED ORDER — PHENYLEPHRINE HCL 10 MG/ML IJ SOLN
INTRAMUSCULAR | Status: DC | PRN
  Administered 2014-02-24 (×2): 40 ug via INTRAVENOUS

## 2014-02-24 MED ORDER — VANCOMYCIN HCL IN DEXTROSE 1-5 GM/200ML-% IV SOLN
1000.0000 mg | Freq: Two times a day (BID) | INTRAVENOUS | Status: AC
  Administered 2014-02-24 – 2014-02-26 (×4): 1000 mg via INTRAVENOUS
  Filled 2014-02-24 (×4): qty 200

## 2014-02-24 MED ORDER — PHENYLEPHRINE 40 MCG/ML (10ML) SYRINGE FOR IV PUSH (FOR BLOOD PRESSURE SUPPORT)
PREFILLED_SYRINGE | INTRAVENOUS | Status: AC
  Filled 2014-02-24: qty 10

## 2014-02-24 MED ORDER — METOPROLOL TARTRATE 25 MG/10 ML ORAL SUSPENSION
12.5000 mg | Freq: Two times a day (BID) | ORAL | Status: DC
  Administered 2014-02-28: 12.5 mg
  Filled 2014-02-24 (×15): qty 5

## 2014-02-24 MED ORDER — MIDAZOLAM HCL 5 MG/5ML IJ SOLN
INTRAMUSCULAR | Status: DC | PRN
  Administered 2014-02-24 (×4): 1 mg via INTRAVENOUS
  Administered 2014-02-24: 2 mg via INTRAVENOUS
  Administered 2014-02-24: 1 mg via INTRAVENOUS
  Administered 2014-02-24: 2 mg via INTRAVENOUS
  Administered 2014-02-24: 1 mg via INTRAVENOUS

## 2014-02-24 SURGICAL SUPPLY — 121 items
ADAPTER CARDIO PERF ANTE/RETRO (ADAPTER) ×4 IMPLANT
APPLICATOR COTTON TIP 6IN STRL (MISCELLANEOUS) ×4 IMPLANT
APPLICATOR TIP COSEAL (VASCULAR PRODUCTS) ×12 IMPLANT
ATTRACTOMAT 16X20 MAGNETIC DRP (DRAPES) ×4 IMPLANT
BAG DECANTER FOR FLEXI CONT (MISCELLANEOUS) ×4 IMPLANT
BANDAGE HEMOSTAT MRDH 4X4 STRL (MISCELLANEOUS) ×2 IMPLANT
BLADE STERNUM SYSTEM 6 (BLADE) ×4 IMPLANT
BLADE SURG 10 STRL SS (BLADE) ×4 IMPLANT
BLADE SURG 15 STRL LF DISP TIS (BLADE) IMPLANT
BLADE SURG 15 STRL SS (BLADE)
BNDG HEMOSTAT MRDH 4X4 STRL (MISCELLANEOUS) ×4
CANISTER SUCTION 2500CC (MISCELLANEOUS) ×4 IMPLANT
CANNULA GUNDRY RCSP 15FR (MISCELLANEOUS) ×8 IMPLANT
CATH RETROPLEGIA CORONARY 14FR (CATHETERS) IMPLANT
CATH ROBINSON RED A/P 18FR (CATHETERS) ×12 IMPLANT
CATH/SQUID NICHOLS JEHLE COR (CATHETERS) ×4 IMPLANT
CAUTERY EYE LOW TEMP 1300F FIN (OPHTHALMIC RELATED) ×4 IMPLANT
CAUTERY SURG HI TEMP FINE TIP (MISCELLANEOUS) ×4 IMPLANT
CLIP FOGARTY SPRING 6M (CLIP) IMPLANT
CLIP TI WIDE RED SMALL 24 (CLIP) ×4 IMPLANT
CONT SPEC 4OZ CLIKSEAL STRL BL (MISCELLANEOUS) ×8 IMPLANT
COUNTER NEEDLE 20 DBL MAG RED (NEEDLE) ×4 IMPLANT
COVER SURGICAL LIGHT HANDLE (MISCELLANEOUS) ×8 IMPLANT
CRADLE DONUT ADULT HEAD (MISCELLANEOUS) ×4 IMPLANT
DRAPE CARDIOVASCULAR INCISE (DRAPES) ×2
DRAPE SLUSH/WARMER DISC (DRAPES) IMPLANT
DRAPE SRG 135X102X78XABS (DRAPES) ×2 IMPLANT
DRSG AQUACEL AG ADV 3.5X14 (GAUZE/BANDAGES/DRESSINGS) ×4 IMPLANT
ELECT CAUTERY BLADE 6.4 (BLADE) IMPLANT
ELECT REM PT RETURN 9FT ADLT (ELECTROSURGICAL) ×8
ELECTRODE REM PT RTRN 9FT ADLT (ELECTROSURGICAL) ×4 IMPLANT
GAUZE SPONGE 4X4 12PLY STRL (GAUZE/BANDAGES/DRESSINGS) ×4 IMPLANT
GLOVE BIO SURGEON STRL SZ 6 (GLOVE) ×8 IMPLANT
GLOVE BIO SURGEON STRL SZ 6.5 (GLOVE) ×3 IMPLANT
GLOVE BIO SURGEONS STRL SZ 6.5 (GLOVE) ×1
GLOVE BIOGEL M 6.5 STRL (GLOVE) ×8 IMPLANT
GLOVE BIOGEL M STER SZ 6 (GLOVE) ×4 IMPLANT
GLOVE BIOGEL PI IND STRL 6 (GLOVE) ×6 IMPLANT
GLOVE BIOGEL PI IND STRL 7.0 (GLOVE) ×10 IMPLANT
GLOVE BIOGEL PI INDICATOR 6 (GLOVE) ×6
GLOVE BIOGEL PI INDICATOR 7.0 (GLOVE) ×10
GOWN STRL REUS W/ TWL LRG LVL3 (GOWN DISPOSABLE) ×16 IMPLANT
GOWN STRL REUS W/TWL LRG LVL3 (GOWN DISPOSABLE) ×16
HANDLE STAPLE ENDO GIA SHORT (STAPLE) ×2
HEMOSTAT POWDER SURGIFOAM 1G (HEMOSTASIS) IMPLANT
HEMOSTAT SURGICEL 2X14 (HEMOSTASIS) IMPLANT
INSERT FOGARTY XLG (MISCELLANEOUS) IMPLANT
KIT BASIN OR (CUSTOM PROCEDURE TRAY) ×4 IMPLANT
KIT ROOM TURNOVER OR (KITS) ×4 IMPLANT
KIT SUCTION CATH 14FR (SUCTIONS) IMPLANT
LINE VENT (MISCELLANEOUS) ×4 IMPLANT
LOOP VESSEL MINI RED (MISCELLANEOUS) ×4 IMPLANT
LOOP VESSEL SUPERMAXI WHITE (MISCELLANEOUS) ×4 IMPLANT
MARKER GRAFT CORONARY BYPASS (MISCELLANEOUS) IMPLANT
NEEDLE AORTIC AIR ASPIRATING (NEEDLE) ×4 IMPLANT
NS IRRIG 1000ML POUR BTL (IV SOLUTION) ×20 IMPLANT
PACK OPEN HEART (CUSTOM PROCEDURE TRAY) ×4 IMPLANT
PAD ARMBOARD 7.5X6 YLW CONV (MISCELLANEOUS) ×8 IMPLANT
PENCIL BUTTON HOLSTER BLD 10FT (ELECTRODE) IMPLANT
RELOAD ENDO GIA 30 2.5 (STAPLE) ×4 IMPLANT
SEALANT SURG COSEAL 8ML (VASCULAR PRODUCTS) ×12 IMPLANT
SET CARDIOPLEGIA MPS 5001102 (MISCELLANEOUS) ×4 IMPLANT
SPONGE LAP 4X18 X RAY DECT (DISPOSABLE) ×4 IMPLANT
STAPLER ENDO GIA 12MM SHORT (STAPLE) ×2 IMPLANT
STAPLER VISISTAT 35W (STAPLE) ×4 IMPLANT
STOPCOCK 4 WAY LG BORE MALE ST (IV SETS) ×4 IMPLANT
SUCKER INTRACARDIAC WEIGHTED (SUCKER) ×4 IMPLANT
SURGIFLO W/THROMBIN 8M KIT (HEMOSTASIS) ×4 IMPLANT
SUT ETHIBON 2 0 V 52N 30 (SUTURE) ×12 IMPLANT
SUT ETHIBON EXCEL 2-0 V-5 (SUTURE) IMPLANT
SUT ETHIBOND 2 0 SH (SUTURE)
SUT ETHIBOND 2 0 SH 36X2 (SUTURE) IMPLANT
SUT ETHIBOND 2 0 V4 (SUTURE) IMPLANT
SUT ETHIBOND 2 0V4 GREEN (SUTURE) IMPLANT
SUT ETHIBOND V-5 VALVE (SUTURE) IMPLANT
SUT PROLENE 3 0 SH 1 (SUTURE) IMPLANT
SUT PROLENE 3 0 SH DA (SUTURE) ×16 IMPLANT
SUT PROLENE 3 0 SH1 36 (SUTURE) ×4 IMPLANT
SUT PROLENE 4 0 RB 1 (SUTURE) ×38
SUT PROLENE 4 0 SH DA (SUTURE) ×28 IMPLANT
SUT PROLENE 4-0 RB1 .5 CRCL 36 (SUTURE) ×32 IMPLANT
SUT PROLENE 4-0 RB1 18X2 ARM (SUTURE) ×6 IMPLANT
SUT PROLENE 5 0 C 1 36 (SUTURE) ×52 IMPLANT
SUT PROLENE 5 0 CC 1 (SUTURE) ×20 IMPLANT
SUT PROLENE 6 0 C 1 24 (SUTURE) ×4 IMPLANT
SUT PROLENE 6 0 CC (SUTURE) ×28 IMPLANT
SUT SILK  1 MH (SUTURE) ×4
SUT SILK 1 MH (SUTURE) ×4 IMPLANT
SUT SILK 1 TIES 10X30 (SUTURE) ×4 IMPLANT
SUT SILK 2 0 (SUTURE) ×2
SUT SILK 2 0 SH CR/8 (SUTURE) ×4 IMPLANT
SUT SILK 2-0 18XBRD TIE 12 (SUTURE) ×2 IMPLANT
SUT SILK 3 0 SH CR/8 (SUTURE) ×4 IMPLANT
SUT SILK 4 0 (SUTURE) ×2
SUT SILK 4-0 18XBRD TIE 12 (SUTURE) ×2 IMPLANT
SUT STEEL 6MS V (SUTURE) IMPLANT
SUT STEEL STERNAL CCS#1 18IN (SUTURE) ×4 IMPLANT
SUT STEEL SZ 6 DBL 3X14 BALL (SUTURE) ×4 IMPLANT
SUT TEM PAC WIRE 2 0 SH (SUTURE) ×16 IMPLANT
SUT VIC AB 1 CTX 18 (SUTURE) ×4 IMPLANT
SUT VIC AB 1 CTX 36 (SUTURE)
SUT VIC AB 1 CTX36XBRD ANBCTR (SUTURE) IMPLANT
SUT VIC AB 2-0 CT1 27 (SUTURE) ×2
SUT VIC AB 2-0 CT1 TAPERPNT 27 (SUTURE) ×2 IMPLANT
SUT VIC AB 2-0 CTX 27 (SUTURE) ×8 IMPLANT
SUT VIC AB 3-0 X1 27 (SUTURE) IMPLANT
SYR 10ML KIT SKIN ADHESIVE (MISCELLANEOUS) IMPLANT
SYSTEM SAHARA CHEST DRAIN ATS (WOUND CARE) ×4 IMPLANT
TAPE CLOTH SURG 4X10 WHT LF (GAUZE/BANDAGES/DRESSINGS) ×4 IMPLANT
TAPE PAPER 2X10 WHT MICROPORE (GAUZE/BANDAGES/DRESSINGS) ×4 IMPLANT
TOWEL OR 17X24 6PK STRL BLUE (TOWEL DISPOSABLE) ×4 IMPLANT
TOWEL OR 17X26 10 PK STRL BLUE (TOWEL DISPOSABLE) ×4 IMPLANT
TRAY FOLEY IC TEMP SENS 14FR (CATHETERS) ×4 IMPLANT
TUBE CONNECTING 12'X1/4 (SUCTIONS) ×1
TUBE CONNECTING 12X1/4 (SUCTIONS) ×3 IMPLANT
TUBING ART PRESS 48 MALE/FEM (TUBING) ×8 IMPLANT
UNDERPAD 30X30 INCONTINENT (UNDERPADS AND DIAPERS) ×4 IMPLANT
VALVE AOR 27XLF MSTR (Prosthesis & Implant Heart) ×2 IMPLANT
VALVE AORTIC COND (Prosthesis & Implant Heart) ×2 IMPLANT
WATER STERILE IRR 1000ML POUR (IV SOLUTION) ×8 IMPLANT
YANKAUER SUCT BULB TIP NO VENT (SUCTIONS) ×4 IMPLANT

## 2014-02-24 NOTE — OR Nursing (Signed)
Second call to SICU at 1533.

## 2014-02-24 NOTE — Anesthesia Postprocedure Evaluation (Signed)
  Anesthesia Post-op Note  Patient: Jeffrey [name]  Procedure(s) Performed: Procedure(s): BENTALL PROCEDURE (N/A) INTRAOPERATIVE TRANSESOPHAGEAL ECHOCARDIOGRAM (N/A)  Patient Location: SICU  Anesthesia Type:General  Level of Consciousness: sedated and Patient remains intubated per anesthesia plan  Airway and Oxygen Therapy: Patient remains intubated per anesthesia plan and Patient placed on Ventilator (see vital sign flow sheet for setting)  Post-op Pain: none  Post-op Assessment: Post-op Vital signs reviewed, Patient's Cardiovascular Status Stable, Respiratory Function Stable, Patent Airway, No signs of Nausea or vomiting and Pain level controlled  Post-op Vital Signs: stable  Last Vitals:  Filed Vitals:   02/24/14 1614  BP:   Pulse:   Temp:   Resp: 18    Complications: No apparent anesthesia complications

## 2014-02-24 NOTE — Progress Notes (Signed)
  Echocardiogram Echocardiogram Transesophageal has been performed.  Jeffrey [name] 02/24/2014, 8:48 AM

## 2014-02-24 NOTE — Brief Op Note (Signed)
02/18/2014 - 02/24/2014  1:13 PM  PATIENT:  Jeffrey [name]  48 y.o. male  PRE-OPERATIVE DIAGNOSIS:  1. SEVERE AI 2. ASCENDING THORACIC AORTIC ANEURYSM  POST-OPERATIVE DIAGNOSIS:  1. SEVERE AI 2. ASCENDING THORACIC AORTIC ANEURYSM  PROCEDURE: INTRAOPERATIVE TRANSESOPHAGEAL ECHOCARDIOGRAM, BENTALL PROCEDURE  (using a Mechanical Valve, size 27 mm)  SURGEON:  Surgeon(s) and Role:    * Kerin Perna, MD - Primary  PHYSICIAN ASSISTANT: Doree Fudge PA-C  ASSISTANTS: Minda Ditto RNFA  ANESTHESIA:   general  EBL:  Total I/O In: 950 [I.V.:950] Out: 300 [Urine:300]  BLOOD ADMINISTERED:One FFP and One PLTS  DRAINS: Chest tubes placed in the mediastinal and pleural spaces   SPECIMEN:  Source of Specimen:  Native aortic valve leaflets and portion of ascending thoracic aortic aneurysm  DISPOSITION OF SPECIMEN:  PATHOLOGY  COUNTS CORRECT:  YES  DICTATION: .Dragon Dictation  PLAN OF CARE: Admit to inpatient   PATIENT DISPOSITION:  ICU - intubated and hemodynamically stable.   Delay start of Pharmacological VTE agent (>24hrs) due to surgical blood loss or risk of bleeding: yes  BASELINE WEIGHT: 95.3 kg  Aortic Valve Etiology   Aortic Insufficiency:  Severe  Aortic Valve Disease:  No.  Aortic Stenosis:  No.  Etiology (Choose at least one and up to  5 etiologies):  Primary Aortic Disease, Atherosclerotic Aneurysm  Aortic Valve  Procedure Performed:  Replacement: Yes.  Mechanical Valve. Implant Model Number:[id number], Size: 27 mm, Unique Device Identifier:[id number]  Repair/Reconstruction: Yes.  Root replacement with valved conduit (Bentall) Mechanical Valve. Implant Model Number:[id number], Size:27 mm, Unique Device Identifier:[id number]  Aortic Annular Enlargement: Yes.

## 2014-02-24 NOTE — Progress Notes (Signed)
CT surgery p.m. Rounds  Patient sedated on ventilator after Bentall procedure and replacement of ascending aorta for severe AI and 7 cm ascending aneurysm Hemodynamic stable on inotropes for LV dysfunction from severe long-standing AI Sinus rhythm Chest tube drainage less than 100 cc per hour

## 2014-02-24 NOTE — Progress Notes (Signed)
ANTIBIOTIC CONSULT NOTE - INITIAL  Pharmacy Consult for Vancomycin Indication: Surgical Prophylaxis for 48 hours post-op  No Known Allergies Patient Measurements: Height: 5\' 10"  (177.8 cm) Weight: 210 lb 1.6 oz (95.3 kg) IBW/kg (Calculated) : 73 Vital Signs: Temp: 98.3 F (36.8 C) (11/16 0606) Temp Source: Oral (11/16 0606) BP: 129/59 mmHg (11/16 0606) Pulse Rate: 77 (11/16 0725) Intake/Output from previous day: 11/15 0701 - 11/16 0700 In: 483 [P.O.:480; I.V.:3] Out: 1700 [Urine:1700] Intake/Output from this shift: Total I/O In: 6143 [I.V.:3550; Blood:2593] Out: 3979 [Urine:2250; Blood:1729] Labs:  Recent Labs  02/23/14 0337 02/23/14 0820 02/24/14 0005  02/24/14 1041 02/24/14 1316 02/24/14 1322 02/24/14 1454  WBC 5.4 6.1 5.2  --   --   --   --   --   HGB 13.9 14.8 13.5  < > 9.2* 9.1* 8.8* 8.5*  PLT 154 156 147*  --   --  41*  --   --   CREATININE 1.04 1.05  --   < > 0.70  --  0.80 0.70  < > = values in this interval not displayed. Estimated Creatinine Clearance: 130.8 mL/min (by C-G formula based on Cr of 0.7).  Microbiology: Recent Results (from the past 720 hour(s))  Surgical pcr screen     Status: Abnormal   Collection Time: 02/23/14 10:32 PM  Result Value Ref Range Status   MRSA, PCR NEGATIVE NEGATIVE Final   Staphylococcus aureus POSITIVE (A) NEGATIVE Final    Comment:        The Xpert SA Assay (FDA approved for NASAL specimens in patients over 19 years of age), is one component of a comprehensive surveillance program.  Test performance has been validated by [employer] for patients greater than or equal to 25 year old. It is not intended to diagnose infection nor to guide or monitor treatment. RESULT CALLED TO, READ BACK BY AND VERIFIED WITH: Sherlean Foot [id number] [facility]     Medical History: Past Medical History  Diagnosis Date  . Gout     Medications:  Anti-infectives    Start     Dose/Rate Route Frequency Ordered Stop   02/24/14  1730  cefUROXime (ZINACEF) 1.5 g in dextrose 5 % 50 mL IVPB     1.5 g100 mL/hr over 30 Minutes Intravenous Every 12 hours 02/24/14 1602 02/26/14 1729   02/24/14 0400  vancomycin (VANCOCIN) 1,500 mg in sodium chloride 0.9 % 250 mL IVPB     1,500 mg125 mL/hr over 120 Minutes Intravenous To Surgery 02/23/14 1527 02/24/14 0900   02/24/14 0400  cefUROXime (ZINACEF) 1.5 g in dextrose 5 % 50 mL IVPB     1.5 g100 mL/hr over 30 Minutes Intravenous To Surgery 02/23/14 1527 02/24/14 1447   02/24/14 0400  cefUROXime (ZINACEF) 750 mg in dextrose 5 % 50 mL IVPB  Status:  Discontinued     750 mg100 mL/hr over 30 Minutes Intravenous To Surgery 02/23/14 1527 02/24/14 1602     Assessment: 48 YOM s/p Bentall procedure with mechanical AVR to receive vancomycin for surgical prophylaxis x48 hours post-op per pharmacy dosing. He received at 1500mg  vancomycin dose pre-op at 0800 AM. SCr is within normal limits and has been stable with current CrCl >100 mL/min. Weight 95.3 kg.   Goal of Therapy:  Vancomycin trough level 10-15 mcg/ml  Plan:  1. Vancomycin 1g IV q12h x 48 hours.  2. Monitor clinical status and renal function.    Link Snuffer, PharmD, BCPS Clinical Pharmacist [phone] 02/24/2014,4:04 PM

## 2014-02-24 NOTE — Transfer of Care (Addendum)
Immediate Anesthesia Transfer of Care Note  Patient: Jeffrey [name]  Procedure(s) Performed: Procedure(s): BENTALL PROCEDURE (N/A) INTRAOPERATIVE TRANSESOPHAGEAL ECHOCARDIOGRAM (N/A)  Patient Location: SICU  Anesthesia Type:General  Level of Consciousness: sedated and Patient remains intubated per anesthesia plan  Airway & Oxygen Therapy: Patient remains intubated per anesthesia plan  Post-op Assessment: Report given to PACU RN and Post -op Vital signs reviewed and stable  Post vital signs: Reviewed and stable  Complications: No apparent anesthesia complications   Bp 109/53, HR 90

## 2014-02-24 NOTE — OR Nursing (Signed)
SICU first call @ 1452

## 2014-02-24 NOTE — Anesthesia Preprocedure Evaluation (Addendum)
Anesthesia Evaluation  Patient identified by MRN, date of birth, ID band Patient awake    Reviewed: Allergy & Precautions, H&P , NPO status , Patient's Chart, lab work & pertinent test results  Airway Mallampati: II  TM Distance: >3 FB Neck ROM: Full    Dental  (+) Teeth Intact, Dental Advisory Given   Pulmonary shortness of breath,    Pulmonary exam normal       Cardiovascular + Peripheral Vascular Disease and +CHF Rhythm:Regular Rate:Normal     Neuro/Psych    GI/Hepatic negative GI ROS, Neg liver ROS,   Endo/Other  negative endocrine ROS  Renal/GU negative Renal ROS  negative genitourinary   Musculoskeletal negative musculoskeletal ROS (+)   Abdominal   Peds  Hematology negative hematology ROS (+)   Anesthesia Other Findings   Reproductive/Obstetrics negative OB ROS                            Anesthesia Physical Anesthesia Plan  ASA: III  Anesthesia Plan: General   Post-op Pain Management:    Induction: Intravenous  Airway Management Planned: Oral ETT  Additional Equipment: Arterial line, CVP, 3D TEE, PA Cath and Ultrasound Guidance Line Placement  Intra-op Plan: Utilization Of Total Body Hypothermia per surgeon request and Delibrate Circulatory arrest per surgeon request  Post-operative Plan: Post-operative intubation/ventilation  Informed Consent: I have reviewed the patients History and Physical, chart, labs and discussed the procedure including the risks, benefits and alternatives for the proposed anesthesia with the patient or authorized representative who has indicated his/her understanding and acceptance.   Dental advisory given  Plan Discussed with: Surgeon and CRNA  Anesthesia Plan Comments:        Anesthesia Quick Evaluation

## 2014-02-24 NOTE — OR Nursing (Signed)
Volunteer call @ [phone]

## 2014-02-24 NOTE — Progress Notes (Signed)
Recruitment maneuver performed on patient of PCV of 30, PEEP of 5, rate 10, FiO2 100%, and iTime of 3.00 for 2 minutes. Patient tolerated well. Vitals stable. Sats went from 90% to 99%.

## 2014-02-24 NOTE — Progress Notes (Signed)
The patient was examined and preop studies reviewed. There has been no change from the prior exam and the patient is ready for surgery.   Plan Bentall root replacement on R Fur[place] today

## 2014-02-24 NOTE — Anesthesia Procedure Notes (Addendum)
Procedure Name: Intubation Date/Time: 02/24/2014 8:26 AM Performed by: Minus Liberty Pre-anesthesia Checklist: Patient identified, Emergency Drugs available, Patient being monitored, Suction available and Timeout performed Patient Re-evaluated:Patient Re-evaluated prior to inductionOxygen Delivery Method: Circle system utilized Preoxygenation: Pre-oxygenation with 100% oxygen Intubation Type: IV induction Ventilation: Mask ventilation without difficulty Laryngoscope Size: Mac and 4 Grade View: Grade II Tube type: Oral Tube size: 8.0 mm Number of attempts: 1 Airway Equipment and Method: Stylet Secured at: 22 cm Tube secured with: Tape Dental Injury: Teeth and Oropharynx as per pre-operative assessment     Procedures: Right IJ Theone Murdoch Catheter Insertion: [id number]: The patient was identified and consent obtained.  TO was performed, and full barrier precautions were used.  The skin was anesthetized and a large bore 9 [place] cordis catheter was carefully inserted. Theone Murdoch was inserted and will be floated in OR and Dr. Michelle Piper aware. The patient tolerated the procedure well.   CE

## 2014-02-25 ENCOUNTER — Encounter (HOSPITAL_COMMUNITY): Payer: Self-pay | Admitting: Cardiothoracic Surgery

## 2014-02-25 ENCOUNTER — Inpatient Hospital Stay (HOSPITAL_COMMUNITY): Payer: BC Managed Care – PPO

## 2014-02-25 LAB — POCT I-STAT 3, ART BLOOD GAS (G3+)
ACID-BASE DEFICIT: 1 mmol/L (ref 0.0–2.0)
ACID-BASE DEFICIT: 2 mmol/L (ref 0.0–2.0)
BICARBONATE: 23.6 meq/L (ref 20.0–24.0)
Bicarbonate: 22.1 mEq/L (ref 20.0–24.0)
O2 SAT: 97 %
O2 Saturation: 98 %
PO2 ART: 101 mmHg — AB (ref 80.0–100.0)
Patient temperature: 37.6
TCO2: 25 mmol/L (ref 0–100)
TCO2: 23 mmol/L (ref 0–100)
pCO2 arterial: 38.6 mmHg (ref 35.0–45.0)
pCO2 arterial: 37.2 mmHg (ref 35.0–45.0)
pH, Arterial: 7.396 (ref 7.350–7.450)
pH, Arterial: 7.385 (ref 7.350–7.450)
pO2, Arterial: 98 mmHg (ref 80.0–100.0)

## 2014-02-25 LAB — GLUCOSE, CAPILLARY
GLUCOSE-CAPILLARY: 113 mg/dL — AB (ref 70–99)
GLUCOSE-CAPILLARY: 96 mg/dL (ref 70–99)
GLUCOSE-CAPILLARY: 107 mg/dL — AB (ref 70–99)
GLUCOSE-CAPILLARY: 160 mg/dL — AB (ref 70–99)
GLUCOSE-CAPILLARY: 161 mg/dL — AB (ref 70–99)
Glucose-Capillary: 108 mg/dL — ABNORMAL HIGH (ref 70–99)
Glucose-Capillary: 120 mg/dL — ABNORMAL HIGH (ref 70–99)
Glucose-Capillary: 121 mg/dL — ABNORMAL HIGH (ref 70–99)
Glucose-Capillary: 129 mg/dL — ABNORMAL HIGH (ref 70–99)
Glucose-Capillary: 142 mg/dL — ABNORMAL HIGH (ref 70–99)
Glucose-Capillary: 151 mg/dL — ABNORMAL HIGH (ref 70–99)
Glucose-Capillary: 167 mg/dL — ABNORMAL HIGH (ref 70–99)
Glucose-Capillary: 101 mg/dL — ABNORMAL HIGH (ref 70–99)
Glucose-Capillary: 107 mg/dL — ABNORMAL HIGH (ref 70–99)
Glucose-Capillary: 75 mg/dL (ref 70–99)
Glucose-Capillary: 83 mg/dL (ref 70–99)
Glucose-Capillary: 93 mg/dL (ref 70–99)
Glucose-Capillary: 97 mg/dL (ref 70–99)

## 2014-02-25 LAB — CARBOXYHEMOGLOBIN
Carboxyhemoglobin: 1.1 % (ref 0.5–1.5)
Carboxyhemoglobin: 1.5 % (ref 0.5–1.5)
Methemoglobin: 1.1 % (ref 0.0–1.5)
Methemoglobin: 1 % (ref 0.0–1.5)
O2 Saturation: 61.7 %
O2 Saturation: 65.2 %
Total hemoglobin: 8.4 g/dL — ABNORMAL LOW (ref 13.5–18.0)
Total hemoglobin: 7.8 g/dL — ABNORMAL LOW (ref 13.5–18.0)

## 2014-02-25 LAB — PREPARE PLATELET PHERESIS
Unit division: 0
Unit division: 0

## 2014-02-25 LAB — BASIC METABOLIC PANEL
Anion gap: 11 (ref 5–15)
BUN: 9 mg/dL (ref 6–23)
CALCIUM: 7.8 mg/dL — AB (ref 8.4–10.5)
CO2: 22 meq/L (ref 19–32)
Chloride: 109 mEq/L (ref 96–112)
Creatinine, Ser: 0.81 mg/dL (ref 0.50–1.35)
GFR calc Af Amer: >90 mL/min (ref >90)
GFR calc non Af Amer: >90 mL/min (ref >90)
GLUCOSE: 98 mg/dL (ref 70–99)
POTASSIUM: 4.2 meq/L (ref 3.7–5.3)
SODIUM: 142 meq/L (ref 137–147)

## 2014-02-25 LAB — PREPARE FRESH FROZEN PLASMA
Unit division: 0
Unit division: 0

## 2014-02-25 LAB — CBC
HCT: 26.3 % — ABNORMAL LOW (ref 39.0–52.0)
HCT: 24 % — ABNORMAL LOW (ref 39.0–52.0)
Hemoglobin: 9.3 g/dL — ABNORMAL LOW (ref 13.0–17.0)
Hemoglobin: 8 g/dL — ABNORMAL LOW (ref 13.0–17.0)
MCH: 28.5 pg (ref 26.0–34.0)
MCH: 26.6 pg (ref 26.0–34.0)
MCHC: 35.4 g/dL (ref 30.0–36.0)
MCHC: 33.3 g/dL (ref 30.0–36.0)
MCV: 80.7 fL (ref 78.0–100.0)
MCV: 79.7 fL (ref 78.0–100.0)
PLATELETS: 95 10*3/uL — AB (ref 150–400)
Platelets: 113 10*3/uL — ABNORMAL LOW (ref 150–400)
RBC: 3.26 MIL/uL — AB (ref 4.22–5.81)
RBC: 3.01 MIL/uL — AB (ref 4.22–5.81)
RDW: 13.4 % (ref 11.5–15.5)
RDW: 13.9 % (ref 11.5–15.5)
WBC: 12.7 10*3/uL — ABNORMAL HIGH (ref 4.0–10.5)
WBC: 15 10*3/uL — AB (ref 4.0–10.5)

## 2014-02-25 LAB — POCT I-STAT, CHEM 8
BUN: 12 mg/dL (ref 6–23)
CALCIUM ION: 1.17 mmol/L (ref 1.12–1.23)
CHLORIDE: 103 meq/L (ref 96–112)
CREATININE: 0.8 mg/dL (ref 0.50–1.35)
GLUCOSE: 146 mg/dL — AB (ref 70–99)
HEMATOCRIT: 26 % — AB (ref 39.0–52.0)
Hemoglobin: 8.8 g/dL — ABNORMAL LOW (ref 13.0–17.0)
POTASSIUM: 4.3 meq/L (ref 3.7–5.3)
Sodium: 138 mEq/L (ref 137–147)
TCO2: 23 mmol/L (ref 0–100)

## 2014-02-25 LAB — PREPARE CRYOPRECIPITATE
Unit division: 0
Unit division: 0

## 2014-02-25 LAB — CREATININE, SERUM
Creatinine, Ser: 0.83 mg/dL (ref 0.50–1.35)
GFR calc non Af Amer: >90 mL/min (ref >90)

## 2014-02-25 LAB — MAGNESIUM
MAGNESIUM: 2.3 mg/dL (ref 1.5–2.5)
MAGNESIUM: 2.2 mg/dL (ref 1.5–2.5)

## 2014-02-25 MED ORDER — FUROSEMIDE 10 MG/ML IJ SOLN
10.0000 mg | Freq: Two times a day (BID) | INTRAMUSCULAR | Status: DC
  Administered 2014-02-25 – 2014-02-26 (×2): 10 mg via INTRAVENOUS
  Filled 2014-02-25: qty 2
  Filled 2014-02-25 (×2): qty 1
  Filled 2014-02-25: qty 2

## 2014-02-25 MED ORDER — CETYLPYRIDINIUM CHLORIDE 0.05 % MT LIQD
7.0000 mL | Freq: Four times a day (QID) | OROMUCOSAL | Status: DC
  Administered 2014-02-25 – 2014-02-26 (×7): 7 mL via OROMUCOSAL

## 2014-02-25 MED ORDER — CHLORHEXIDINE GLUCONATE 0.12 % MT SOLN
15.0000 mL | Freq: Two times a day (BID) | OROMUCOSAL | Status: DC
  Administered 2014-02-25 (×2): 15 mL via OROMUCOSAL
  Filled 2014-02-25 (×2): qty 15

## 2014-02-25 MED ORDER — INSULIN ASPART 100 UNIT/ML ~~LOC~~ SOLN: 0.0000 [IU] | SUBCUTANEOUS | Status: DC

## 2014-02-25 MED ORDER — INSULIN ASPART 100 UNIT/ML ~~LOC~~ SOLN
4.0000 [IU] | Freq: Three times a day (TID) | SUBCUTANEOUS | Status: DC
  Administered 2014-02-25 – 2014-02-26 (×4): 4 [IU] via SUBCUTANEOUS

## 2014-02-25 MED ORDER — INSULIN ASPART 100 UNIT/ML ~~LOC~~ SOLN
0.0000 [IU] | SUBCUTANEOUS | Status: DC
  Administered 2014-02-25 (×2): 2 [IU] via SUBCUTANEOUS
  Administered 2014-02-25 (×2): 4 [IU] via SUBCUTANEOUS
  Administered 2014-02-26 (×2): 2 [IU] via SUBCUTANEOUS
  Administered 2014-02-26: 4 [IU] via SUBCUTANEOUS
  Administered 2014-02-26 (×2): 2 [IU] via SUBCUTANEOUS

## 2014-02-25 MED FILL — Electrolyte-R (PH 7.4) Solution: INTRAVENOUS | Qty: 4000 | Status: AC

## 2014-02-25 MED FILL — Heparin Sodium (Porcine) Inj 1000 Unit/ML: INTRAMUSCULAR | Qty: 30 | Status: AC

## 2014-02-25 MED FILL — Sodium Chloride IV Soln 0.9%: INTRAVENOUS | Qty: 3000 | Status: AC

## 2014-02-25 MED FILL — Mannitol IV Soln 20%: INTRAVENOUS | Qty: 500 | Status: AC

## 2014-02-25 MED FILL — Lidocaine HCl IV Inj 20 MG/ML: INTRAVENOUS | Qty: 5 | Status: AC

## 2014-02-25 MED FILL — Sodium Bicarbonate IV Soln 8.4%: INTRAVENOUS | Qty: 100 | Status: AC

## 2014-02-25 MED FILL — Magnesium Sulfate Inj 50%: INTRAMUSCULAR | Qty: 10 | Status: AC

## 2014-02-25 MED FILL — Potassium Chloride Inj 2 mEq/ML: INTRAVENOUS | Qty: 40 | Status: AC

## 2014-02-25 MED FILL — Calcium Chloride Inj 10%: INTRAVENOUS | Qty: 10 | Status: AC

## 2014-02-25 NOTE — Progress Notes (Signed)
UR Completed.  Jeffrey [name] [name] [phone] 02/25/2014  

## 2014-02-25 NOTE — Procedures (Signed)
Extubation Procedure Note  Patient Details:   Name: Jeffrey [name] DOB: Aug 25, 1965 MRN: [id number]   Airway Documentation:     Evaluation  O2 sats: stable throughout Complications: No apparent complications Patient did tolerate procedure well. Bilateral Breath Sounds: Clear Suctioning: Airway Yes  Patient tolerated the rapid wean. VC .7 L and NIF -26. Positive for cuff leak. Patient extubated to a 4 Lpm nasal cannula. No signs of dyspnea or stridor. Patient instructed on the Incentive Spirometer achieving 600 mL, five times. Patient resting comfortably. RN at bedside.   Ancil Boozer 02/25/2014, 8:53 AM

## 2014-02-25 NOTE — Progress Notes (Signed)
1 Day Post-Op Procedure(s) (LRB): BENTALL PROCEDURE (N/A) INTRAOPERATIVE TRANSESOPHAGEAL ECHOCARDIOGRAM (N/A) Subjective: Awake on vent- patient was delirious and needed restraints and sedation overnite nsr stable hemodynamics Appears calm, moves all extremities pulses intact- remains on mil-dopamine for preop LV dysfunction Objective: Vital signs in last 24 hours: Temp:  [94.1 F (34.5 C)-100.2 F (37.9 C)] 100 F (37.8 C) (11/17 0715) Pulse Rate:  [80-90] 89 (11/17 0715) Cardiac Rhythm:  [-] Atrial paced (11/17 0400) Resp:  [12-23] 12 (11/17 0715) BP: (81-117)/(57-69) 99/64 mmHg (11/17 0700) SpO2:  [92 %-100 %] 99 % (11/17 0715) Arterial Line BP: (84-134)/(53-78) 114/59 mmHg (11/17 0715) FiO2 (%):  [40 %-60 %] 40 % (11/17 0504) Weight:  [217 lb 2.5 oz (98.5 kg)] 217 lb 2.5 oz (98.5 kg) (11/17 0645)  Hemodynamic parameters for last 24 hours: PAP: (23-31)/(15-22) 28/17 mmHg CO:  [3.6 L/min-5.9 L/min] 5 L/min CI:  [1.7 L/min/m2-2.8 L/min/m2] 2.4 L/min/m2  Intake/Output from previous day: 11/16 0701 - 11/17 0700 In: 9866.2 [I.V.:5130.7; Blood:2935.5; NG/GT:150; IV Piggyback:1650] Out: 8504 [Urine:5285; Emesis/NG output:140; Blood:1729; Chest Tube:1350] Intake/Output this shift:    Lungs clear AVR with sharp closure click extremities warm  Lab Results:  Recent Labs  02/24/14 2020 02/24/14 2022 02/25/14 0400  WBC 14.7*  --  12.7*  HGB 12.2* 12.2* 9.3*  HCT 35.4* 36.0* 26.3*  PLT 141*  --  113*   BMET:  Recent Labs  02/24/14 2036 02/25/14 0400  NA 139 142  K 3.9 4.2  CL 107 109  CO2 22 22  GLUCOSE 114* 98  BUN 10 9  CREATININE 0.79 0.81  CALCIUM 7.7* 7.8*    PT/INR:  Recent Labs  02/24/14 2020  LABPROT 16.9*  INR 1.36   ABG    Component Value Date/Time   PHART 7.396 02/25/2014 0357   HCO3 23.6 02/25/2014 0357   TCO2 25 02/25/2014 0357   ACIDBASEDEF 1.0 02/25/2014 0357   O2SAT 61.7 02/25/2014 0410   CBG (last 3)  No results for input(s):  GLUCAP in the last 72 hours.  Assessment/Plan: S/P Procedure(s) (LRB): BENTALL PROCEDURE (N/A) INTRAOPERATIVE TRANSESOPHAGEAL ECHOCARDIOGRAM (N/A) See progression orders extubate  LOS: 7 days    VAN TRIGT III,PETER 02/25/2014

## 2014-02-25 NOTE — Plan of Care (Signed)
Problem: Phase II - Intermediate Post-Op Goal: Wean to Extubate Outcome: Completed/Met Date Met:  02/25/14 Goal: Maintain Hemodynamic Stability Outcome: Progressing Goal: Pain controlled with appropriate interventions Outcome: Progressing

## 2014-02-25 NOTE — Progress Notes (Signed)
 of Fentanyl wasted in sink.  Zeb Comfort, RN witnessed.

## 2014-02-25 NOTE — Progress Notes (Signed)
POD # 1 Bentall  Up in chair, just back from a walk  Pain well controlled  BP 93/77 mmHg  Pulse 99  Temp(Src) 98.5 F (36.9 C) (Oral)  Resp 15  Ht 5\' 10"  (1.778 m)  Wt 217 lb 2.5 oz (98.5 kg)  BMI 31.16 kg/m2  SpO2 95% Milrinone at 0.2 dopamine @ 4   Intake/Output Summary (Last 24 hours) at 02/25/14 1918 Last data filed at 02/25/14 1900  Gross per 24 hour  Intake 4037.43 ml  Output   3710 ml  Net 327.43 ml   K= 4.3 Hct = 26  Doing well POD # 1

## 2014-02-25 NOTE — Op Note (Signed)
Jeffrey Gulling:  [name], Jeffrey [name]          ACCOUNT NO.:  [id number]  MEDICAL RECORD NO.:  [id number]  LOCATION:  2S12C                        FACILITY:  [facility]  PHYSICIAN:  Kerin PernaPeter Van Trigt, M.D.  DATE OF BIRTH:  06-29-1965  DATE OF PROCEDURE:  02/24/2014 DATE OF DISCHARGE:                              OPERATIVE REPORT   OPERATION: 1. Bentall aortic root replacement using a mechanical valve-conduit     and reimplantation of the coronary arteries (St. Jude 27 mm valve     conduit serial #[id number]). 2. Replacement of the ascending aorta to the proximal arch with a 30     mm Dacron graft. 3. Placement of left femoral A-line for blood pressure monitoring.  PREOPERATIVE DIAGNOSIS:  Severe aortic insufficiency with moderate LV dysfunction and a 7 cm fusiform ascending aneurysm.  POSTOPERATIVE DIAGNOSIS:  Severe aortic insufficiency with moderate LV dysfunction and a 7 cm fusiform ascending aneurysm.  SURGEON:  Kerin PernaPeter Van Trigt, MD  ASSISTANT:  Doree Fudgeonielle Zimmerman, PA-C and Minda Dittoerry Wagner, RNFA.  ANESTHESIA:  General by Achille RichAdam Hodierne, MD.  INDICATIONS:  The patient is a 48 year old gentleman, who presented to the hospital with symptoms of class 4 heart failure including abdominal bloating, edema, and shortness of breath.  Echocardiogram demonstrated severe aortic insufficiency with global LV dilatation and EF of 45%. CTA of the chest showed a large fusiform ascending aneurysm involving the root and extending up to the proximal arch.  The patient had a coronary angiogram which demonstrated no significant CAD with severe AI. The patient felt to be a candidate for combined aortic root replacement and replacement of the ascending aorta to the arch for the large fusiform aneurysm.  I examined the patient and reviewed the results of the above studies with the patient and family and discussed indications and expected benefits of a Bentall root replacement and replacement of the ascending aorta.  The  patient was recommended to have a mechanical valve replacement as the valve did not appear to be repairable.  The patient agreed to proceed with mechanical AVR due to his age less than 50 years and not having any contraindication to oral anticoagulation.  I discussed the diagnosis of aortic insufficiency with LV dysfunction as well as ascending aneurysm with the patient and discussed the natural history of these diseases if left treated without surgery.  I discussed the major aspects of the planned surgery including the use of general anesthesia and cardiopulmonary bypass, the use of hypothermic circulatory arrest, and expected postoperative recovery.  I discussed with the patient the risks to him of the operation including the risks of MI, stroke, bleeding, blood transfusion requirement, postoperative pleural effusions, postoperative pacemaker dependency, postoperative infection, and death.  He demonstrated his understanding and agreed to proceed with surgery under what I felt was an informed consent.  OPERATIVE PROCEDURE:  The patient was brought to the operating room and placed supine on the operating room table, where general anesthesia was induced under invasive hemodynamic monitoring.  The PA catheter demonstrated moderate-to-severe pulmonary hypertension.  A transesophageal echo probe was placed by the anesthesiology which demonstrated the preoperative diagnosis of severe aortic insufficiency with moderate LV dysfunction and a dilated LV chamber.  The patient was prepped and  draped as a sterile field.  A proper time-out was performed. An incision was made beneath the right clavicle for exposure of the right axillary artery for cannulation.  The pectoralis major was divided.  The pectoralis minor was divided.  A deep weighted retractor was placed.  The axillary artery was carefully dissected from the surrounding brachial plexus and the subclavian vein and circled with a vessel  loop.  A 4000 units of heparin were administered.  The artery was clamped proximally and distally and an arteriotomy was made.  A graft- cannula was then sewn end-to-side with running 5-0 Prolene.  This was an 8 mm Gore-Tex graft-cannula device.  The clamps were removed and hemostasis was achieved.  The cannula was then clamped distally.  The sternotomy was then completed and the pericardium was opened and suspended.  The aorta was inspected.  It was huge and dilated from the root up to the innominate artery.  A pursestring was placed in the right atrium.  Heparin was administered.  The patient was then cannulated with a two-stage venous cannula and was then connected to the cardiopulmonary bypass circuit from the right axillary inflow cannula and outflow from the right atrium.  A LV vent was placed via the right superior pulmonary vein.  A retrograde coronary sinus catheter was placed through a pursestring in the right atrium.  The aorta was carefully dissected from the pulmonary artery.  The patient was then cooled down to 20 degrees. While we are cooling, the crossclamp was applied just beneath the innominate artery.  An arteriotomy was made.  The valve was inspected. It was trileaflet with the leaflet margins very thickened.  There was also fenestrations in the leaflet which appeared to be not dependable for repair.  The valve leaflets were excised.  The aortic root was resected and the coronary buttons were developed and retracted to the side.  The aorta was divided just beneath the clamp to remove the ascending aneurysm.  The valve was sized to a 27 mm St. Jude valve and a St. Jude valve conduit with a 27 mm mechanical valve and a 30-mm graft was then brought to the field.  A 2-0 Ethibond pledgeted sutures were placed around the aortic annulus in a supra-annular position.  They were then placed with the sewing ring of the valve conduit and the conduit was seated and the sutures  were tied.  A light layer of CoSeal was placed around the valve annulus.  The coronary buttons were attached to the ascending Dacron graft with 5- 0 running Prolene.  The left main button was first attached and there was a good orientation and wide patency following completion of the anastomosis.  The right coronary button was then sewn to the anterior aspect of the graft with an opening again made with a handheld cautery using running 5-0 Prolene.  This anastomosis was again widely patent and properly oriented.  At this point, the patient had been cooled to 20 degrees and we used hypothermic circulatory arrest.  Blood was drained from the patient to the pump circuit and the crossclamp was removed.  The clamp was removed. The aorta was removed beneath the innominate artery which was encircled with a vessel loop and then clamped for antegrade cerebral perfusion. The Dacron graft was then divided and beveled with appropriate length and angle.  It was then sewn end to the proximal arch using running 4-0 Prolene for the initial layer which was then reinforced with interrupted 4-0 Prolene pledgeted  sutures around the inside of the anastomosis.  The running 4-0 Prolene was then completed around the anterior aspect of the anastomosis and this was again reinforced with several interrupted 4-0 pledgeted Prolene sutures.  The clamp on the innominate artery was then removed to provide systemic reperfusion.  The hypothermic circulatory arrest time was 36 minutes.  The patient had antegrade cerebral perfusion during this period of time except for 2-3 minutes.  The cerebral oximeter and cerebral saturations were monitored.  When the graft to the aortic anastomosis had been completed and flow was resumed, there was some areas of bleeding along the lateral aspect of the anastomosis where the graft was slightly larger than the aorta and these were repaired with some interrupted 4-0 Prolene pledgeted  sutures.  Otherwise, the coronary buttons appeared to be hemostatic as did the proximal suture line in the annulus.  The patient was rewarmed and reperfused.  Temporary pacing wires were applied.  The retrograde cardioplegia line was removed.  When the patient reached 26 degrees, he was cardioverted back to a regular rhythm.  The LV vent was removed when the patient was warmed to 34 degrees and the lungs were expanded and the ventilator was resumed.  The patient was then started on low-dose milrinone and dopamine.  When the patient was rewarmed to 37 degrees, he was successfully weaned off cardiopulmonary bypass.  There appeared to be preservation of LV function with still some remaining global hypokinesia.  The valve was competent.  There was no MR.  The patient was in a atrially paced sinus rhythm.  Protamine was administered.  The cannulas were removed from the heart. The patient's platelet count returned at 42,000 and platelets and FFP were provided to improve hemostasis.  The patient also had a fibrinogen level of 160 and 1 pooled cryoprecipitate transfusion was provided for improved hemostasis.  The patient remained hemodynamically stable.  The superior pericardial fat was closed over the aorta.  Anterior mediastinal and posterior mediastinal tubes were placed and brought through separate incisions.  The Gore-Tex graft-cannula was stapled above the anastomosis to the subclavian artery.  The right infraclavicular incision was irrigated and hemostasis was achieved.  It was closed with interrupted Vicryl for the muscle layer and a running 2- 0 Vicryl for the subcutaneous and skin staples for the skin.  The sternum was closed with interrupted steel wire.  The pectoralis fascia was closed with interrupted and running #1 Vicryl and the subcutaneous and skin layers were closed in running Vicryl.  Total cardiopulmonary bypass time was 214 minutes.  Circulatory arrest time was 36  minutes.     Kerin Perna, M.D.     PV/MEDQ  D:  02/24/2014  T:  02/25/2014  Job:  [id number]  cc:   Noralyn Pick. Eden Emms, MD, [facility]

## 2014-02-26 ENCOUNTER — Inpatient Hospital Stay (HOSPITAL_COMMUNITY): Payer: BC Managed Care – PPO

## 2014-02-26 LAB — COMPREHENSIVE METABOLIC PANEL
ALT: 25 U/L (ref 0–53)
AST: 75 U/L — ABNORMAL HIGH (ref 0–37)
Albumin: 3.3 g/dL — ABNORMAL LOW (ref 3.5–5.2)
Alkaline Phosphatase: 57 U/L (ref 39–117)
Anion gap: 13 (ref 5–15)
BUN: 16 mg/dL (ref 6–23)
CO2: 22 mEq/L (ref 19–32)
Calcium: 8.2 mg/dL — ABNORMAL LOW (ref 8.4–10.5)
Chloride: 102 mEq/L (ref 96–112)
Creatinine, Ser: 0.81 mg/dL (ref 0.50–1.35)
GFR calc Af Amer: >90 mL/min (ref >90)
GFR calc non Af Amer: >90 mL/min (ref >90)
Glucose, Bld: 119 mg/dL — ABNORMAL HIGH (ref 70–99)
Potassium: 4.1 mEq/L (ref 3.7–5.3)
Sodium: 137 mEq/L (ref 137–147)
Total Bilirubin: 0.5 mg/dL (ref 0.3–1.2)
Total Protein: 5.6 g/dL — ABNORMAL LOW (ref 6.0–8.3)

## 2014-02-26 LAB — GLUCOSE, CAPILLARY
GLUCOSE-CAPILLARY: 134 mg/dL — AB (ref 70–99)
GLUCOSE-CAPILLARY: 122 mg/dL — AB (ref 70–99)
Glucose-Capillary: 140 mg/dL — ABNORMAL HIGH (ref 70–99)
Glucose-Capillary: 125 mg/dL — ABNORMAL HIGH (ref 70–99)
Glucose-Capillary: 127 mg/dL — ABNORMAL HIGH (ref 70–99)

## 2014-02-26 LAB — CBC
HCT: 24.4 % — ABNORMAL LOW (ref 39.0–52.0)
Hemoglobin: 8.1 g/dL — ABNORMAL LOW (ref 13.0–17.0)
MCH: 26.9 pg (ref 26.0–34.0)
MCHC: 33.2 g/dL (ref 30.0–36.0)
MCV: 81.1 fL (ref 78.0–100.0)
Platelets: 105 10*3/uL — ABNORMAL LOW (ref 150–400)
RBC: 3.01 MIL/uL — ABNORMAL LOW (ref 4.22–5.81)
RDW: 13.9 % (ref 11.5–15.5)
WBC: 19.1 10*3/uL — ABNORMAL HIGH (ref 4.0–10.5)

## 2014-02-26 LAB — PROTIME-INR
INR: 1.28 (ref 0.00–1.49)
Prothrombin Time: 16.1 seconds — ABNORMAL HIGH (ref 11.6–15.2)

## 2014-02-26 MED ORDER — TEMAZEPAM 15 MG PO CAPS
15.0000 mg | ORAL_CAPSULE | Freq: Every day | ORAL | Status: DC
  Administered 2014-02-26 – 2014-02-27 (×2): 15 mg via ORAL
  Filled 2014-02-26: qty 1
  Filled 2014-02-26: qty 2

## 2014-02-26 MED ORDER — METOCLOPRAMIDE HCL 5 MG/ML IJ SOLN
10.0000 mg | Freq: Four times a day (QID) | INTRAMUSCULAR | Status: DC
  Administered 2014-02-26 – 2014-02-28 (×10): 10 mg via INTRAVENOUS
  Filled 2014-02-26 (×14): qty 2

## 2014-02-26 MED ORDER — WARFARIN VIDEO: Freq: Once | Status: DC

## 2014-02-26 MED ORDER — WARFARIN SODIUM 5 MG PO TABS
5.0000 mg | ORAL_TABLET | Freq: Every day | ORAL | Status: DC
  Administered 2014-02-26 – 2014-02-27 (×2): 5 mg via ORAL
  Filled 2014-02-26 (×4): qty 1

## 2014-02-26 MED ORDER — WARFARIN - PHYSICIAN DOSING INPATIENT
Freq: Every day | Status: DC
  Administered 2014-02-27 – 2014-02-28 (×2)

## 2014-02-26 MED ORDER — FUROSEMIDE 10 MG/ML IJ SOLN
40.0000 mg | Freq: Two times a day (BID) | INTRAMUSCULAR | Status: DC
  Administered 2014-02-26: 30 mg via INTRAVENOUS
  Administered 2014-02-26 – 2014-02-27 (×2): 40 mg via INTRAVENOUS
  Filled 2014-02-26 (×3): qty 4

## 2014-02-26 MED ORDER — KETOROLAC TROMETHAMINE 15 MG/ML IJ SOLN
15.0000 mg | Freq: Four times a day (QID) | INTRAMUSCULAR | Status: AC
  Administered 2014-02-26 (×3): 15 mg via INTRAVENOUS
  Filled 2014-02-26 (×3): qty 1

## 2014-02-26 MED ORDER — COUMADIN BOOK
Freq: Once | Status: AC
  Administered 2014-02-26: 15:00:00
  Filled 2014-02-26: qty 1

## 2014-02-26 NOTE — Progress Notes (Signed)
Patient ID: Jeffrey [name], male   DOB: 02-Jun-1965, 48 y.o.   MRN: [id number]  SICU Evening Rounds:  Hemodynamically stable. Dopamine off. Still on milrinone 0.2  Urine output good  Up in chair.  Awake and alert

## 2014-02-27 ENCOUNTER — Inpatient Hospital Stay (HOSPITAL_COMMUNITY): Payer: BC Managed Care – PPO

## 2014-02-27 LAB — GLUCOSE, CAPILLARY
GLUCOSE-CAPILLARY: 105 mg/dL — AB (ref 70–99)
GLUCOSE-CAPILLARY: 155 mg/dL — AB (ref 70–99)
Glucose-Capillary: 162 mg/dL — ABNORMAL HIGH (ref 70–99)
Glucose-Capillary: 120 mg/dL — ABNORMAL HIGH (ref 70–99)
Glucose-Capillary: 119 mg/dL — ABNORMAL HIGH (ref 70–99)
Glucose-Capillary: 114 mg/dL — ABNORMAL HIGH (ref 70–99)
Glucose-Capillary: 127 mg/dL — ABNORMAL HIGH (ref 70–99)

## 2014-02-27 LAB — TYPE AND SCREEN
ABO/RH(D): O NEG
Antibody Screen: NEGATIVE
Unit division: 0
Unit division: 0
Unit division: 0
Unit division: 0

## 2014-02-27 LAB — CBC
HCT: 23.6 % — ABNORMAL LOW (ref 39.0–52.0)
Hemoglobin: 7.8 g/dL — ABNORMAL LOW (ref 13.0–17.0)
MCH: 26.4 pg (ref 26.0–34.0)
MCHC: 33.1 g/dL (ref 30.0–36.0)
MCV: 80 fL (ref 78.0–100.0)
Platelets: 103 10*3/uL — ABNORMAL LOW (ref 150–400)
RBC: 2.95 MIL/uL — ABNORMAL LOW (ref 4.22–5.81)
RDW: 13.8 % (ref 11.5–15.5)
WBC: 14.6 10*3/uL — ABNORMAL HIGH (ref 4.0–10.5)

## 2014-02-27 LAB — COMPREHENSIVE METABOLIC PANEL
ALT: 23 U/L (ref 0–53)
AST: 45 U/L — ABNORMAL HIGH (ref 0–37)
Albumin: 3 g/dL — ABNORMAL LOW (ref 3.5–5.2)
Alkaline Phosphatase: 83 U/L (ref 39–117)
Anion gap: 14 (ref 5–15)
BUN: 23 mg/dL (ref 6–23)
CO2: 21 mEq/L (ref 19–32)
Calcium: 8.3 mg/dL — ABNORMAL LOW (ref 8.4–10.5)
Chloride: 97 mEq/L (ref 96–112)
Creatinine, Ser: 0.92 mg/dL (ref 0.50–1.35)
GFR calc Af Amer: >90 mL/min (ref >90)
GFR calc non Af Amer: >90 mL/min (ref >90)
Glucose, Bld: 123 mg/dL — ABNORMAL HIGH (ref 70–99)
Potassium: 3.9 mEq/L (ref 3.7–5.3)
Sodium: 132 mEq/L — ABNORMAL LOW (ref 137–147)
Total Bilirubin: 0.5 mg/dL (ref 0.3–1.2)
Total Protein: 5.7 g/dL — ABNORMAL LOW (ref 6.0–8.3)

## 2014-02-27 LAB — PROTIME-INR
INR: 1.17 (ref 0.00–1.49)
Prothrombin Time: 15 seconds (ref 11.6–15.2)

## 2014-02-27 MED ORDER — SODIUM CHLORIDE 0.9 % IJ SOLN
3.0000 mL | INTRAMUSCULAR | Status: DC | PRN
Start: 2014-02-27 — End: 2014-02-28

## 2014-02-27 MED ORDER — MAGNESIUM HYDROXIDE 400 MG/5ML PO SUSP: 30.0000 mL | Freq: Every day | ORAL | Status: DC | PRN

## 2014-02-27 MED ORDER — SODIUM CHLORIDE 0.9 % IJ SOLN
3.0000 mL | Freq: Two times a day (BID) | INTRAMUSCULAR | Status: DC
  Administered 2014-02-27 – 2014-02-28 (×2): 3 mL via INTRAVENOUS

## 2014-02-27 MED ORDER — FE FUMARATE-B12-VIT C-FA-IFC PO CAPS
1.0000 | ORAL_CAPSULE | Freq: Three times a day (TID) | ORAL | Status: DC
  Administered 2014-02-27 – 2014-03-03 (×14): 1 via ORAL
  Filled 2014-02-27 (×16): qty 1

## 2014-02-27 MED ORDER — SODIUM CHLORIDE 0.9 % IV SOLN: 250.0000 mL | INTRAVENOUS | Status: DC | PRN

## 2014-02-27 MED ORDER — POTASSIUM CHLORIDE 10 MEQ/50ML IV SOLN
10.0000 meq | INTRAVENOUS | Status: AC
  Administered 2014-02-27 (×2): 10 meq via INTRAVENOUS
  Filled 2014-02-27 (×2): qty 50

## 2014-02-27 MED ORDER — METOLAZONE 5 MG PO TABS
5.0000 mg | ORAL_TABLET | Freq: Every day | ORAL | Status: DC
  Administered 2014-02-27: 5 mg via ORAL
  Filled 2014-02-27: qty 1

## 2014-02-27 MED ORDER — FUROSEMIDE 40 MG PO TABS
40.0000 mg | ORAL_TABLET | Freq: Every day | ORAL | Status: DC
  Administered 2014-02-28 – 2014-03-03 (×4): 40 mg via ORAL
  Filled 2014-02-27 (×5): qty 1

## 2014-02-27 MED ORDER — WARFARIN SODIUM 5 MG PO TABS: 5.0000 mg | ORAL_TABLET | Freq: Every day | ORAL | Status: DC

## 2014-02-27 MED ORDER — MOVING RIGHT ALONG BOOK
Freq: Once | Status: AC
  Administered 2014-02-27: 20:00:00
  Filled 2014-02-27: qty 1

## 2014-02-27 MED ORDER — FUROSEMIDE 10 MG/ML IJ SOLN
40.0000 mg | Freq: Two times a day (BID) | INTRAMUSCULAR | Status: AC
  Administered 2014-02-27: 40 mg via INTRAVENOUS

## 2014-02-27 NOTE — Discharge Instructions (Addendum)
Information on my medicine - Coumadin   (Warfarin)  This medication education was reviewed with me or my healthcare representative as part of my discharge preparation.  The pharmacist that spoke with me during my hospital stay was:  Dareen Piano, [facility]  Why was Coumadin prescribed for you? Coumadin was prescribed for you because you have a blood clot or a medical condition that can cause an increased risk of forming blood clots. Blood clots can cause serious health problems by blocking the flow of blood to the heart, lung, or brain. Coumadin can prevent harmful blood clots from forming. As a reminder your indication for Coumadin is:   Blood Clot Prevention After Heart Valve Surgery  What test will check on my response to Coumadin? While on Coumadin (warfarin) you will need to have an INR test regularly to ensure that your dose is keeping you in the desired range. The INR (international normalized ratio) number is calculated from the result of the laboratory test called prothrombin time (PT).  If an INR APPOINTMENT HAS NOT ALREADY BEEN MADE FOR YOU please schedule an appointment to have this lab work done by your health care provider within 7 days. Your INR goal is usually a number between:  2 to 3 or your provider may give you a more narrow range like 2-2.5.  Ask your health care provider during an office visit what your goal INR is.  What  do you need to  know  About  COUMADIN? Take Coumadin (warfarin) exactly as prescribed by your healthcare provider about the same time each day.  DO NOT stop taking without talking to the doctor who prescribed the medication.  Stopping without other blood clot prevention medication to take the place of Coumadin may increase your risk of developing a new clot or stroke.  Get refills before you run out.  What do you do if you miss a dose? If you miss a dose, take it as soon as you remember on the same day then continue your regularly scheduled regimen the  next day.  Do not take two doses of Coumadin at the same time.  Important Safety Information A possible side effect of Coumadin (Warfarin) is an increased risk of bleeding. You should call your healthcare provider right away if you experience any of the following: ? Bleeding from an injury or your nose that does not stop. ? Unusual colored urine (red or dark brown) or unusual colored stools (red or black). ? Unusual bruising for unknown reasons. ? A serious fall or if you hit your head (even if there is no bleeding).  Some foods or medicines interact with Coumadin (warfarin) and might alter your response to warfarin. To help avoid this: ? Eat a balanced diet, maintaining a consistent amount of Vitamin K. ? Notify your provider about major diet changes you plan to make. ? Avoid alcohol or limit your intake to 1 drink for women and 2 drinks for men per day. (1 drink is 5 oz. wine, 12 oz. beer, or 1.5 oz. liquor.)  Make sure that ANY health care provider who prescribes medication for you knows that you are taking Coumadin (warfarin).  Also make sure the healthcare provider who is monitoring your Coumadin knows when you have started a new medication including herbals and non-prescription products.  Coumadin (Warfarin)  Major Drug Interactions  Increased Warfarin Effect Decreased Warfarin Effect  Alcohol (large quantities) Antibiotics (esp. Septra/Bactrim, Flagyl, Cipro) Amiodarone (Cordarone) Aspirin (ASA) Cimetidine (Tagamet) Megestrol (Megace) NSAIDs (  ibuprofen, naproxen, etc.) Piroxicam (Feldene) Propafenone (Rythmol SR) Propranolol (Inderal) Isoniazid (INH) Posaconazole (Noxafil) Barbiturates (Phenobarbital) Carbamazepine (Tegretol) Chlordiazepoxide (Librium) Cholestyramine (Questran) Griseofulvin Oral Contraceptives Rifampin Sucralfate (Carafate) Vitamin K   Coumadin (Warfarin) Major Herbal Interactions  Increased Warfarin Effect Decreased Warfarin Effect   Garlic Ginseng Ginkgo biloba Coenzyme Q10 Green tea St. Johns wort    Coumadin (Warfarin) FOOD Interactions  Eat a consistent number of servings per week of foods HIGH in Vitamin K (1 serving =  cup)  Collards (cooked, or boiled & drained) Kale (cooked, or boiled & drained) Mustard greens (cooked, or boiled & drained) Parsley *serving size only =  cup Spinach (cooked, or boiled & drained) Swiss chard (cooked, or boiled & drained) Turnip greens (cooked, or boiled & drained)  Eat a consistent number of servings per week of foods MEDIUM-HIGH in Vitamin K (1 serving = 1 cup)  Asparagus (cooked, or boiled & drained) Broccoli (cooked, boiled & drained, or raw & chopped) Brussel sprouts (cooked, or boiled & drained) *serving size only =  cup Lettuce, raw (green leaf, endive, romaine) Spinach, raw Turnip greens, raw & chopped   These websites have more information on Coumadin (warfarin):  FailFactory.se; VeganReport.com.au;  Aortic Valve Replacement, Care After Refer to this sheet in the next few weeks. These instructions provide you with information on caring for yourself after your procedure. Your health care provider may also give you specific instructions. Your treatment has been planned according to current medical practices, but problems sometimes occur. Call your health care provider if you have any problems or questions after your procedure. HOME CARE INSTRUCTIONS   Take medicines only as directed by your health care provider.  If your health care provider has prescribed elastic stockings, wear them as directed.  Take frequent naps or rest often throughout the day.  Avoid lifting over 10 lbs (4.5 kg) or pushing or pulling things with your arms for 6-8 weeks or as directed by your health care provider.  Avoid driving or airplane travel for 4-6 weeks after surgery or as directed by your health care provider. If you are riding in a car for an extended  period, stop every 1-2 hours to stretch your legs. Keep a record of your medicines and medical history with you when traveling.  Do not drive or operate heavy machinery while taking pain medicine. (narcotics).  Do not cross your legs.  Do not use any tobacco products including cigarettes, chewing tobacco, or electronic cigarettes. If you need help quitting, ask your health care provider.  Do not take baths, swim, or use a hot tub until your health care provider approves. Take showers once your health care provider approves. Pat incisions dry. Do not rub incisions with a washcloth or towel.  Avoid climbing stairs and using the handrail to pull yourself up for the first 2-3 weeks after surgery.  Return to work as directed by your health care provider.  Drink enough fluid to keep your urine clear or pale yellow.  Do not strain to have a bowel movement. Eat high-fiber foods if you become constipated. You may also take a medicine to help you have a bowel movement (laxative) as directed by your health care provider.  Resume sexual activity as directed by your health care provider. Men should not use medicines for erectile dysfunction until their doctor says it isokay.  If you had a certain type of heart condition in the past, you may need to take antibiotic medicine before having dental work or  surgery. Let your dentist and health care providers know if you had one or more of the following:  Previous endocarditis.  An artificial (prosthetic) heart valve.  Congenital heart disease. SEEK MEDICAL CARE IF:  You develop a skin rash.   You experience sudden changes in your weight.  You have a fever. SEEK IMMEDIATE MEDICAL CARE IF:   You develop chest pain that is not coming from your incision.  You have drainage (pus), redness, swelling, or pain at your incision site.   You develop shortness of breath or have difficulty breathing.   You have increased bleeding from your incision  site.   You develop light-headedness.  MAKE SURE YOU:   Understand these directions.  Will watch your condition.  Will get help right away if you are not doing well or get worse. Document Released: 10/14/2004 Document Revised: 08/12/2013 Document Reviewed: 01/10/2012 [facility] Patient Information 2015 [place], [place]. This information is not intended to replace advice given to you by your health care provider. Make sure you discuss any questions you have with your health care provider.

## 2014-02-27 NOTE — Progress Notes (Signed)
3 Days Post-Op Procedure(s) (LRB): BENTALL PROCEDURE (N/A) INTRAOPERATIVE TRANSESOPHAGEAL ECHOCARDIOGRAM (N/A) Subjective: nsr BP stable Slept better Serous MT drainge  INR still hasnot bumped  Objective: Vital signs in last 24 hours: Temp:  [97.6 F (36.4 C)-98.4 F (36.9 C)] 98.4 F (36.9 C) (11/19 0757) Pulse Rate:  [80-107] 96 (11/19 0900) Cardiac Rhythm:  [-] Heart block (11/19 0900) Resp:  [14-32] 18 (11/19 0900) BP: (108-136)/(67-90) 136/90 mmHg (11/19 0900) SpO2:  [86 %-100 %] 99 % (11/19 0900) Weight:  [222 lb 3.6 oz (100.8 kg)] 222 lb 3.6 oz (100.8 kg) (11/19 0400)  Hemodynamic parameters for last 24 hours:  stable  Intake/Output from previous day: 11/18 0701 - 11/19 0700 In: 1814.7 [P.O.:870; I.V.:694.7; IV Piggyback:250] Out: 1400 [Urine:1260; Chest Tube:140] Intake/Output this shift: Total I/O In: 48.6 [I.V.:48.6] Out: 20 [Urine:20]  Lungs clear AVR with sharp closure click Mild edma  Lab Results:  Recent Labs  02/26/14 0400 02/27/14 0405  WBC 19.1* 14.6*  HGB 8.1* 7.8*  HCT 24.4* 23.6*  PLT 105* 103*   BMET:  Recent Labs  02/26/14 0400 02/27/14 0405  NA 137 132*  K 4.1 3.9  CL 102 97  CO2 22 21  GLUCOSE 119* 123*  BUN 16 23  CREATININE 0.81 0.92  CALCIUM 8.2* 8.3*    PT/INR:  Recent Labs  02/27/14 0405  LABPROT 15.0  INR 1.17   ABG    Component Value Date/Time   PHART 7.385 02/25/2014 0826   HCO3 22.1 02/25/2014 0826   TCO2 23 02/25/2014 1636   ACIDBASEDEF 2.0 02/25/2014 0826   O2SAT 65.2 02/25/2014 1646   CBG (last 3)   Recent Labs  02/26/14 1618 02/26/14 1924 02/26/14 2315  GLUCAP 127* 162* 120*    Assessment/Plan: S/P Procedure(s) (LRB): BENTALL PROCEDURE (N/A) INTRAOPERATIVE TRANSESOPHAGEAL ECHOCARDIOGRAM (N/A) Plan for transfer to step-down: see transfer orders Coumadin daily  LOS: 9 days    Jeffrey [name],Jeffrey [name] 02/27/2014

## 2014-02-27 NOTE — Plan of Care (Signed)
Problem: Phase II - Intermediate Post-Op Goal: Maintain Hemodynamic Stability Outcome: Completed/Met Date Met:  02/27/14 Goal: CBGs/Blood Glucose per SCIP Criteria Outcome: Completed/Met Date Met:  02/27/14 Goal: Pain controlled with appropriate interventions Outcome: Completed/Met Date Met:  02/27/14 Goal: Advance Diet Outcome: Completed/Met Date Met:  02/27/14 Goal: Activity Progressed Outcome: Completed/Met Date Met:  02/27/14 Goal: Patient advanced to Phase III: Barriers addressed Outcome: Completed/Met Date Met:  02/27/14

## 2014-02-28 ENCOUNTER — Inpatient Hospital Stay (HOSPITAL_COMMUNITY): Payer: BC Managed Care – PPO

## 2014-02-28 LAB — CBC
HCT: 25.3 % — ABNORMAL LOW (ref 39.0–52.0)
Hemoglobin: 8.7 g/dL — ABNORMAL LOW (ref 13.0–17.0)
MCH: 27.9 pg (ref 26.0–34.0)
MCHC: 34.4 g/dL (ref 30.0–36.0)
MCV: 81.1 fL (ref 78.0–100.0)
Platelets: 158 10*3/uL (ref 150–400)
RBC: 3.12 MIL/uL — ABNORMAL LOW (ref 4.22–5.81)
RDW: 13.7 % (ref 11.5–15.5)
WBC: 15.4 10*3/uL — ABNORMAL HIGH (ref 4.0–10.5)

## 2014-02-28 LAB — PROTIME-INR
INR: 1.3 (ref 0.00–1.49)
Prothrombin Time: 16.3 seconds — ABNORMAL HIGH (ref 11.6–15.2)

## 2014-02-28 LAB — BASIC METABOLIC PANEL
Anion gap: 17 — ABNORMAL HIGH (ref 5–15)
BUN: 24 mg/dL — ABNORMAL HIGH (ref 6–23)
CO2: 24 mEq/L (ref 19–32)
Calcium: 8.7 mg/dL (ref 8.4–10.5)
Chloride: 88 mEq/L — ABNORMAL LOW (ref 96–112)
Creatinine, Ser: 0.92 mg/dL (ref 0.50–1.35)
GFR calc Af Amer: >90 mL/min (ref >90)
GFR calc non Af Amer: >90 mL/min (ref >90)
Glucose, Bld: 111 mg/dL — ABNORMAL HIGH (ref 70–99)
Potassium: 3.6 mEq/L — ABNORMAL LOW (ref 3.7–5.3)
Sodium: 129 mEq/L — ABNORMAL LOW (ref 137–147)

## 2014-02-28 LAB — GLUCOSE, CAPILLARY
Glucose-Capillary: 117 mg/dL — ABNORMAL HIGH (ref 70–99)
Glucose-Capillary: 77 mg/dL (ref 70–99)
Glucose-Capillary: 120 mg/dL — ABNORMAL HIGH (ref 70–99)

## 2014-02-28 MED ORDER — ZOLPIDEM TARTRATE 5 MG PO TABS
5.0000 mg | ORAL_TABLET | Freq: Every evening | ORAL | Status: DC | PRN
  Administered 2014-02-28 – 2014-03-02 (×3): 5 mg via ORAL
  Filled 2014-02-28 (×3): qty 1

## 2014-02-28 MED ORDER — POTASSIUM CHLORIDE CRYS ER 20 MEQ PO TBCR
30.0000 meq | EXTENDED_RELEASE_TABLET | Freq: Once | ORAL | Status: AC
  Administered 2014-02-28: 30 meq via ORAL
  Filled 2014-02-28: qty 1

## 2014-02-28 MED ORDER — WARFARIN SODIUM 7.5 MG PO TABS
7.5000 mg | ORAL_TABLET | Freq: Every day | ORAL | Status: DC
  Administered 2014-02-28 – 2014-03-01 (×2): 7.5 mg via ORAL
  Filled 2014-02-28 (×3): qty 1

## 2014-02-28 NOTE — Progress Notes (Signed)
CARDIAC REHAB PHASE I   PRE:  Rate/Rhythm:     BP: sitting     SaO2:   MODE:  Ambulation: 500 ft   POST:  Rate/Rhythm: 109 ST    BP: sitting 130/90     SaO2: 100 RA  Pt up at door, wanting to walk. Unsteady on feet, ? Due to pain meds. Better with RW. No major c/o. HR somewhat elevated. Return to recliner, does not like bed. Put RW in room for pt to use later but gave instructions not to walk by himself. [id number]   Elissa Lovett Frankenmuth CES, ACSM 02/28/2014 1:33 PM

## 2014-02-28 NOTE — Progress Notes (Addendum)
      [address]       [name] [address]             [phone]        4 Days Post-Op Procedure(s) (LRB): BENTALL PROCEDURE (N/A) INTRAOPERATIVE TRANSESOPHAGEAL ECHOCARDIOGRAM (N/A)  Subjective: Patient states bed is uncomfortable and is having difficulty sleeping.  Objective: Vital signs in last 24 hours: Temp:  [97.6 F (36.4 C)-98.6 F (37 C)] 98.3 F (36.8 C) (11/20 0439) Pulse Rate:  [84-114] 87 (11/20 0439) Cardiac Rhythm:  [-] Normal sinus rhythm;Heart block (11/19 2030) Resp:  [15-34] 18 (11/20 0439) BP: (113-143)/(70-97) 124/86 mmHg (11/20 0439) SpO2:  [93 %-100 %] 97 % (11/20 0439) Weight:  [218 lb (98.884 kg)] 218 lb (98.884 kg) (11/20 0439)  Pre op weight 95.3 kg Current Weight  02/28/14 218 lb (98.884 kg)      Intake/Output from previous day: 11/19 0701 - 11/20 0700 In: 448.6 [P.O.:300; I.V.:48.6; IV Piggyback:100] Out: 2210 [Urine:2170; Chest Tube:40]   Physical Exam:  Cardiovascular: RRR, sharp valve click,no murmur Pulmonary: Clear to auscultation on right and very diminished on left; no rales, wheezes, or rhonchi. Abdomen: Soft, non tender, bowel sounds present. Extremities: Mild bilateral lower extremity edema. Wounds: Clean and dry.  No erythema or signs of infection.  Lab Results: CBC: Recent Labs  02/27/14 0405 02/28/14 0443  WBC 14.6* 15.4*  HGB 7.8* 8.7*  HCT 23.6* 25.3*  PLT 103* 158   BMET:  Recent Labs  02/27/14 0405 02/28/14 0443  NA 132* 129*  K 3.9 3.6*  CL 97 88*  CO2 21 24  GLUCOSE 123* 111*  BUN 23 24*  CREATININE 0.92 0.92  CALCIUM 8.3* 8.7    PT/INR:  Lab Results  Component Value Date   INR 1.30 02/28/2014   INR 1.17 02/27/2014   INR 1.28 02/26/2014   ABG:  INR: Will add last result for INR, ABG once components are confirmed Will add last 4 CBG results once components are confirmed  Assessment/Plan:  1. CV - HR 90 this am. On Lopressor 12.5 bid and Coumadin. INR increased  from 1.17 to 1.3. 2.  Pulmonary - CXR this am appears to show no pneumothorax, cardiomegaly and left retrocardiac opacity . On room air.Encourage incentive spirometer 3. Volume Overload - Continue Lasix 40 daily 4.  Acute blood loss anemia - H and H up to 8.7 and 25.3. Continue Trinsicon. 5. Supplement potassium 6. Thrombocytopenia resloved-platelets up to 158,000 7. Ambien PRN sleep 8. Remove EPW in am  ZIMMERMAN,DONIELLE MPA-C 02/28/2014,7:31 AM  patient examined and medical record reviewed,agree with above note. VAN TRIGT III,Linah Klapper 02/28/2014

## 2014-02-28 NOTE — Discharge Summary (Signed)
Physician Discharge Summary       [address]       [name] [address]             [phone]    Patient ID: Jeffrey [name] MRN: [id number] DOB/AGE: 05-06-1965 48 y.o.  Admit date: 02/18/2014 Discharge date: 03/03/2014  Admission Diagnoses: 1. Ascending thoracic aortic aneurysm 2. Severe aortic regurgitation 3. History of gout  Discharge Diagnoses:  1. Ascending thoracic aortic aneurysm 2. Severe aortic regurgitation 3. History of gout 4. ABL anemia 5. Thrombocytopenia (resolved prior to discharge)  Procedure (s):  1. Cardiac catheterization done by Dr. [name] on 02/20/2014: Coronary angiography: Coronary dominance: right  Left mainstem: No obstructive disease  Left anterior descending (LAD): no obstructive disease, patent to the LV apex  Left circumflex (LCx): large caliber vessel, no obstructive disease  Right coronary artery (RCA): dominant vessel with significant stenosis  Left ventriculography: Left ventricular systolic function is normal, LVEF is estimated at 55%, there is no significant mitral regurgitation   Aortic root angiography: Marked ascending aortic dilatation with severe AI  Final Conclusions:  1. Widely patent coronary arteries 2. Severely dilated aortic root with severe AI 3. Severely elevated intracardiac filling pressures  2.Bentall aortic root replacement using a mechanical valve-conduit and reimplantation of the coronary arteries (St. Jude 27 mm valve conduit serial #[id number]). 2. Replacement of the ascending aorta to the proximal arch with a 30 mm Dacron graft. 3. Placement of left femoral A-line for blood pressure monitoring by Dr. Donata Clay on 02/24/2014.  History of Presenting Illness: This is 48 year old male without significant past medical history was admitted to the hospital on 1110/2015 from his primary care physician with diagnosis of a cardiac murmur and recent onset of abdominal discomfort, bloating, and  cough of whitish phlegm. Patient denies chest pain fever, recent dental work, or prior knowledge of a heart murmur. An echocardiogram was initially performed demonstrating severe aortic insufficiency from a trileaflet valve, with annular dilatation being the primary mechanism of AI. The aorticroot was dilated with a fusiform aneurysm extending to the proximal arch vessels-maximum diameter 6.8 cm. There is no evidence of dissection or hematoma or an a penetrating ulcer.There is mild left ventricle dilatation and mild reduction in LV systolic function. No significant MR or TR.no pericardial effusion The patient underwent a CTA of the thoracic aorta which confirms the fusiform ascending aneurysm from the aortic root to the proximal arch.no evidence of dissection. CT scan demonstrates a soft appearing 1 cm right upper lobe nodule. Dr. Donata Clay was consulted for the consideration of a Bentall (replace ascending thoracic aorta and aortic valve, re implant coronary arteries). Potential risks, complications, and benefits were discussed with the patient and he agreed to proceed. Pre operative carotid duplex showed no significant carotid artery stenosis bilaterally.He underwent the Bentall on 02/24/2014.  Brief Hospital Course:  The patient was extubated the morning of post operative day one without difficulty. He remained afebrile and hemodynamically stable. He was weaned off of Dopamine and Milrinone drips. Theone Murdoch, a line, chest tubes, and foley were removed early in the post operative course. Lopressor was started and titrated accordingly. He was started on Coumadin (as with mechanical valve) and his PT and INR were monitored daily. His last INR was 1.57. He did have ABL anemia. His last H and H was 8.7 and 25.1 . He did not require a post op transfusion. He was started on Trinsicon. He also had thrombocytopenia. His last platelet count was  176,000.He was volume over loaded and diuresed. :Last chest x ray showed  cardiomegaly, persistent left pleural effusion and atelectasis, no pneumothorax, and right lung is clear. Per Dr. Donata ClayVan Trigt, he will be continued on Lasix for one week and does not require a thoracentesis at this time.He was weaned off the insulin drip. The patient's HGA1C pre op was 5.8. He is likely pre diabetic and will require further follow up with medical doctor after discharge. The patient was felt surgically stable for transfer from the ICU to [facility]U for further convalescence on 48/19/2014. He continues to progress with cardiac rehab. He was ambulating on room air. He  has been tolerating a diet and has had a bowel movement. Epicardial pacing wires and chest tube sutures will be removed prior to discharge. Per discussion with Dr. Donata ClayVan Trigt, surgically stable for discharge today.  Latest Vital Signs: Blood pressure 100/72, pulse 92, temperature 97.9 F (36.6 C), temperature source Oral, resp. rate 18, height 5\' 10"  (1.778 m), weight 217 lb 10.8 oz (98.736 kg), SpO2 100 %.  Physical Exam: Cardiovascular: RRR, sharp valve click,no murmur Pulmonary: Clear to auscultation on right and very diminished on left; no rales, wheezes, or rhonchi. Abdomen: Soft, non tender, bowel sounds present. Extremities: Mild bilateral lower extremity edema. Wounds: Clean and dry. No erythema or signs of infection.  Discharge Condition:Stable  Recent laboratory studies:  Lab Results  Component Value Date   WBC 10.3 03/01/2014   HGB 8.7* 03/01/2014   HCT 25.1* 03/01/2014   MCV 80.4 03/01/2014   PLT 176 03/01/2014   Lab Results  Component Value Date   NA 129* 02/28/2014   K 3.6* 02/28/2014   CL 88* 02/28/2014   CO2 24 02/28/2014   CREATININE 0.92 02/28/2014   GLUCOSE 111* 02/28/2014      Diagnostic Studies: Dg Orthopantogram  02/20/2014   CLINICAL DATA:  Pre-op for Bentall procedure. Aortic insufficiency. Initial encounter.  EXAM: ORTHOPANTOGRAM/PANORAMIC  COMPARISON:  None.  FINDINGS: There are  apparent incompletely erupted mandibular teeth bilaterally. No significant periodontal disease is identified. Midline artifact typical of the Panorex technique is noted.  IMPRESSION: No significant periodontal disease demonstrated.   Electronically Signed   By: Roxy HorsemanBill  Veazey M.D.   On: 02/20/2014 21:18   Dg Chest 2 View COMPARISON: Prior chest x-ray 02/28/2014  FINDINGS: Surgical changes consistent with prior median sternotomy and aortic valve replacement. Additionally, surgical staples are present in the right subclavian region.  Stable enlargement of the cardiopericardial silhouette. Persistent dense left retrocardiac opacity consistent with a layering left pleural effusion and associated atelectasis. No evidence pulmonary edema. The right lung remains clear. No pneumothorax. No acute osseous abnormality.  IMPRESSION: 1. Persistent layering left pleural effusion and associated left basilar atelectasis. 2. Stable enlargement of the cardiopericardial silhouette. 3. Surgical changes consistent with Bentall procedure.   Electronically Signed  By: Malachy MoanHeath McCullough M.D.  On: 03/03/2014 07:51   Ct Angio Chest Aortic Dissect W &/or W/o  02/19/2014   CLINICAL DATA:  Cough and shortness of breath, history of aortic aneurysm  EXAM: CT ANGIOGRAPHY CHEST WITH CONTRAST  TECHNIQUE: Multidetector CT imaging of the chest was performed using the standard protocol during bolus administration of intravenous contrast. Multiplanar CT image reconstructions and MIPs were obtained to evaluate the vascular anatomy.  CONTRAST:  100mL OMNIPAQUE IOHEXOL 350 MG/ML SOLN  COMPARISON:  Plain film from earlier in the same day.  FINDINGS: The lungs are well aerated but demonstrate mild emphysematous changes bilaterally. Somewhat linear appearing nodular  density is min  Noted in the right upper lobe measuring 10 mm in greatest dimension. It has a sub solid component. This is best seen on the coronal imaging image 66 of  series 8. No other parenchymal abnormalities are noted. Small right-sided pleural effusion is seen.  The hilar and mediastinal structures show aneurysmal dilatation of the ascending aorta. The aneurysm arises at the level aortic root. Although not gated for cardiac motion measurement at the sino-tubular junction is approximately 5.4 x 5.5 cm in greatest transverse and AP dimensions. It measures approximately 6.8 x 7.1 cm in greatest AP and transverse dimensions in the ascending aorta at the level of the main pulmonary artery. It does not appear to significantly involves the arch and the descending aorta tapers in a normal fashion. No dissection is identified the left vertebral artery arises directly from the aortic arch. The origins of the brachiocephalic vessels are within normal limits. No significant coronary calcifications are noted. No right heart strain is seen. Although not timed for pulmonary artery evaluation no definitive pulmonary embolism is seen  The visualized upper abdomen is within normal limits. Some variant anatomy is noted with the left gastric artery arising directly from the aorta. The osseous structures show no acute abnormality.  Review of the MIP images confirms the above findings.  IMPRESSION: No evidence of pulmonary emboli. A small right pleural effusion is noted.  Ascending aortic aneurysm as described above.  Nodular changes within the right upper lobe. If the patient is at high risk for bronchogenic carcinoma, follow-up chest CT at 3-59months is recommended. If the patient is at low risk for bronchogenic carcinoma, follow-up chest CT at 6-12 months is recommended. This recommendation follows the consensus statement: Guidelines for Management of Small Pulmonary Nodules Detected on CT Scans: A Statement from the Fleischner Society as published in Radiology 2005; 237:395-400.  Variant anatomy of the left vertebral artery and left gastric artery.   Electronically Signed   By: Alcide Clever  M.D.   On: 02/19/2014 17:04   Discharge Instructions    Amb Referral to Cardiac Rehabilitation    Complete by:  As directed           Discharge Medications:   Medication List    STOP taking these medications        guaiFENesin-codeine 100-10 MG/5ML syrup     ibuprofen 200 MG tablet  Commonly known as:  ADVIL,MOTRIN      TAKE these medications        aspirin 81 MG EC tablet  Take 1 tablet (81 mg total) by mouth daily.     ferrous fumarate-b12-vitamic C-folic acid capsule  Commonly known as:  TRINSICON / FOLTRIN  Take 1 capsule by mouth daily. For one month then stop.     furosemide 40 MG tablet  Commonly known as:  LASIX  Take 1 tablet (40 mg total) by mouth daily. For one week.     ipratropium 0.06 % nasal spray  Commonly known as:  ATROVENT  Place 2 sprays into both nostrils 4 (four) times daily.     metoprolol tartrate 25 MG tablet  Commonly known as:  LOPRESSOR  Take 0.5 tablets (12.5 mg total) by mouth 2 (two) times daily.     oxyCODONE 5 MG immediate release tablet  Commonly known as:  Oxy IR/ROXICODONE  Take 1-2 tablets (5-10 mg total) by mouth every 4 (four) hours as needed for severe pain.     Potassium Chloride ER 20 MEQ Tbcr  Take 20 mEq by mouth daily. For one week then stop.     warfarin 7.5 MG tablet  Commonly known as:  COUMADIN  Take 1 tablet (7.5 mg total) by mouth daily.        The patient has been discharged on:   1.Beta Blocker:  Yes [ x  ]                              No   [   ]                              If No, reason:  2.Ace Inhibitor/ARB: Yes [   ]                                     No  [   x ]                                     If No, reason:Preseverd LVEF, no CAD  3.Statin:   Yes [   ]                  No  [  x ]                  If No, reason:No CAD, hyperlipidemia  4.Marlowe KaysCarlyle Basques   ]                  No   [   ]                  If No, reason:  Follow Up Appointments: Follow-up Information    Follow up with  Tereso Newcomer, PA-C On 03/24/2014.   Specialty:  Physician Assistant   Why:  Appointment time is at 11:10 am   Contact information:   [address] [place] [address] [phone]       Follow up with Mikey Bussing, MD On 04/02/2014.   Specialty:  Cardiothoracic Surgery   Why:  PA/LAT CXR (to be taken at [facility] which is in the same building as Dr. Zenaida Niece Trigt's office) on 04/02/2014 at 11:30 am;Appointment time is at 12:30 pm   Contact information:   [address] [employer] [address] [place] [address] [phone]       Follow up with Beverley Fiedler, MD.   Specialty:  Family Medicine   Why:  Please call for a follow up appointment regarding further surveillance of HGA1C 5.8   Contact information:   [address] [place] [address]       Follow up with VAN Dinah Beers, MD.   Specialty:  Cardiothoracic Surgery   Why:  Please come to have the PT and INR drawn on Wednesday 03/05/2014. The lab office is on the first floor in the same building as Dr. Zenaida Niece Trigt's office. Please call or fax results to Dr. Donata Clay   Contact information:   [address] [place] [address] [phone]       Follow up with Mikey Bussing, MD On 03/10/2014.   Specialty:  Cardiothoracic Surgery   Why:  Remaining staples are to be  removed at 10:30 am. Appointment is with nurse only, not surgeon.   Contact information:   [address] [employer] [address] [place] [address] [phone]       Signed: Doree Fudge MPA-C 03/03/2014, 3:19 PM

## 2014-02-28 NOTE — Progress Notes (Addendum)
Pt ambulated in hallway using walker with wife. No complaints from pt. Steady gait with walker.  Encouraged continued ambulation with walker and assistance.  Verbalized understanding. Will continue to monitor pt closely.

## 2014-03-01 LAB — CBC
HCT: 25.1 % — ABNORMAL LOW (ref 39.0–52.0)
Hemoglobin: 8.7 g/dL — ABNORMAL LOW (ref 13.0–17.0)
MCH: 27.9 pg (ref 26.0–34.0)
MCHC: 34.7 g/dL (ref 30.0–36.0)
MCV: 80.4 fL (ref 78.0–100.0)
Platelets: 176 10*3/uL (ref 150–400)
RBC: 3.12 MIL/uL — ABNORMAL LOW (ref 4.22–5.81)
RDW: 13.5 % (ref 11.5–15.5)
WBC: 10.3 10*3/uL (ref 4.0–10.5)

## 2014-03-01 LAB — PROTIME-INR
INR: 1.32 (ref 0.00–1.49)
Prothrombin Time: 16.6 seconds — ABNORMAL HIGH (ref 11.6–15.2)

## 2014-03-01 LAB — GLUCOSE, CAPILLARY
GLUCOSE-CAPILLARY: 169 mg/dL — AB (ref 70–99)
Glucose-Capillary: 121 mg/dL — ABNORMAL HIGH (ref 70–99)

## 2014-03-01 MED ORDER — GUAIFENESIN-DM 100-10 MG/5ML PO SYRP
5.0000 mL | ORAL_SOLUTION | ORAL | Status: DC | PRN
  Administered 2014-03-01: 5 mL via ORAL

## 2014-03-01 MED ORDER — INFLUENZA VAC SPLIT QUAD 0.5 ML IM SUSY
0.5000 mL | PREFILLED_SYRINGE | INTRAMUSCULAR | Status: DC
  Filled 2014-03-01: qty 0.5

## 2014-03-01 NOTE — Plan of Care (Signed)
Problem: Phase III - Recovery through Discharge Goal: Maintain Hemodynamic Stability Outcome: Completed/Met Date Met:  03/01/14

## 2014-03-01 NOTE — Progress Notes (Addendum)
[address]       [place] [id number]             [phone]      5 Days Post-Op Procedure(s) (LRB): BENTALL PROCEDURE (N/A) INTRAOPERATIVE TRANSESOPHAGEAL ECHOCARDIOGRAM (N/A) Subjective: Looks and feels well  Objective: Vital signs in last 24 hours: Temp:  [98.6 F (37 C)-99.2 F (37.3 C)] 98.6 F (37 C) (11/21 91470637) Pulse Rate:  [80-85] 80 (11/21 0637) Cardiac Rhythm:  [-] Normal sinus rhythm;Heart block (11/20 2040) Resp:  [18-20] 20 (11/21 0637) BP: (115-127)/(75-81) 127/77 mmHg (11/21 0637) SpO2:  [98 %-100 %] 99 % (11/21 0637) Weight:  [215 lb 9.6 oz (97.796 kg)] 215 lb 9.6 oz (97.796 kg) (11/21 [id number])  Hemodynamic parameters for last 24 hours:    Intake/Output from previous day: 11/20 0701 - 11/21 0700 In: 600 [P.O.:600] Out: 2400 [Urine:2400] Intake/Output this shift:    General appearance: alert, cooperative and no distress Heart: regular rate and rhythm and crisp valve click Lungs: clear to auscultation bilaterally Abdomen: benign Extremities: no edema Wound: incis healing well  Lab Results:  Recent Labs  02/28/14 0443 03/01/14 0459  WBC 15.4* 10.3  HGB 8.7* 8.7*  HCT 25.3* 25.1*  PLT 158 176   BMET:  Recent Labs  02/27/14 0405 02/28/14 0443  NA 132* 129*  K 3.9 3.6*  CL 97 88*  CO2 21 24  GLUCOSE 123* 111*  BUN 23 24*  CREATININE 0.92 0.92  CALCIUM 8.3* 8.7    PT/INR:  Recent Labs  03/01/14 0459  LABPROT 16.6*  INR 1.32   ABG    Component Value Date/Time   PHART 7.385 02/25/2014 0826   HCO3 22.1 02/25/2014 0826   TCO2 23 02/25/2014 1636   ACIDBASEDEF 2.0 02/25/2014 0826   O2SAT 65.2 02/25/2014 1646   CBG (last 3)   Recent Labs  02/28/14 0545 02/28/14 1734 02/28/14 2130  GLUCAP 117* 77 120*    Meds Scheduled Meds: . acetaminophen  1,000 mg Oral 4 times per day   Or  . acetaminophen (TYLENOL) oral liquid 160 mg/5 mL  1,000 mg Per Tube 4 times per day  . aspirin EC  325 mg Oral Daily    . bisacodyl  10 mg Oral Daily   Or  . bisacodyl  10 mg Rectal Daily  . docusate sodium  200 mg Oral Daily  . ferrous fumarate-b12-vitamic C-folic acid  1 capsule Oral TID PC  . furosemide  40 mg Oral Daily  . metoprolol tartrate  12.5 mg Oral BID   Or  . metoprolol tartrate  12.5 mg Per Tube BID  . mupirocin ointment  1 application Nasal BID  . pantoprazole  40 mg Oral Daily  . sodium chloride  3 mL Intravenous Q12H  . warfarin  7.5 mg Oral Daily  . warfarin   Does not apply Once  . Warfarin - Physician Dosing Inpatient   Does not apply q1800   Continuous Infusions:  PRN Meds:.magnesium hydroxide, metoprolol, ondansetron (ZOFRAN) IV, oxyCODONE, sodium chloride, traMADol, zolpidem  Xrays Dg Chest 2 View  02/28/2014   CLINICAL DATA:  Post aortic valve replacement  EXAM: CHEST  2 VIEW  COMPARISON:  02/27/2014.  FINDINGS: Cardiomegaly again noted. Status post median sternotomy and aortic valve replacement. Postsurgical changes are noted right upper chest wall small left pleural effusion with left basilar atelectasis or infiltrate. No pulmonary edema. No pneumothorax. Right IJ sheath has been removed.  IMPRESSION: Cardiomegaly. Status  post median sternotomy and cardiac valve replacement. Small left pleural effusion with basilar atelectasis or infiltrate. No pulmonary edema.   Electronically Signed   By: Natasha Mead M.D.   On: 02/28/2014 08:04    Assessment/Plan: S/P Procedure(s) (LRB): BENTALL PROCEDURE (N/A) INTRAOPERATIVE TRANSESOPHAGEAL ECHOCARDIOGRAM (N/A)  1 doing very well 2 INR slowly rising, cont 7.5 mg coumadin for today 3 H/H stable 4 platelets cont to improve 5 appears euvolemic, cont gentle diuretic for small effusion 6 d/c epw's today 7 poss home in am  LOS: 11 days    GOLD,WAYNE E 03/01/2014  Agree with above note We'll need to wait for the INR to bump before sending patient home Surgical incisions healing well  patient examined and medical record  reviewed,agree with above note. VAN TRIGT III,Jeffrey [name] 03/01/2014

## 2014-03-01 NOTE — Progress Notes (Signed)
Dc'ed pacing wires pt. tolarated well

## 2014-03-01 NOTE — Progress Notes (Signed)
CARDIAC REHAB PHASE I   PRE:  Rate/Rhythm: 92 pacing  BP:  Sitting: 120/80     SaO2: 100 RA  MODE:  Ambulation: 550 ft   POST:  Rate/Rhythm: 97 pacing  BP:  Sitting: 134/80  Pt walked 350 ft with RW and no assist.  Pt had steady gait and no c/o during ambulation.  Completed education with pt. Pt very responsive to education with excellent teach back.  Encouraged walking again this afternoon with nurse.  Pt interested in CRP II in [place].  [id number]  Marvene Staff MS, ACSM RCEP 11:53 AM 03/01/2014

## 2014-03-02 LAB — GLUCOSE, CAPILLARY
GLUCOSE-CAPILLARY: 112 mg/dL — AB (ref 70–99)
GLUCOSE-CAPILLARY: 193 mg/dL — AB (ref 70–99)

## 2014-03-02 LAB — PROTIME-INR
INR: 1.4 (ref 0.00–1.49)
Prothrombin Time: 17.3 seconds — ABNORMAL HIGH (ref 11.6–15.2)

## 2014-03-02 MED ORDER — WARFARIN SODIUM 10 MG PO TABS
10.0000 mg | ORAL_TABLET | Freq: Every day | ORAL | Status: DC
  Administered 2014-03-02: 10 mg via ORAL
  Filled 2014-03-02 (×2): qty 1

## 2014-03-02 NOTE — Progress Notes (Signed)
      [name]       [name] [address]             [phone]      6 Days Post-Op Procedure(s) (LRB): BENTALL PROCEDURE (N/A) INTRAOPERATIVE TRANSESOPHAGEAL ECHOCARDIOGRAM (N/A) Subjective: Feels ok, no new complaints. Pain increased with cough.  Objective: Vital signs in last 24 hours: Temp:  [97.6 F (36.4 C)-98.6 F (37 C)] 97.6 F (36.4 C) (11/22 0357) Pulse Rate:  [76-81] 81 (11/22 0357) Cardiac Rhythm:  [-] Normal sinus rhythm;Heart block (11/21 2000) Resp:  [19-20] 19 (11/22 0357) BP: (104-125)/(66-82) 125/82 mmHg (11/22 0357) SpO2:  [98 %-100 %] 100 % (11/22 0357) Weight:  [217 lb 10.8 oz (98.736 kg)] 217 lb 10.8 oz (98.736 kg) (11/22 0357)  Hemodynamic parameters for last 24 hours:    Intake/Output from previous day: 11/21 0701 - 11/22 0700 In: 360 [P.O.:360] Out: 3075 [Urine:3075] Intake/Output this shift:    General appearance: alert, cooperative and no distress Heart: regular rate and rhythm and crisp valve click Lungs: dim in left lowwer fields Abdomen: benign Extremities: no edema Wound: incis healing well  Lab Results:  Recent Labs  02/28/14 0443 03/01/14 0459  WBC 15.4* 10.3  HGB 8.7* 8.7*  HCT 25.3* 25.1*  PLT 158 176   BMET:  Recent Labs  02/28/14 0443  NA 129*  K 3.6*  CL 88*  CO2 24  GLUCOSE 111*  BUN 24*  CREATININE 0.92  CALCIUM 8.7    PT/INR:  Recent Labs  03/02/14 0355  LABPROT 17.3*  INR 1.40   ABG    Component Value Date/Time   PHART 7.385 02/25/2014 0826   HCO3 22.1 02/25/2014 0826   TCO2 23 02/25/2014 1636   ACIDBASEDEF 2.0 02/25/2014 0826   O2SAT 65.2 02/25/2014 1646   CBG (last 3)   Recent Labs  02/28/14 2130 03/01/14 1153 03/01/14 1636  GLUCAP 120* 121* 169*    Meds Scheduled Meds: . aspirin EC  325 mg Oral Daily  . bisacodyl  10 mg Oral Daily   Or  . bisacodyl  10 mg Rectal Daily  . docusate sodium  200 mg Oral Daily  . ferrous fumarate-b12-vitamic C-folic acid   1 capsule Oral TID PC  . furosemide  40 mg Oral Daily  . Influenza vac split quadrivalent PF  0.5 mL Intramuscular Tomorrow-1000  . metoprolol tartrate  12.5 mg Oral BID   Or  . metoprolol tartrate  12.5 mg Per Tube BID  . pantoprazole  40 mg Oral Daily  . sodium chloride  3 mL Intravenous Q12H  . warfarin  7.5 mg Oral Daily  . warfarin   Does not apply Once  . Warfarin - Physician Dosing Inpatient   Does not apply q1800   Continuous Infusions:  PRN Meds:.guaiFENesin-dextromethorphan, magnesium hydroxide, metoprolol, ondansetron (ZOFRAN) IV, oxyCODONE, sodium chloride, traMADol, zolpidem  Xrays No results found.  Assessment/Plan: S/P Procedure(s) (LRB): BENTALL PROCEDURE (N/A) INTRAOPERATIVE TRANSESOPHAGEAL ECHOCARDIOGRAM (N/A)  1 INR conts to slowly rise, will increase coumadin to 10 mg 2 exam shows dim moreso in left lung base- will re-evaluate pleural effusion to see if poss needs tap 3 conts current rx     LOS: 12 days    Jeffrey [name] 03/02/2014

## 2014-03-02 NOTE — Plan of Care (Signed)
Problem: Phase III - Recovery through Discharge Goal: Activity Progressed Outcome: Completed/Met Date Met:  03/02/14

## 2014-03-03 ENCOUNTER — Other Ambulatory Visit: Payer: Self-pay

## 2014-03-03 ENCOUNTER — Inpatient Hospital Stay (HOSPITAL_COMMUNITY): Payer: BC Managed Care – PPO

## 2014-03-03 DIAGNOSIS — I351 Nonrheumatic aortic (valve) insufficiency: Secondary | ICD-10-CM | POA: Insufficient documentation

## 2014-03-03 LAB — PROTIME-INR
INR: 1.57 — ABNORMAL HIGH (ref 0.00–1.49)
Prothrombin Time: 18.9 seconds — ABNORMAL HIGH (ref 11.6–15.2)

## 2014-03-03 MED ORDER — FE FUMARATE-B12-VIT C-FA-IFC PO CAPS: 1.0000 | ORAL_CAPSULE | Freq: Every day | ORAL | Status: DC

## 2014-03-03 MED ORDER — METOPROLOL TARTRATE 25 MG PO TABS: 12.5000 mg | ORAL_TABLET | Freq: Two times a day (BID) | ORAL | Status: DC

## 2014-03-03 MED ORDER — FUROSEMIDE 40 MG PO TABS: 40.0000 mg | ORAL_TABLET | Freq: Every day | ORAL | Status: DC

## 2014-03-03 MED ORDER — OXYCODONE HCL 5 MG PO TABS: 5.0000 mg | ORAL_TABLET | ORAL | Status: DC | PRN

## 2014-03-03 MED ORDER — METOPROLOL TARTRATE 25 MG PO TABS
25.0000 mg | ORAL_TABLET | Freq: Two times a day (BID) | ORAL | Status: DC
  Administered 2014-03-03: 25 mg via ORAL
  Filled 2014-03-03 (×2): qty 1

## 2014-03-03 MED ORDER — FE FUMARATE-B12-VIT C-FA-IFC PO CAPS: 1.0000 | ORAL_CAPSULE | Freq: Three times a day (TID) | ORAL | Status: DC

## 2014-03-03 MED ORDER — WARFARIN SODIUM 7.5 MG PO TABS: 7.5000 mg | ORAL_TABLET | Freq: Every day | ORAL | Status: DC

## 2014-03-03 MED ORDER — POTASSIUM CHLORIDE ER 20 MEQ PO TBCR: 20.0000 meq | EXTENDED_RELEASE_TABLET | Freq: Every day | ORAL | Status: DC

## 2014-03-03 MED ORDER — ASPIRIN 325 MG PO TBEC: 325.0000 mg | DELAYED_RELEASE_TABLET | Freq: Every day | ORAL | Status: DC

## 2014-03-03 MED ORDER — ASPIRIN 81 MG PO TBEC: 81.0000 mg | DELAYED_RELEASE_TABLET | Freq: Every day | ORAL | Status: DC

## 2014-03-03 NOTE — Progress Notes (Signed)
CARDIAC REHAB PHASE I   PRE:  Rate/Rhythm: 89 SR  BP:  Supine:   Sitting: 120/66  Standing:    SaO2: 98 RS  MODE:  Ambulation: 890 ft   POST:  Rate/Rhythm: 97  BP:  Supine:   Sitting: 130/72  Standing:    SaO2: 100 RA 0955-1020 Pt tolerated ambulation well without c/o. Pt has steady gait without walker. VS stable Pt back to recliner after walk with call light in reach and wife present.  Melina Copa RN 03/03/2014 10:20 AM

## 2014-03-03 NOTE — Progress Notes (Addendum)
      [address]       [place], [address]             [phone]        7 Days Post-Op Procedure(s) (LRB): BENTALL PROCEDURE (N/A) INTRAOPERATIVE TRANSESOPHAGEAL ECHOCARDIOGRAM (N/A)  Subjective: Patient feeling ok. Was wondering when he can go home.  Objective: Vital signs in last 24 hours: Temp:  [97.4 F (36.3 C)-98.7 F (37.1 C)] 97.4 F (36.3 C) (11/23 0507) Pulse Rate:  [56-88] 86 (11/23 0507) Cardiac Rhythm:  [-] Normal sinus rhythm;Heart block (11/22 2000) Resp:  [17-18] 18 (11/23 0507) BP: (112-124)/(57-82) 124/57 mmHg (11/23 0507) SpO2:  [97 %-100 %] 97 % (11/23 0507)  Pre op weight 95.3 kg Current Weight  03/02/14 217 lb 10.8 oz (98.736 kg)      Intake/Output from previous day: 11/22 0701 - 11/23 0700 In: -  Out: 2225 [Urine:2225]   Physical Exam:  Cardiovascular: RRR, sharp valve click,no murmur Pulmonary: Clear to auscultation on right and very diminished on left; no rales, wheezes, or rhonchi. Abdomen: Soft, non tender, bowel sounds present. Extremities: Trace lower extremity edema. Wounds: Clean and dry.  No erythema or signs of infection.  Lab Results: CBC:  Recent Labs  03/01/14 0459  WBC 10.3  HGB 8.7*  HCT 25.1*  PLT 176   BMET: No results for input(s): NA, K, CL, CO2, GLUCOSE, BUN, CREATININE, CALCIUM in the last 72 hours.  PT/INR:  Lab Results  Component Value Date   INR 1.57* 03/03/2014   INR 1.40 03/02/2014   INR 1.32 03/01/2014   ABG:  INR: Will add last result for INR, ABG once components are confirmed Will add last 4 CBG results once components are confirmed  Assessment/Plan:  1. CV - PVCs/HR in the low 100's this am. On Lopressor 12.5 bid and Coumadin. Will increase Lopressor to 25 bid. INR increased from 1.40 to 1.57. 2.  Pulmonary -  CXR appears to show no pneumothorax, cardiomegaly, small left pleural effusion with atelectasis.Will discuss with Dr. Donata Clay if needs thoracentesis vs  continuing Lasix. Per Dr. Donata Clay, continue Lasix. On room air.Encourage incentive spirometer 3. Volume Overload - Continue Lasix 40 daily 4.  Acute blood loss anemia - H and H up to 8.7 and 25.1. Continue Trinsicon. 5. Will remove every other staple 6. Remove sutures 7. Per Dr. Donata Clay, may discharge home. Coumadin 7.5 mg daily and patient is to have PT and INR drawn at our office. Also, BP decreased with increase in Lopressor so will discharge on Lopressor 12.5 bid.  Trinette Vera MPA-C 03/03/2014,7:28 AM

## 2014-03-03 NOTE — Progress Notes (Signed)
Patient is discharged from room 2W06 at this time. Alert and in stable condition. IV site d/c'd as well as tele. Instructions read to patient and sister and understanding verbalized. Left unit via wheelchair with family and belongings.

## 2014-03-03 NOTE — Progress Notes (Signed)
Nurse notified by CMT of patient's heart rate in 140's and going into A-Fib. 12 lead EKG done. See results.

## 2014-03-05 ENCOUNTER — Encounter: Payer: Self-pay | Admitting: Cardiothoracic Surgery

## 2014-03-05 ENCOUNTER — Other Ambulatory Visit: Payer: Self-pay

## 2014-03-05 DIAGNOSIS — I351 Nonrheumatic aortic (valve) insufficiency: Secondary | ICD-10-CM

## 2014-03-05 DIAGNOSIS — I719 Aortic aneurysm of unspecified site, without rupture: Secondary | ICD-10-CM

## 2014-03-05 DIAGNOSIS — Z952 Presence of prosthetic heart valve: Secondary | ICD-10-CM

## 2014-03-05 LAB — PROTIME-INR
INR: 1.77 — ABNORMAL HIGH (ref <1.50)
Prothrombin Time: 20.7 seconds — ABNORMAL HIGH (ref 11.6–15.2)

## 2014-03-10 ENCOUNTER — Encounter: Payer: Self-pay | Admitting: Cardiothoracic Surgery

## 2014-03-10 ENCOUNTER — Encounter (INDEPENDENT_AMBULATORY_CARE_PROVIDER_SITE_OTHER): Payer: Self-pay

## 2014-03-10 ENCOUNTER — Encounter: Payer: Self-pay | Admitting: Physician Assistant

## 2014-03-10 DIAGNOSIS — I351 Nonrheumatic aortic (valve) insufficiency: Secondary | ICD-10-CM

## 2014-03-10 LAB — PROTIME-INR
INR: 1.99 — ABNORMAL HIGH (ref <1.50)
Prothrombin Time: 22.8 seconds — ABNORMAL HIGH (ref 11.6–15.2)

## 2014-03-11 ENCOUNTER — Encounter: Payer: Self-pay | Admitting: Cardiothoracic Surgery

## 2014-03-11 ENCOUNTER — Encounter: Payer: Self-pay | Admitting: *Deleted

## 2014-03-12 ENCOUNTER — Encounter: Payer: Self-pay | Admitting: Cardiothoracic Surgery

## 2014-03-12 ENCOUNTER — Encounter: Payer: Self-pay | Admitting: *Deleted

## 2014-03-13 ENCOUNTER — Encounter: Payer: Self-pay | Admitting: Cardiothoracic Surgery

## 2014-03-13 ENCOUNTER — Telehealth: Payer: Self-pay | Admitting: Cardiovascular Disease

## 2014-03-13 NOTE — Telephone Encounter (Signed)
Attempted to call patient. Voicemail is not set up °

## 2014-03-13 NOTE — Telephone Encounter (Signed)
New problem   Pt need to speak to nurse concerning changing his medication dosage. Pt stated he sent a message thru patient email.Marland KitchenPlease call pt.

## 2014-03-13 NOTE — Telephone Encounter (Signed)
Patient is taking Metoprolol 12.5mg  bid. BP running 110-130 diastolic to 78-84 diastolic. Pulse 93-102. Wondering about changing this med to take once a day in the am. Explained to him that he is not on the extended release form of the med, so taking it in am only would not be therapeutic. Patient voiced understanding. He is also worried about bruising, turning green at surg site. Advised him this is normal, as long as there is no redness or swelling. Will keep his appointment with Wende Mott PA on 12/14.

## 2014-03-17 ENCOUNTER — Ambulatory Visit (INDEPENDENT_AMBULATORY_CARE_PROVIDER_SITE_OTHER): Payer: BC Managed Care – PPO | Admitting: *Deleted

## 2014-03-17 ENCOUNTER — Encounter: Payer: Self-pay | Admitting: Cardiothoracic Surgery

## 2014-03-17 DIAGNOSIS — Z5181 Encounter for therapeutic drug level monitoring: Secondary | ICD-10-CM

## 2014-03-17 DIAGNOSIS — I719 Aortic aneurysm of unspecified site, without rupture: Secondary | ICD-10-CM

## 2014-03-17 LAB — POCT INR: INR: 2.4

## 2014-03-17 NOTE — Patient Instructions (Signed)

## 2014-03-18 ENCOUNTER — Encounter: Payer: Self-pay | Admitting: Cardiothoracic Surgery

## 2014-03-19 ENCOUNTER — Telehealth: Payer: Self-pay | Admitting: Physician Assistant

## 2014-03-19 NOTE — Telephone Encounter (Signed)
Error/disregard

## 2014-03-20 ENCOUNTER — Encounter (HOSPITAL_COMMUNITY): Payer: Self-pay | Admitting: Cardiovascular Disease

## 2014-03-20 ENCOUNTER — Encounter: Payer: Self-pay | Admitting: *Deleted

## 2014-03-24 ENCOUNTER — Telehealth: Payer: Self-pay | Admitting: *Deleted

## 2014-03-24 ENCOUNTER — Encounter: Payer: Self-pay | Admitting: Physician Assistant

## 2014-03-24 ENCOUNTER — Ambulatory Visit (INDEPENDENT_AMBULATORY_CARE_PROVIDER_SITE_OTHER): Payer: BC Managed Care – PPO | Admitting: Surgery

## 2014-03-24 ENCOUNTER — Ambulatory Visit (INDEPENDENT_AMBULATORY_CARE_PROVIDER_SITE_OTHER): Payer: BC Managed Care – PPO | Admitting: Physician Assistant

## 2014-03-24 ENCOUNTER — Ambulatory Visit (INDEPENDENT_AMBULATORY_CARE_PROVIDER_SITE_OTHER): Payer: BC Managed Care – PPO | Admitting: *Deleted

## 2014-03-24 VITALS — BP 119/80 | HR 84 | Ht 70.0 in | Wt 201.0 lb

## 2014-03-24 DIAGNOSIS — Z952 Presence of prosthetic heart valve: Secondary | ICD-10-CM | POA: Insufficient documentation

## 2014-03-24 DIAGNOSIS — Z954 Presence of other heart-valve replacement: Secondary | ICD-10-CM | POA: Insufficient documentation

## 2014-03-24 DIAGNOSIS — J948 Other specified pleural conditions: Secondary | ICD-10-CM

## 2014-03-24 DIAGNOSIS — I351 Nonrheumatic aortic (valve) insufficiency: Secondary | ICD-10-CM

## 2014-03-24 DIAGNOSIS — J90 Pleural effusion, not elsewhere classified: Secondary | ICD-10-CM

## 2014-03-24 DIAGNOSIS — Z5181 Encounter for therapeutic drug level monitoring: Secondary | ICD-10-CM

## 2014-03-24 DIAGNOSIS — Z23 Encounter for immunization: Secondary | ICD-10-CM

## 2014-03-24 DIAGNOSIS — I719 Aortic aneurysm of unspecified site, without rupture: Secondary | ICD-10-CM

## 2014-03-24 DIAGNOSIS — R911 Solitary pulmonary nodule: Secondary | ICD-10-CM

## 2014-03-24 LAB — POCT INR: INR: 2.9

## 2014-03-24 MED ORDER — METOPROLOL TARTRATE 25 MG PO TABS: 25.0000 mg | ORAL_TABLET | Freq: Two times a day (BID) | ORAL | Status: DC

## 2014-03-24 NOTE — Telephone Encounter (Signed)
Pt has an appt to be seen today and will change INR goal to 2.0 to 3.0

## 2014-03-24 NOTE — Progress Notes (Signed)
Cardiology Office Note   Date:  03/24/2014   ID:  Jeffrey [name], DOB 01-Dec-1965, MRN [id number]  PCP:  Beverley FiedlerANKINS,VICTORIA, MD  Cardiologist:  Dr. Tonny BollmanMichael Cooper      History of Present Illness: Jeffrey [name] is a 48 y.o. male who was recently admitted 11/10-11/23 with symptomatic severe aortic insufficiency in the setting of ascending thoracic aortic aneurysm.  He was initially seen in his primary care physician's office with abdominal discomfort, abdominal bloating and cough. He was noted to have a very loud murmur on exam and was sent to the emergency room. Workup demonstrated severe aortic insufficiency and a dilated aortic root with fusiform aneurysm extending to the proximal arch with maximum diameter of 6.8 cm.  LHC demonstrated no CAD.  He was referred to [facility] and underwent Bentall procedure by Dr. Donata ClayVan Trigt with aortic root replacement using a mechanical valve-conduit and reimplantation of coronary arteries (St. Jude 27 mm valve), replacement of ascending aorta to the proximal arch with a 30 mm Dacron graft.  Postoperative course was notable for left pleural effusion. He did not require thoracentesis.  He returns for FU.  His chest remains somewhat sore. Overall, he is doing very well. He is walking without significant dyspnea. He denies orthopnea or PND. He denies syncope, fevers, cough.   Studies:   - LHC (11/15):  EF 55%, marked ascending aortic dilatation with severe AI on aortic root angiography, normal coronary arteries  - Echo (11/15):  Mild LVH, EF 50%, diffuse HK, prominent apical trabeculations raise concern for non-compaction, grade 2 diastolic dysfunction, severe AI, severely dilated ascending aorta (6.5 cm), moderate LAE, normal RV function, mild RAE, PASP 42 mmHg, small effusion  - TEE (11/15): Mild LVH, EF 45-50%, diffuse HK, prominent apical trabeculations, severe AI, severely dilated ascending aorta (6.9 cm), mild MR   Recent Labs: 02/18/2014: Pro B  Natriuretic peptide (BNP) 2601.0*; TSH 2.090 02/27/2014: ALT 23 02/28/2014: BUN 24*; Creatinine 0.92; Potassium 3.6*; Sodium 129* 03/01/2014: Hemoglobin 8.7*    Recent Radiology: Ct Angio Chest Aortic Dissect W &/or W/o  02/19/2014      IMPRESSION: No evidence of pulmonary emboli. A small right pleural effusion is noted.  Ascending aortic aneurysm as described above.  Nodular changes within the right upper lobe. If the patient is at high risk for bronchogenic carcinoma, follow-up chest CT at 3-376months is recommended. If the patient is at low risk for bronchogenic carcinoma, follow-up chest CT at 6-12 months is recommended. This recommendation follows the consensus statement: Guidelines for Management of Small Pulmonary Nodules Detected on CT Scans: A Statement from the Fleischner Society as published in Radiology 2005; 237:395-400.  Variant anatomy of the left vertebral artery and left gastric artery.   Electronically Signed   By: Alcide CleverMark  Lukens M.D.   On: 02/19/2014 17:04     Wt Readings from Last 3 Encounters:  03/24/14 201 lb (91.173 kg)  03/03/14 217 lb 6.4 oz (98.612 kg)     Past Medical History  Diagnosis Date  . Gout     Current Outpatient Prescriptions  Medication Sig Dispense Refill  . acetaminophen (TYLENOL) 500 MG tablet Take 500 mg by mouth every 6 (six) hours as needed.    Marland Kitchen. aspirin 81 MG EC tablet Take 1 tablet (81 mg total) by mouth daily.    . ferrous fumarate-b12-vitamic C-folic acid (TRINSICON / FOLTRIN) capsule Take 1 capsule by mouth daily. For one month then stop. 30 capsule 0  . metoprolol tartrate (LOPRESSOR) 25 MG tablet  Take 0.5 tablets (12.5 mg total) by mouth 2 (two) times daily. 30 tablet 1  . warfarin (COUMADIN) 7.5 MG tablet Take 1 tablet (7.5 mg total) by mouth daily. 30 tablet 1   No current facility-administered medications for this visit.     Allergies:   Review of patient's allergies indicates no known allergies.   Social History:  The patient   reports that he has never smoked. He does not have any smokeless tobacco history on file. He reports that he does not drink alcohol or use illicit drugs.   Family History:  The patient's family history includes Cancer in his maternal grandmother. There is no history of Heart attack or Stroke.    ROS:  Please see the history of present illness.       All other systems reviewed and negative.    PHYSICAL EXAM: VS:  BP 119/80 mmHg  Pulse 84  Ht 5\' 10"  (1.778 m)  Wt 201 lb (91.173 kg)  BMI 28.84 kg/m2 Well nourished, well developed, in no acute distress HEENT: normal Neck: no JVD Cardiac:  normal S1, mechanical S2;  RRR; no  murmur   Lungs:  Decreased breath sounds at the left base with egophony, no rales, no wheezes Abd: soft, nontender, no hepatomegaly Ext: no edema Skin: warm and dry Neuro:  CNs 2-12 intact, no focal abnormalities noted  EKG:  NSR, HR 84, inferolateral T-wave inversions, no change from prior tracing      ASSESSMENT AND PLAN:   1.  Severe Aortic Insufficiency in the Setting of Ascending Thoracic Aortic Aneurysm s/p Bentall with Mechanical AVR:  He is progressing well after his recent surgery.  We discussed the importance of SBE prophylaxis.      -  He will be referred to cardiac rehab.      -  Arrange follow-up echocardiogram. Heart rate could be better. I will increase his metoprolol to 25 mg twice a day.     -  Follow-up with the Coumadin clinic for management of his Coumadin. 2.  Lung Nodules:  Lifelong non-smoker.  Arrange follow-up CT without contrast in 6 months. 3.  L Pleural Effusion:  He has evidence of persistent effusion on exam. He is currently not symptomatic.  FU with Dr. Donata Clay as planned. Chest x-ray will be obtained at that time.     Disposition:   FU with Dr. [name] as planned.    Signed, Brynda Rim, MHS 03/24/2014 11:22 AM    [facility] [address] [place], [place], [place]  [address] Phone: [phone]; Fax: [phone]

## 2014-03-24 NOTE — Patient Instructions (Addendum)
YOU WILL NEED TO HAVE A CHEST CT W/O CONTRAST TO BE DONE IN 6 MONTHS; DX LUNG NODULE  Your physician has requested that you have an echocardiogram. Echocardiography is a painless test that uses sound waves to create images of your heart. It provides your doctor with information about the size and shape of your heart and how well your heart's chambers and valves are working. This procedure takes approximately one hour. There are no restrictions for this procedure.   INCREASE METOPROLOL TO 25 MG 1 TABLET TWICE DAILY; NEW RX SENT IN WITH NEW DIRECTIONS  You have been referred to CARDIAC REHAB TO BE DONE AT [place]   YOU HAVE BEEN GIVEN A CARD CALLED AN SBE CARD; MAKE SURE TO CARRY THIS CARD WITH YOU AT ALL TIMES AND SHOW WHEN EVER HAVING KIND OF PROCEDURES OR DENTAL WORK.  KEEP YOUR APPT WITH DR. [name] 04/2014 AS PLANNED ALREADY

## 2014-03-24 NOTE — Telephone Encounter (Signed)
-----   Message from Kerin Perna, MD sent at 03/23/2014  5:49 AM EST -----  2.0- 3.0 is ok would not chg dose at 2.4  thanks ----- Message -----    From: Jeannine Kitten, RN    Sent: 03/17/2014  12:11 PM      To: Kerin Perna, MD  Saw pt in coumadin clinic as new pt and he states he was told INR goal range was 2.0 to 2.5 usually we would have goal range 2.0 to 3.0  His INR today was 2.4  Please advise Thank you Lelon Perla RN

## 2014-03-25 ENCOUNTER — Encounter (HOSPITAL_COMMUNITY): Payer: Self-pay | Admitting: Cardiovascular Disease

## 2014-03-25 ENCOUNTER — Telehealth: Payer: Self-pay | Admitting: Physician Assistant

## 2014-03-25 ENCOUNTER — Encounter: Payer: Self-pay | Admitting: Physician Assistant

## 2014-03-25 ENCOUNTER — Ambulatory Visit (HOSPITAL_COMMUNITY): Payer: BC Managed Care – PPO | Attending: Physician Assistant | Admitting: Cardiology

## 2014-03-25 DIAGNOSIS — Z952 Presence of prosthetic heart valve: Secondary | ICD-10-CM

## 2014-03-25 DIAGNOSIS — Z954 Presence of other heart-valve replacement: Secondary | ICD-10-CM

## 2014-03-25 DIAGNOSIS — Z48812 Encounter for surgical aftercare following surgery on the circulatory system: Secondary | ICD-10-CM | POA: Insufficient documentation

## 2014-03-25 NOTE — Progress Notes (Signed)
Echo performed. 

## 2014-03-25 NOTE — Telephone Encounter (Signed)
I rtnd pt's call, not sure who called him but I had not called pt today. His echo results have not yet been read.

## 2014-03-25 NOTE — Telephone Encounter (Signed)
New message ° ° ° ° ° °Returning Jeffrey [name]'s call ° ° ° ° ° °

## 2014-03-26 ENCOUNTER — Telehealth: Payer: Self-pay | Admitting: Cardiovascular Disease

## 2014-03-26 ENCOUNTER — Encounter (HOSPITAL_COMMUNITY): Payer: Self-pay | Admitting: Cardiovascular Disease

## 2014-03-26 ENCOUNTER — Encounter: Payer: Self-pay | Admitting: Cardiothoracic Surgery

## 2014-03-26 DIAGNOSIS — Z952 Presence of prosthetic heart valve: Secondary | ICD-10-CM

## 2014-03-26 DIAGNOSIS — I719 Aortic aneurysm of unspecified site, without rupture: Secondary | ICD-10-CM

## 2014-03-26 NOTE — Telephone Encounter (Signed)
Ordered limited echo for patient and forward to [facility] for scheduling.

## 2014-03-26 NOTE — Telephone Encounter (Signed)
I have reviewed the patient's echo findings with him over the telephone. Also reviewed plan with Dr. Donata Clay. We have asked him to hold warfarin 3 days and liberalize greens in his diet. He will start back on warfarin Saturday, December 19 at his previous dose. He will come into the office Monday for a limited echocardiogram to assess his pericardial effusion. Further plans pending the results of his have follow-up echo. He continues to feel well. He will continue on ASA 81 mg daily.  [name] 03/26/2014 1:53 PM

## 2014-03-31 ENCOUNTER — Ambulatory Visit (INDEPENDENT_AMBULATORY_CARE_PROVIDER_SITE_OTHER): Payer: BC Managed Care – PPO | Admitting: *Deleted

## 2014-03-31 ENCOUNTER — Ambulatory Visit (HOSPITAL_COMMUNITY): Payer: BC Managed Care – PPO | Attending: Cardiovascular Disease | Admitting: Radiology

## 2014-03-31 ENCOUNTER — Other Ambulatory Visit: Payer: Self-pay | Admitting: Cardiothoracic Surgery

## 2014-03-31 DIAGNOSIS — I509 Heart failure, unspecified: Secondary | ICD-10-CM | POA: Insufficient documentation

## 2014-03-31 DIAGNOSIS — I358 Other nonrheumatic aortic valve disorders: Secondary | ICD-10-CM | POA: Insufficient documentation

## 2014-03-31 DIAGNOSIS — I719 Aortic aneurysm of unspecified site, without rupture: Secondary | ICD-10-CM

## 2014-03-31 DIAGNOSIS — I313 Pericardial effusion (noninflammatory): Secondary | ICD-10-CM | POA: Diagnosis not present

## 2014-03-31 DIAGNOSIS — Z952 Presence of prosthetic heart valve: Secondary | ICD-10-CM

## 2014-03-31 DIAGNOSIS — R0600 Dyspnea, unspecified: Secondary | ICD-10-CM | POA: Diagnosis not present

## 2014-03-31 DIAGNOSIS — Z5181 Encounter for therapeutic drug level monitoring: Secondary | ICD-10-CM

## 2014-03-31 LAB — POCT INR: INR: 1.4

## 2014-03-31 NOTE — Progress Notes (Signed)
Limited Echocardiogram performed for Pericardial Effusion.  

## 2014-04-01 ENCOUNTER — Ambulatory Visit (INDEPENDENT_AMBULATORY_CARE_PROVIDER_SITE_OTHER): Payer: Self-pay | Admitting: Cardiothoracic Surgery

## 2014-04-01 ENCOUNTER — Encounter: Payer: Self-pay | Admitting: Cardiothoracic Surgery

## 2014-04-01 ENCOUNTER — Encounter (HOSPITAL_COMMUNITY): Payer: Self-pay | Admitting: *Deleted

## 2014-04-01 ENCOUNTER — Other Ambulatory Visit: Payer: Self-pay | Admitting: *Deleted

## 2014-04-01 ENCOUNTER — Ambulatory Visit
Admission: RE | Admit: 2014-04-01 | Discharge: 2014-04-01 | Disposition: A | Discharge status: Home / Self Care | Payer: BC Managed Care – PPO | Source: Ambulatory Visit | Attending: Cardiothoracic Surgery | Admitting: Cardiothoracic Surgery

## 2014-04-01 ENCOUNTER — Telehealth: Payer: Self-pay | Admitting: *Deleted

## 2014-04-01 VITALS — BP 125/83 | HR 90 | Resp 20 | Ht 70.0 in | Wt 201.0 lb

## 2014-04-01 DIAGNOSIS — I319 Disease of pericardium, unspecified: Secondary | ICD-10-CM

## 2014-04-01 DIAGNOSIS — I3139 Other pericardial effusion (noninflammatory): Secondary | ICD-10-CM

## 2014-04-01 DIAGNOSIS — I351 Nonrheumatic aortic (valve) insufficiency: Secondary | ICD-10-CM

## 2014-04-01 DIAGNOSIS — I719 Aortic aneurysm of unspecified site, without rupture: Secondary | ICD-10-CM

## 2014-04-01 DIAGNOSIS — I313 Pericardial effusion (noninflammatory): Secondary | ICD-10-CM

## 2014-04-01 NOTE — Progress Notes (Signed)
Anesthesia Note:  Contacted after 4:30PM regarding a late add on for tomorrow.  SAME DAY WORK-UP.  Patient is a 48 year old male scheduled for a subxyphoid pericardial window by Dr. Donata Clay. He is s/p Bentall Procedure (St. Jude 27 mm AVR and replacement of proximal arch with a 30 mm Dacron graft) on 02/24/14.  He had widely patent coronaries by cath on 02/20/14.   03/31/14 Echo:  - Limited echo done to compare to 12/15 S/P AVR with aortic aneurysm repair with Bental. Moderate LVH EF continues to be poor. Previously read at 30-35% but looks worse to me 25-30%. AVR disc motionj not well seen Mild perivalvular leak 12/15 and normal systolic gradients although may be higher if EF normal. ? first few images of this echo with &quot;bubbles&quot; on left side of heart -LV continues to rock in pericardial space Mild MV//TV respitory variation does not meet 25% thresshold IVC is not dilated surprisiongly -There is a very large circumferential pericardial effusion. It is largest in the lateral and posterior aspects were it measures as much as 4 cm. It does appear to me to be a bit larger in the posteriorand lateral aspects. - Given the reduced EF and ongiong need for anticoagulation would consider drainage despite equivacol tamponade date and non dilated IVC. (Dr. Charlton Haws)  03/24/14: NSR, ST/T wave abnormality, consider inferolateral ischemia.  04/01/14 CXR: 1. Stable postsurgical changes. Patient has had prior aortic valve replacement. Stable severe cardiomegaly, no CHF. 2. Stable atelectasis left lower lobe. Stable elevation of the left hemidiaphragm. Stable left pleural effusion.  Patient with need for pericardial window for pericardial effusion s/p Bentall. Dr. Donata Clay instructed patient to hold Coumadin (patient reported last dose 03/30/14).  He will get labs and further evaluation by his anesthesiologist on the day of surgery.  Velna Ochs [facility] Short Stay Center/Anesthesiology Phone  [phone] 04/01/2014 5:33 PM

## 2014-04-01 NOTE — Telephone Encounter (Signed)
Tereso Newcomer, PA s/w Ryan over at Dr. Sheffield Slider office today in reference to pt's pericardial effusion from repeat echo yesterday. Per Lorin Picket and Ryan's conversation pt will be seen in Dr. Sheffield Slider office today. lmom for pt Dr. Sheffield Slider will call.

## 2014-04-01 NOTE — Progress Notes (Signed)
Spoke with [name], [place] regarding pt Coumadin instructions pre-operatively. According to [name], MD advised pt to hold ; pt stated last dose was Sunday 03/30/14. Pt was advised to take Metoprolol the morning of procedure but pt stated that he takes it at 10:00 PM and 10:00 AM  ( for his heart rate ).Spoke with [name], PA ( anesthesia) regarding pt history; no new orders given.

## 2014-04-01 NOTE — Progress Notes (Signed)
PCP is Beverley FiedlerANKINS,VICTORIA, MD Referring Provider is Wendall StadeNishan, Peter C, MD   History and physical Date of admission, 04/02/2014 Admission diagnosis-postoperative pericardial effusion Chief Complaint  Patient presents with  . Routine Post Op    F/U from surgery with CXR s/p Bentall aortic root replacement 02/24/14    ZOX:WRUEAVWHPI:patient returns for one month followup after aortic root replacement and replacement of the ascending aorta with a mechanical valve-conduit for severe AI, reduced LV function and class III-4 heart failure. Patient was started on Coumadin. He is maintained sinus rhythm. His progress clinically. His INR on December 15 was 3.0. An echocardiogram was performed and reviewed by his cardiologist. This showed a small-moderate posterior pericardial effusion. His Coumadin was stopped and he was maintained on aspirin 81 mg daily. Repeat echocardiogram performed yesterday, reviewed by me personally, shows no a large 3-4 cm pericardial effusion mainly lateral to the left ventricle. The patient remains asymptomatic. He feels better now than he did before surgery with after correction of his severe AI. LV function on his echocardiogram correlates and shows improvement in LV global function. Prosthesis has no AI with normal gradient. Chest x-ray today shows elevation of left hemidiaphragm and possibly small left pleural effusion versus atelectasis. His surgical incision is well-healed.   Past Medical History  Diagnosis Date  . Gout   . Hx of echocardiogram     post Bentall/AVR >> Echo (12/15):  Mod LVH, EF 30-35%, diff HK, Gr 1 DD, mechanical AVR ok (mean 12 mmHg), mod LAE, mild to mod reduced RVF, mild RAE, mod-large circumferential pericardial effusion without tamponade physiology.    Past Surgical History  Procedure Laterality Date  . Tee without cardioversion N/A 02/20/2014    Procedure: TRANSESOPHAGEAL ECHOCARDIOGRAM (TEE);  Surgeon: Lars MassonKatarina H Nelson, MD;  [facility];  Service:  Cardiovascular;  Laterality: N/AZachary George;  . Bentall procedure N/A 02/24/2014    Procedure: BENTALL PROCEDURE;  Surgeon: Kerin PernaPeter Van Trigt, MD;  [facility];  Service: Open Heart Surgery;  Laterality: N/A;  . Intraoperative transesophageal echocardiogram N/A 02/24/2014    Procedure: INTRAOPERATIVE TRANSESOPHAGEAL ECHOCARDIOGRAM;  Surgeon: Kerin PernaPeter Van Trigt, MD;  [facility];  Service: Open Heart Surgery;  Laterality: N/A;  . Left and right heart catheterization with coronary angiogram N/A 02/20/2014    Procedure: LEFT AND RIGHT HEART CATHETERIZATION WITH CORONARY ANGIOGRAM;  Surgeon: Micheline ChapmanMichael D Cooper, MD;  [facility];  Service: Cardiovascular;  Laterality: N/A;    Family History  Problem Relation Age of Onset  . Heart attack Neg Hx   . Stroke Neg Hx   . Cancer Maternal Grandmother     Social History History  Substance Use Topics  . Smoking status: Never Smoker   . Smokeless tobacco: Not on file  . Alcohol Use: No    Current Outpatient Prescriptions  Medication Sig Dispense Refill  . acetaminophen (TYLENOL) 500 MG tablet Take 500 mg by mouth every 6 (six) hours as needed.    Marland Kitchen. aspirin 81 MG EC tablet Take 1 tablet (81 mg total) by mouth daily.    . ferrous fumarate-b12-vitamic C-folic acid (TRINSICON / FOLTRIN) capsule Take 1 capsule by mouth daily. For one month then stop. 30 capsule 0  . metoprolol tartrate (LOPRESSOR) 25 MG tablet Take 1 tablet (25 mg total) by mouth 2 (two) times daily. 60 tablet 11  . warfarin (COUMADIN) 7.5 MG tablet Take 1 tablet (7.5 mg total) by mouth daily. 30 tablet 1   No current facility-administered medications for this visit.  No Known Allergies  Review of Systems  Improve sleep, improved appetite improved energy since discharge from the hospital No external bleeding complications from Coumadin therapy The patient is been off Coumadin for the past week. INR yesterday was 1.4. He has been taking his aspirin.  BP 125/83 mmHg  Pulse 90   Resp 20  Ht 5\' 10"  (1.778 m)  Wt 201 lb (91.173 kg)  BMI 28.84 kg/m2  SpO2 97% Physical Exam Alert and oriented asymptomatic middle-aged male Lungs clear Heart rate regular, loud aVR closure click, no murmur No pericardial friction rub No abdominal fullness No pedal edema Sternal incision well-healed, incision beneath right clavicle well-healed  Diagnostic Tests: Chest x-ray reviewed showing cardiomegaly from chronic AI  Impression: Doing well following mechanical valve-conduit Bentall procedure for severe AI  but now with moderate-large pericardial effusion which should be drained with a subxiphoid pericardial window.this will be scheduled at [place] tomorrow. He'll not take his Coumadin tonight.  Plan:admit for subxiphoid pericardial window to the OR at [place] tomorrow. Procedures indications benefits and risks discussed with patient and wife. They understand and agree to proceed with surgery.

## 2014-04-02 ENCOUNTER — Encounter (HOSPITAL_COMMUNITY)
Admission: RE | Disposition: A | Discharge status: Home / Self Care | Payer: BC Managed Care – PPO | Source: Ambulatory Visit | Attending: Cardiothoracic Surgery

## 2014-04-02 ENCOUNTER — Inpatient Hospital Stay (HOSPITAL_COMMUNITY): Payer: BLUE CROSS/BLUE SHIELD | Admitting: Vascular Surgery

## 2014-04-02 ENCOUNTER — Inpatient Hospital Stay (HOSPITAL_COMMUNITY)
Admission: RE | Admit: 2014-04-02 | Discharge: 2014-04-06 | DRG: 272 | Disposition: A | Discharge status: Home / Self Care | Payer: BLUE CROSS/BLUE SHIELD | Source: Ambulatory Visit | Attending: Cardiothoracic Surgery | Admitting: Cardiothoracic Surgery

## 2014-04-02 ENCOUNTER — Encounter (HOSPITAL_COMMUNITY): Payer: Self-pay | Admitting: *Deleted

## 2014-04-02 ENCOUNTER — Ambulatory Visit: Payer: BC Managed Care – PPO | Admitting: Cardiothoracic Surgery

## 2014-04-02 ENCOUNTER — Inpatient Hospital Stay (HOSPITAL_COMMUNITY): Payer: BLUE CROSS/BLUE SHIELD

## 2014-04-02 DIAGNOSIS — Z952 Presence of prosthetic heart valve: Secondary | ICD-10-CM | POA: Diagnosis not present

## 2014-04-02 DIAGNOSIS — I313 Pericardial effusion (noninflammatory): Secondary | ICD-10-CM

## 2014-04-02 DIAGNOSIS — J90 Pleural effusion, not elsewhere classified: Secondary | ICD-10-CM

## 2014-04-02 DIAGNOSIS — I3139 Other pericardial effusion (noninflammatory): Secondary | ICD-10-CM | POA: Diagnosis present

## 2014-04-02 DIAGNOSIS — Z7901 Long term (current) use of anticoagulants: Secondary | ICD-10-CM

## 2014-04-02 HISTORY — DX: Other pericardial effusion (noninflammatory): I31.39

## 2014-04-02 HISTORY — PX: TEE WITHOUT CARDIOVERSION: SHX5443

## 2014-04-02 HISTORY — PX: SUBXYPHOID PERICARDIAL WINDOW: SHX5075

## 2014-04-02 HISTORY — DX: Pericardial effusion (noninflammatory): I31.3

## 2014-04-02 LAB — COMPREHENSIVE METABOLIC PANEL
ALT: 34 U/L (ref 0–53)
AST: 36 U/L (ref 0–37)
Albumin: 3.2 g/dL — ABNORMAL LOW (ref 3.5–5.2)
Alkaline Phosphatase: 103 U/L (ref 39–117)
Anion gap: 8 (ref 5–15)
BUN: 5 mg/dL — ABNORMAL LOW (ref 6–23)
CO2: 23 mmol/L (ref 19–32)
Calcium: 8.5 mg/dL (ref 8.4–10.5)
Chloride: 101 mEq/L (ref 96–112)
Creatinine, Ser: 0.75 mg/dL (ref 0.50–1.35)
GFR calc Af Amer: >90 mL/min (ref >90)
GFR calc non Af Amer: >90 mL/min (ref >90)
Glucose, Bld: 113 mg/dL — ABNORMAL HIGH (ref 70–99)
Potassium: 3.8 mmol/L (ref 3.5–5.1)
Sodium: 132 mmol/L — ABNORMAL LOW (ref 135–145)
Total Bilirubin: 0.7 mg/dL (ref 0.3–1.2)
Total Protein: 6.7 g/dL (ref 6.0–8.3)

## 2014-04-02 LAB — URINALYSIS, ROUTINE W REFLEX MICROSCOPIC
Bilirubin Urine: NEGATIVE
Glucose, UA: NEGATIVE mg/dL
Hgb urine dipstick: NEGATIVE
Ketones, ur: NEGATIVE mg/dL
Leukocytes, UA: NEGATIVE
Nitrite: NEGATIVE
Protein, ur: NEGATIVE mg/dL
Specific Gravity, Urine: 1.02 (ref 1.005–1.030)
Urobilinogen, UA: 0.2 mg/dL (ref 0.0–1.0)
pH: 6.5 (ref 5.0–8.0)

## 2014-04-02 LAB — BLOOD GAS, ARTERIAL
Acid-Base Excess: 0.4 mmol/L (ref 0.0–2.0)
Bicarbonate: 23.4 mEq/L (ref 20.0–24.0)
Drawn by: 428831
FIO2: 0.21 %
O2 Saturation: 97.5 %
Patient temperature: 98.6
TCO2: 24.4 mmol/L (ref 0–100)
pCO2 arterial: 30.7 mmHg — ABNORMAL LOW (ref 35.0–45.0)
pH, Arterial: 7.495 — ABNORMAL HIGH (ref 7.350–7.450)
pO2, Arterial: 84.3 mmHg (ref 80.0–100.0)

## 2014-04-02 LAB — CBC
HCT: 32.5 % — ABNORMAL LOW (ref 39.0–52.0)
Hemoglobin: 10.2 g/dL — ABNORMAL LOW (ref 13.0–17.0)
MCH: 24.3 pg — ABNORMAL LOW (ref 26.0–34.0)
MCHC: 31.4 g/dL (ref 30.0–36.0)
MCV: 77.4 fL — ABNORMAL LOW (ref 78.0–100.0)
Platelets: 239 10*3/uL (ref 150–400)
RBC: 4.2 MIL/uL — ABNORMAL LOW (ref 4.22–5.81)
RDW: 13.7 % (ref 11.5–15.5)
WBC: 7.1 10*3/uL (ref 4.0–10.5)

## 2014-04-02 LAB — SURGICAL PCR SCREEN
MRSA, PCR: NEGATIVE
Staphylococcus aureus: NEGATIVE

## 2014-04-02 LAB — APTT: aPTT: 38 seconds — ABNORMAL HIGH (ref 24–37)

## 2014-04-02 LAB — PROTIME-INR
INR: 1.38 (ref 0.00–1.49)
Prothrombin Time: 17.1 seconds — ABNORMAL HIGH (ref 11.6–15.2)

## 2014-04-02 LAB — TYPE AND SCREEN
ABO/RH(D): O NEG
Antibody Screen: NEGATIVE

## 2014-04-02 SURGERY — CREATION, PERICARDIAL WINDOW, SUBXIPHOID APPROACH
Anesthesia: General

## 2014-04-02 MED ORDER — 0.9 % SODIUM CHLORIDE (POUR BTL) OPTIME
TOPICAL | Status: DC | PRN
  Administered 2014-04-02: 1000 mL

## 2014-04-02 MED ORDER — KCL IN DEXTROSE-NACL 20-5-0.45 MEQ/L-%-% IV SOLN
INTRAVENOUS | Status: DC
  Administered 2014-04-02 – 2014-04-03 (×2): via INTRAVENOUS
  Filled 2014-04-02 (×3): qty 1000

## 2014-04-02 MED ORDER — ONDANSETRON HCL 4 MG/2ML IJ SOLN
INTRAMUSCULAR | Status: DC | PRN
  Administered 2014-04-02: 4 mg via INTRAVENOUS

## 2014-04-02 MED ORDER — CHLORHEXIDINE GLUCONATE 0.12 % MT SOLN
15.0000 mL | Freq: Two times a day (BID) | OROMUCOSAL | Status: DC
  Administered 2014-04-02 – 2014-04-03 (×2): 15 mL via OROMUCOSAL
  Filled 2014-04-02: qty 15

## 2014-04-02 MED ORDER — PROPOFOL 10 MG/ML IV BOLUS
INTRAVENOUS | Status: AC
  Filled 2014-04-02: qty 20

## 2014-04-02 MED ORDER — DEXTROSE 5 % IV SOLN
1.5000 g | INTRAVENOUS | Status: AC
  Administered 2014-04-02: 1.5 g via INTRAVENOUS
  Filled 2014-04-02: qty 1.5

## 2014-04-02 MED ORDER — ACETAMINOPHEN 160 MG/5ML PO SOLN: 1000.0000 mg | Freq: Four times a day (QID) | ORAL | Status: DC

## 2014-04-02 MED ORDER — ONDANSETRON HCL 4 MG/2ML IJ SOLN: 4.0000 mg | Freq: Four times a day (QID) | INTRAMUSCULAR | Status: DC | PRN

## 2014-04-02 MED ORDER — WARFARIN SODIUM 2.5 MG PO TABS
2.5000 mg | ORAL_TABLET | Freq: Once | ORAL | Status: AC
  Administered 2014-04-02: 2.5 mg via ORAL
  Filled 2014-04-02: qty 1

## 2014-04-02 MED ORDER — OXYCODONE HCL 5 MG PO TABS: 5.0000 mg | ORAL_TABLET | ORAL | Status: DC | PRN

## 2014-04-02 MED ORDER — NEOSTIGMINE METHYLSULFATE 10 MG/10ML IV SOLN
INTRAVENOUS | Status: DC | PRN
  Administered 2014-04-02: 5 mg via INTRAVENOUS

## 2014-04-02 MED ORDER — LACTATED RINGERS IV SOLN
INTRAVENOUS | Status: DC | PRN
  Administered 2014-04-02: 10:00:00 via INTRAVENOUS

## 2014-04-02 MED ORDER — HYDROMORPHONE HCL 1 MG/ML IJ SOLN
0.2500 mg | INTRAMUSCULAR | Status: DC | PRN
  Administered 2014-04-02 (×4): 0.5 mg via INTRAVENOUS

## 2014-04-02 MED ORDER — MIDAZOLAM HCL 2 MG/2ML IJ SOLN
INTRAMUSCULAR | Status: AC
  Filled 2014-04-02: qty 2

## 2014-04-02 MED ORDER — FENTANYL CITRATE 0.05 MG/ML IJ SOLN
25.0000 ug | INTRAMUSCULAR | Status: DC | PRN
  Administered 2014-04-02: 25 ug via INTRAVENOUS
  Administered 2014-04-02 – 2014-04-03 (×3): 50 ug via INTRAVENOUS
  Administered 2014-04-03 (×3): 25 ug via INTRAVENOUS
  Filled 2014-04-02 (×7): qty 2

## 2014-04-02 MED ORDER — FENTANYL CITRATE 0.05 MG/ML IJ SOLN
INTRAMUSCULAR | Status: DC | PRN
  Administered 2014-04-02 (×3): 50 ug via INTRAVENOUS
  Administered 2014-04-02: 150 ug via INTRAVENOUS
  Administered 2014-04-02: 50 ug via INTRAVENOUS

## 2014-04-02 MED ORDER — MUPIROCIN 2 % EX OINT
TOPICAL_OINTMENT | CUTANEOUS | Status: AC
  Administered 2014-04-02: 1
  Filled 2014-04-02: qty 22

## 2014-04-02 MED ORDER — ASPIRIN EC 81 MG PO TBEC: 81.0000 mg | DELAYED_RELEASE_TABLET | Freq: Every day | ORAL | Status: DC

## 2014-04-02 MED ORDER — DEXTROSE 5 % IV SOLN
1.5000 g | Freq: Two times a day (BID) | INTRAVENOUS | Status: AC
  Administered 2014-04-02 – 2014-04-03 (×2): 1.5 g via INTRAVENOUS
  Filled 2014-04-02 (×2): qty 1.5

## 2014-04-02 MED ORDER — PROPOFOL 10 MG/ML IV BOLUS
INTRAVENOUS | Status: DC | PRN
  Administered 2014-04-02: 70 mg via INTRAVENOUS
  Administered 2014-04-02: 30 mg via INTRAVENOUS

## 2014-04-02 MED ORDER — GLYCOPYRROLATE 0.2 MG/ML IJ SOLN
INTRAMUSCULAR | Status: DC | PRN
  Administered 2014-04-02: .8 mg via INTRAVENOUS

## 2014-04-02 MED ORDER — MIDAZOLAM HCL 5 MG/5ML IJ SOLN
INTRAMUSCULAR | Status: DC | PRN
  Administered 2014-04-02 (×2): 1 mg via INTRAVENOUS

## 2014-04-02 MED ORDER — WARFARIN - PHYSICIAN DOSING INPATIENT
Freq: Every day | Status: DC
  Administered 2014-04-03 – 2014-04-05 (×3)

## 2014-04-02 MED ORDER — HYDROMORPHONE HCL 1 MG/ML IJ SOLN
INTRAMUSCULAR | Status: AC
  Filled 2014-04-02: qty 1

## 2014-04-02 MED ORDER — METOPROLOL TARTRATE 25 MG PO TABS
25.0000 mg | ORAL_TABLET | Freq: Two times a day (BID) | ORAL | Status: DC
  Administered 2014-04-02 – 2014-04-06 (×9): 25 mg via ORAL
  Filled 2014-04-02 (×9): qty 1

## 2014-04-02 MED ORDER — POTASSIUM CHLORIDE 10 MEQ/50ML IV SOLN: 10.0000 meq | Freq: Every day | INTRAVENOUS | Status: DC | PRN

## 2014-04-02 MED ORDER — OXYCODONE HCL 5 MG/5ML PO SOLN: 5.0000 mg | Freq: Once | ORAL | Status: DC | PRN

## 2014-04-02 MED ORDER — SENNOSIDES-DOCUSATE SODIUM 8.6-50 MG PO TABS
1.0000 | ORAL_TABLET | Freq: Every day | ORAL | Status: DC
  Filled 2014-04-02 (×3): qty 1

## 2014-04-02 MED ORDER — LACTATED RINGERS IV SOLN
INTRAVENOUS | Status: DC
  Administered 2014-04-02: 08:00:00 via INTRAVENOUS

## 2014-04-02 MED ORDER — ASPIRIN EC 81 MG PO TBEC
81.0000 mg | DELAYED_RELEASE_TABLET | Freq: Every day | ORAL | Status: DC
  Administered 2014-04-02 – 2014-04-06 (×5): 81 mg via ORAL
  Filled 2014-04-02 (×5): qty 1

## 2014-04-02 MED ORDER — SUCCINYLCHOLINE CHLORIDE 20 MG/ML IJ SOLN
INTRAMUSCULAR | Status: DC | PRN
  Administered 2014-04-02: 60 mg via INTRAVENOUS

## 2014-04-02 MED ORDER — PHENYLEPHRINE HCL 10 MG/ML IJ SOLN
10.0000 mg | INTRAMUSCULAR | Status: DC | PRN
  Administered 2014-04-02: 25 ug/min via INTRAVENOUS

## 2014-04-02 MED ORDER — BISACODYL 5 MG PO TBEC
10.0000 mg | DELAYED_RELEASE_TABLET | Freq: Every day | ORAL | Status: DC
  Administered 2014-04-02 – 2014-04-03 (×2): 10 mg via ORAL
  Filled 2014-04-02 (×2): qty 2

## 2014-04-02 MED ORDER — OXYCODONE HCL 5 MG PO TABS: 5.0000 mg | ORAL_TABLET | Freq: Once | ORAL | Status: DC | PRN

## 2014-04-02 MED ORDER — PROMETHAZINE HCL 25 MG/ML IJ SOLN: 6.2500 mg | INTRAMUSCULAR | Status: DC | PRN

## 2014-04-02 MED ORDER — PHENYLEPHRINE HCL 10 MG/ML IJ SOLN
INTRAMUSCULAR | Status: DC | PRN
  Administered 2014-04-02: 80 ug via INTRAVENOUS
  Administered 2014-04-02: 40 ug via INTRAVENOUS
  Administered 2014-04-02: 80 ug via INTRAVENOUS

## 2014-04-02 MED ORDER — FE FUMARATE-B12-VIT C-FA-IFC PO CAPS
1.0000 | ORAL_CAPSULE | Freq: Every day | ORAL | Status: DC
  Administered 2014-04-02 – 2014-04-06 (×5): 1 via ORAL
  Filled 2014-04-02 (×5): qty 1

## 2014-04-02 MED ORDER — METOPROLOL TARTRATE 25 MG PO TABS
25.0000 mg | ORAL_TABLET | ORAL | Status: AC
  Filled 2014-04-02: qty 1

## 2014-04-02 MED ORDER — SODIUM CHLORIDE 0.9 % IJ SOLN
OROMUCOSAL | Status: DC | PRN
  Administered 2014-04-02: 1 mL via TOPICAL

## 2014-04-02 MED ORDER — TRAMADOL HCL 50 MG PO TABS
50.0000 mg | ORAL_TABLET | Freq: Four times a day (QID) | ORAL | Status: DC | PRN
  Administered 2014-04-02: 50 mg via ORAL
  Administered 2014-04-03: 100 mg via ORAL
  Filled 2014-04-02: qty 1
  Filled 2014-04-02: qty 2

## 2014-04-02 MED ORDER — KCL IN DEXTROSE-NACL 20-5-0.45 MEQ/L-%-% IV SOLN
INTRAVENOUS | Status: AC
  Filled 2014-04-02: qty 1000

## 2014-04-02 MED ORDER — ARTIFICIAL TEARS OP OINT
TOPICAL_OINTMENT | OPHTHALMIC | Status: DC | PRN
  Administered 2014-04-02: 1 via OPHTHALMIC

## 2014-04-02 MED ORDER — CETYLPYRIDINIUM CHLORIDE 0.05 % MT LIQD
7.0000 mL | Freq: Two times a day (BID) | OROMUCOSAL | Status: DC
  Administered 2014-04-03 – 2014-04-05 (×5): 7 mL via OROMUCOSAL

## 2014-04-02 MED ORDER — FENTANYL CITRATE 0.05 MG/ML IJ SOLN
INTRAMUSCULAR | Status: AC
  Filled 2014-04-02: qty 5

## 2014-04-02 MED ORDER — ACETAMINOPHEN 500 MG PO TABS
1000.0000 mg | ORAL_TABLET | Freq: Four times a day (QID) | ORAL | Status: DC
  Administered 2014-04-02 – 2014-04-04 (×7): 1000 mg via ORAL
  Filled 2014-04-02 (×11): qty 2

## 2014-04-02 MED ORDER — ROCURONIUM BROMIDE 100 MG/10ML IV SOLN
INTRAVENOUS | Status: DC | PRN
  Administered 2014-04-02: 10 mg via INTRAVENOUS
  Administered 2014-04-02 (×3): 5 mg via INTRAVENOUS
  Administered 2014-04-02: 30 mg via INTRAVENOUS
  Administered 2014-04-02: 10 mg via INTRAVENOUS

## 2014-04-02 MED FILL — Sodium Chloride IV Soln 0.9%: INTRAVENOUS | Qty: 2000 | Status: AC

## 2014-04-02 MED FILL — Heparin Sodium (Porcine) Inj 1000 Unit/ML: INTRAMUSCULAR | Qty: 30 | Status: AC

## 2014-04-02 SURGICAL SUPPLY — 53 items
BENZOIN TINCTURE PRP APPL 2/3 (GAUZE/BANDAGES/DRESSINGS) ×3 IMPLANT
CANISTER SUCTION 2500CC (MISCELLANEOUS) ×3 IMPLANT
CATH ROBINSON RED A/P 18FR (CATHETERS) ×3 IMPLANT
CATH THORACIC 28FR (CATHETERS) IMPLANT
CATH THORACIC 28FR RT ANG (CATHETERS) IMPLANT
CATH THORACIC 36FR (CATHETERS) IMPLANT
CATH THORACIC 36FR RT ANG (CATHETERS) IMPLANT
CLIP TI MEDIUM 24 (CLIP) ×3 IMPLANT
CLIP TI WIDE RED SMALL 24 (CLIP) ×3 IMPLANT
CLOSURE WOUND 1/2 X4 (GAUZE/BANDAGES/DRESSINGS) ×1
CONT SPEC 4OZ CLIKSEAL STRL BL (MISCELLANEOUS) ×3 IMPLANT
COVER SURGICAL LIGHT HANDLE (MISCELLANEOUS) ×6 IMPLANT
DERMABOND ADHESIVE PROPEN (GAUZE/BANDAGES/DRESSINGS) ×2
DERMABOND ADVANCED .7 DNX6 (GAUZE/BANDAGES/DRESSINGS) ×1 IMPLANT
DRAIN CHANNEL 28F RND 3/8 FF (WOUND CARE) ×3 IMPLANT
DRAPE LAPAROSCOPIC ABDOMINAL (DRAPES) ×3 IMPLANT
DRAPE PROXIMA HALF (DRAPES) ×3 IMPLANT
ELECT BLADE 6.5 EXT (BLADE) ×3 IMPLANT
ELECT REM PT RETURN 9FT ADLT (ELECTROSURGICAL) ×3
ELECTRODE REM PT RTRN 9FT ADLT (ELECTROSURGICAL) ×1 IMPLANT
GAUZE SPONGE 4X4 12PLY STRL (GAUZE/BANDAGES/DRESSINGS) IMPLANT
GLOVE BIO SURGEON STRL SZ7.5 (GLOVE) ×6 IMPLANT
HEMOSTAT POWDER SURGIFOAM 1G (HEMOSTASIS) IMPLANT
KIT BASIN OR (CUSTOM PROCEDURE TRAY) ×3 IMPLANT
KIT ROOM TURNOVER OR (KITS) ×3 IMPLANT
NS IRRIG 1000ML POUR BTL (IV SOLUTION) ×3 IMPLANT
PACK CHEST (CUSTOM PROCEDURE TRAY) ×3 IMPLANT
PAD ARMBOARD 7.5X6 YLW CONV (MISCELLANEOUS) ×6 IMPLANT
PAD ELECT DEFIB RADIOL ZOLL (MISCELLANEOUS) ×3 IMPLANT
SPONGE GAUZE 4X4 12PLY STER LF (GAUZE/BANDAGES/DRESSINGS) ×3 IMPLANT
STRIP CLOSURE SKIN 1/2X4 (GAUZE/BANDAGES/DRESSINGS) ×2 IMPLANT
SUT SILK  1 MH (SUTURE) ×2
SUT SILK 1 MH (SUTURE) ×1 IMPLANT
SUT SILK 2 0 SH CR/8 (SUTURE) ×3 IMPLANT
SUT VIC AB 1 CTX 18 (SUTURE) ×6 IMPLANT
SUT VIC AB 1 CTX 36 (SUTURE) ×2
SUT VIC AB 1 CTX36XBRD ANBCTR (SUTURE) ×1 IMPLANT
SUT VIC AB 2-0 CT1 27 (SUTURE) ×2
SUT VIC AB 2-0 CT1 36 (SUTURE) ×3 IMPLANT
SUT VIC AB 2-0 CT1 TAPERPNT 27 (SUTURE) ×1 IMPLANT
SUT VIC AB 3-0 X1 27 (SUTURE) ×3 IMPLANT
SWAB COLLECTION DEVICE MRSA (MISCELLANEOUS) IMPLANT
SYR 50ML SLIP (SYRINGE) IMPLANT
SYRINGE 10CC LL (SYRINGE) IMPLANT
SYSTEM SAHARA CHEST DRAIN ATS (WOUND CARE) ×3 IMPLANT
TAPE CLOTH SURG 4X10 WHT LF (GAUZE/BANDAGES/DRESSINGS) ×3 IMPLANT
TOWEL OR 17X24 6PK STRL BLUE (TOWEL DISPOSABLE) ×3 IMPLANT
TOWEL OR 17X26 10 PK STRL BLUE (TOWEL DISPOSABLE) ×9 IMPLANT
TRAP SPECIMEN MUCOUS 40CC (MISCELLANEOUS) ×6 IMPLANT
TRAY FOLEY CATH 14FRSI W/METER (CATHETERS) IMPLANT
TRAY FOLEY IC TEMP SENS 16FR (CATHETERS) ×3 IMPLANT
TUBE ANAEROBIC SPECIMEN COL (MISCELLANEOUS) IMPLANT
WATER STERILE IRR 1000ML POUR (IV SOLUTION) ×6 IMPLANT

## 2014-04-02 NOTE — Anesthesia Preprocedure Evaluation (Addendum)
Anesthesia Evaluation  Patient identified by MRN, date of birth, ID band Patient awake    Reviewed: Allergy & Precautions, H&P , NPO status , Patient's Chart, lab work & pertinent test results  Airway Mallampati: II  TM Distance: >3 FB Neck ROM: Full    Dental  (+) Teeth Intact, Dental Advisory Given   Pulmonary shortness of breath,    Pulmonary exam normal       Cardiovascular + Peripheral Vascular Disease and +CHF Rhythm:Regular Rate:Normal  S/p Bentall 02/2014. Reduced EF - 25-30%.   Neuro/Psych negative neurological ROS  negative psych ROS   GI/Hepatic negative GI ROS, Neg liver ROS,   Endo/Other  negative endocrine ROS  Renal/GU negative Renal ROS  negative genitourinary   Musculoskeletal negative musculoskeletal ROS (+)   Abdominal   Peds  Hematology  (+) anemia ,   Anesthesia Other Findings   Reproductive/Obstetrics negative OB ROS                             Anesthesia Physical  Anesthesia Plan  ASA: III  Anesthesia Plan: General   Post-op Pain Management:    Induction: Intravenous  Airway Management Planned: Oral ETT  Additional Equipment: Arterial line and TEE  Intra-op Plan:   Post-operative Plan: Extubation in OR  Informed Consent: I have reviewed the patients History and Physical, chart, labs and discussed the procedure including the risks, benefits and alternatives for the proposed anesthesia with the patient or authorized representative who has indicated his/her understanding and acceptance.   Dental advisory given and Dental Advisory Given  Plan Discussed with: Surgeon, CRNA and Anesthesiologist  Anesthesia Plan Comments:        Anesthesia Quick Evaluation

## 2014-04-02 NOTE — Progress Notes (Signed)
The patient was examined and preop studies reviewed. There has been no change from the prior exam and the patient is ready for surgery.   Plan subxyphoid pericardial window on R Cundari today

## 2014-04-02 NOTE — Progress Notes (Signed)
CT surgery p.m. Rounds  Patient resting comfortably after subxiphoid pericardial window for postop large pericardial effusion status post Bentall replacement with mechanical valve 4 weeks ago  Will resume Coumadin X-ray shows significant atelectasis-Will mobilize patient out of bed tomorrow

## 2014-04-02 NOTE — Anesthesia Procedure Notes (Signed)
Procedure Name: Intubation Date/Time: 04/02/2014 11:12 AM Performed by: Carmela Rima Pre-anesthesia Checklist: Patient being monitored, Suction available, Emergency Drugs available, Patient identified and Timeout performed Patient Re-evaluated:Patient Re-evaluated prior to inductionOxygen Delivery Method: Circle system utilized Preoxygenation: Pre-oxygenation with 100% oxygen Intubation Type: IV induction and Rapid sequence Ventilation: Mask ventilation without difficulty Laryngoscope Size: Miller Grade View: Grade I Tube type: Oral Number of attempts: 1 Placement Confirmation: positive ETCO2,  ETT inserted through vocal cords under direct vision and breath sounds checked- equal and bilateral Secured at: 23 cm Tube secured with: Tape Dental Injury: Teeth and Oropharynx as per pre-operative assessment

## 2014-04-02 NOTE — Progress Notes (Signed)
  Echocardiogram Echocardiogram Transesophageal has been performed.  Delcie Roch 04/02/2014, 11:08 AM

## 2014-04-02 NOTE — Brief Op Note (Signed)
04/02/2014  12:45 PM  PATIENT:  Jeffrey [name]  48 y.o. male  PRE-OPERATIVE DIAGNOSIS:  PERICARDIAL EFFUSION  POST-OPERATIVE DIAGNOSIS:  PERICARDIAL EFFUSION  PROCEDURE:   SUBXYPHOID PERICARDIAL WINDOW  SURGEON:  Kerin Perna, MD  ASSISTANT: Coral Ceo, PA-C  ANESTHESIA:   general   FINDINGS: 600 ml serosanguinous fluid drained, fluid and pericardial bx sent for cx's and pathology  EBL: minimal  DRAINS: 28 Fr CT  PATIENT CONDITION:  PACU - hemodynamically stable.

## 2014-04-02 NOTE — Transfer of Care (Signed)
Immediate Anesthesia Transfer of Care Note  Patient: Jeffrey [name]  Procedure(s) Performed: Procedure(s): SUBXYPHOID PERICARDIAL WINDOW (N/A) TRANSESOPHAGEAL ECHOCARDIOGRAM (TEE) (N/A)  Patient Location: PACU  Anesthesia Type:General  Level of Consciousness: awake, alert  and oriented  Airway & Oxygen Therapy: Patient Spontanous Breathing and Patient connected to face mask oxygen  Post-op Assessment: Report given to PACU RN, Post -op Vital signs reviewed and stable and Patient moving all extremities X 4  Post vital signs: Reviewed and stable  Complications: No apparent anesthesia complications

## 2014-04-02 NOTE — Anesthesia Postprocedure Evaluation (Signed)
  Anesthesia Post-op Note  Patient: Jeffrey [name]  Procedure(s) Performed: Procedure(s): SUBXYPHOID PERICARDIAL WINDOW (N/A) TRANSESOPHAGEAL ECHOCARDIOGRAM (TEE) (N/A)  Patient Location: PACU and SICU  Anesthesia Type:General  Level of Consciousness: Patient remains intubated per anesthesia plan  Airway and Oxygen Therapy: Patient remains intubated per anesthesia plan  Post-op Pain: mild  Post-op Assessment: Post-op Vital signs reviewed  Post-op Vital Signs: Reviewed  Last Vitals:  Filed Vitals:   04/02/14 1345  BP: 153/74  Pulse: 85  Temp:   Resp: 30    Complications: No apparent anesthesia complications

## 2014-04-03 ENCOUNTER — Encounter (HOSPITAL_COMMUNITY): Payer: Self-pay | Admitting: Cardiothoracic Surgery

## 2014-04-03 ENCOUNTER — Inpatient Hospital Stay (HOSPITAL_COMMUNITY): Payer: BLUE CROSS/BLUE SHIELD

## 2014-04-03 LAB — BASIC METABOLIC PANEL
Anion gap: 6 (ref 5–15)
BUN: <5 mg/dL — ABNORMAL LOW (ref 6–23)
CALCIUM: 8.4 mg/dL (ref 8.4–10.5)
CO2: 24 mmol/L (ref 19–32)
Chloride: 101 mEq/L (ref 96–112)
Creatinine, Ser: 0.6 mg/dL (ref 0.50–1.35)
GFR calc non Af Amer: >90 mL/min (ref >90)
Glucose, Bld: 116 mg/dL — ABNORMAL HIGH (ref 70–99)
Potassium: 4.3 mmol/L (ref 3.5–5.1)
Sodium: 131 mmol/L — ABNORMAL LOW (ref 135–145)

## 2014-04-03 LAB — BLOOD GAS, ARTERIAL
Acid-Base Excess: 0 mmol/L (ref 0.0–2.0)
Bicarbonate: 23.8 mEq/L (ref 20.0–24.0)
FIO2: 0.28 %
O2 Saturation: 95.5 %
PCO2 ART: 36.5 mmHg (ref 35.0–45.0)
PH ART: 7.43 (ref 7.350–7.450)
PO2 ART: 75.4 mmHg — AB (ref 80.0–100.0)
Patient temperature: 98.6
TCO2: 24.9 mmol/L (ref 0–100)

## 2014-04-03 LAB — CBC
HCT: 31.5 % — ABNORMAL LOW (ref 39.0–52.0)
Hemoglobin: 9.7 g/dL — ABNORMAL LOW (ref 13.0–17.0)
MCH: 23.7 pg — ABNORMAL LOW (ref 26.0–34.0)
MCHC: 30.8 g/dL (ref 30.0–36.0)
MCV: 77 fL — AB (ref 78.0–100.0)
PLATELETS: 242 10*3/uL (ref 150–400)
RBC: 4.09 MIL/uL — ABNORMAL LOW (ref 4.22–5.81)
RDW: 13.7 % (ref 11.5–15.5)
WBC: 9.5 10*3/uL (ref 4.0–10.5)

## 2014-04-03 LAB — PROTIME-INR
INR: 1.37 (ref 0.00–1.49)
Prothrombin Time: 17 seconds — ABNORMAL HIGH (ref 11.6–15.2)

## 2014-04-03 MED ORDER — LIDOCAINE HCL (PF) 1 % IJ SOLN
INTRAMUSCULAR | Status: AC
  Filled 2014-04-03: qty 10

## 2014-04-03 MED ORDER — KCL IN DEXTROSE-NACL 20-5-0.45 MEQ/L-%-% IV SOLN
INTRAVENOUS | Status: DC
  Administered 2014-04-03: 08:00:00 via INTRAVENOUS
  Filled 2014-04-03: qty 1000

## 2014-04-03 MED ORDER — WARFARIN SODIUM 5 MG PO TABS
5.0000 mg | ORAL_TABLET | Freq: Every day | ORAL | Status: AC
  Administered 2014-04-03: 5 mg via ORAL
  Filled 2014-04-03: qty 1

## 2014-04-03 MED ORDER — FUROSEMIDE 40 MG PO TABS
40.0000 mg | ORAL_TABLET | Freq: Every day | ORAL | Status: DC
  Administered 2014-04-03 – 2014-04-06 (×4): 40 mg via ORAL
  Filled 2014-04-03 (×4): qty 1

## 2014-04-03 NOTE — Op Note (Signed)
NAMEPRINCETON, CARNAHAN NO.:  [id number]  MEDICAL RECORD NO.:  [id number]  LOCATION:  2S05C                        FACILITY:  [facility]  PHYSICIAN:  Kerin Perna, M.D.  DATE OF BIRTH:  [birth year]  DATE OF PROCEDURE:  04/02/2014 DATE OF DISCHARGE:                              OPERATIVE REPORT   OPERATION:  Subxiphoid pericardial window and drainage of pericardial effusion.  PREOPERATIVE DIAGNOSIS:  Large pericardial effusion status post AVR- Bentall procedure for severe AI and ascending thoracic aneurysm.  POSTOPERATIVE DIAGNOSIS:  Large pericardial effusion status post AVR- Bentall procedure for severe AI and ascending thoracic aneurysm.  SURGEON:  Kerin Perna, M.D..  ASSISTANT:  Coral Ceo, PA-C.  ANESTHESIA:  General.  OPERATIVE PROCEDURE:  The patient was brought from the preoperative holding area to the operating room and placed supine on the operating table.  General anesthesia was induced under invasive hemodynamic monitoring.  The transesophageal probe was placed by the anesthesiologist and confirmed the preoperative diagnosis of a large lateral pericardial effusion.  LV function appeared to be adequate and the aortic valve was functioning normally without AI.  The patient was prepped from his neck to his groin and draped as a sterile field.  A proper time-out was performed.  A small incision was made from the lower part of the previous sternotomy to below the xiphoid.  Dissection was carried down through the subcutaneous fat and the fascia was opened.  The xiphoid was excised.  The sternal elevating retractor was used under the tip of the lower sternum to elevate the sternum.  The soft tissue underneath the sternum was dissected away to expose the pericardium.  The pericardium was opened.  There were fairly dense adhesions to the right but fairly thin adhesions to the left. Dissection to the thin adhesions allowed [place] to open into a pocket  were 600-700 mL of slightly serosanguineous fluid was drained.  The adhesions were cleared as could be performed safely.  Warm saline was used to irrigate the space.  The surface of the heart did not appear to be inflamed but there were adhesions which impaired good visualization of the epicardium.  After clearing the pericardial effusion, a 28-French angled tube was brought out through separate incision and placed dependently in the pericardial space and secured to the skin and connected to an underwater seal Pleur-Evac drainage system.  Good hemostasis was achieved.  The sternal retractor was removed.  The fascia was then closed with interrupted #1 Vicryl.  Subcutaneous layer was closed in running Vicryl and skin was close a subcuticular.  The patient was extubated and returned recovery room in stable condition.     Kerin Perna, M.D.     PV/MEDQ  D:  04/02/2014  T:  04/03/2014  Job:  [id number]  cc:   Veverly Fells. [name], MD

## 2014-04-03 NOTE — Progress Notes (Signed)
1 Day Post-Op Procedure(s) (LRB): SUBXYPHOID PERICARDIAL WINDOW (N/A) TRANSESOPHAGEAL ECHOCARDIOGRAM (TEE) (N/A) Subjective: Drainage of large lateral loculated pericardial effusion-600 cc, following Bentall procedure with mechanical valve-conduit Minimal pericardial drainage since surgery We'll leave pericardial drain in until tomorrow Resume Coumadin for mechanical aVR-5 mg daily Patient has moderate left loculated pleural effusion-ultrasound-guided thoracentesis planned to bed afebrile sinus rhythm  Objective: Vital signs in last 24 hours: Temp:  [97.5 F (36.4 C)-98.6 F (37 C)] 98.3 F (36.8 C) (12/24 0400) Pulse Rate:  [78-98] 94 (12/24 0600) Cardiac Rhythm:  [-] Normal sinus rhythm (12/24 0400) Resp:  [19-39] 39 (12/24 0600) BP: (124-170)/(74-96) 135/86 mmHg (12/24 0600) SpO2:  [93 %-100 %] 93 % (12/24 0600) Arterial Line BP: (136-189)/(74-88) 161/83 mmHg (12/24 0600)  Hemodynamic parameters for last 24 hours:    Intake/Output from previous day: 12/23 0701 - 12/24 0700 In: 2951.7 [I.V.:2901.7; IV Piggyback:50] Out: 1390 [Urine:735; Chest Tube:55] Intake/Output this shift:    Diminished breath sounds on left  Lab Results:  Recent Labs  04/02/14 0828 04/03/14 0400  WBC 7.1 9.5  HGB 10.2* 9.7*  HCT 32.5* 31.5*  PLT 239 242   BMET:  Recent Labs  04/02/14 0828 04/03/14 0400  NA 132* 131*  K 3.8 4.3  CL 101 101  CO2 23 24  GLUCOSE 113* 116*  BUN 5* <5*  CREATININE 0.75 0.60  CALCIUM 8.5 8.4    PT/INR:  Recent Labs  04/03/14 0400  LABPROT 17.0*  INR 1.37   ABG    Component Value Date/Time   PHART 7.430 04/03/2014 0355   HCO3 23.8 04/03/2014 0355   TCO2 24.9 04/03/2014 0355   ACIDBASEDEF 2.0 02/25/2014 0826   O2SAT 95.5 04/03/2014 0355   CBG (last 3)  No results for input(s): GLUCAP in the last 72 hours.  Assessment/Plan: S/P Procedure(s) (LRB): SUBXYPHOID PERICARDIAL WINDOW (N/A) TRANSESOPHAGEAL ECHOCARDIOGRAM (TEE) (N/A) Remove  pericardial drain tomorrow Resume Coumadin for mechanical aVR   LOS: 1 day    VAN TRIGT [name],Jeffrey Nuon 04/03/2014

## 2014-04-03 NOTE — Progress Notes (Signed)
Patient ID: Garison Heyser, male   DOB: 1965/10/15, 48 y.o.   MRN: [id number] EVENING ROUNDS NOTE :     [address]       [name] [address]             [phone]                 1 Day Post-Op Procedure(s) (LRB): SUBXYPHOID PERICARDIAL WINDOW (N/A) TRANSESOPHAGEAL ECHOCARDIOGRAM (TEE) (N/A)  Total Length of Stay:  LOS: 1 day  BP 144/85 mmHg  Pulse 86  Temp(Src) 97.6 F (36.4 C) (Oral)  Resp 23  Ht 5\' 10"  (1.778 m)  Wt 201 lb (91.173 kg)  BMI 28.84 kg/m2  SpO2 98%  .Intake/Output      12/23 0701 - 12/24 0700 12/24 0701 - 12/25 0700   I.V. (mL/kg) 2901.7 (31.8)    IV Piggyback 50    Total Intake(mL/kg) 2951.7 (32.4)    Urine (mL/kg/hr) 735 (0.3)    Other 600 (0.3)    Chest Tube 55 (0)    Total Output 1390     Net +1561.7            . dextrose 5 % and 0.45 % NaCl with KCl 20 mEq/L 10 mL/hr at 04/03/14 0900     Lab Results  Component Value Date   WBC 9.5 04/03/2014   HGB 9.7* 04/03/2014   HCT 31.5* 04/03/2014   PLT 242 04/03/2014   GLUCOSE 116* 04/03/2014   ALT 34 04/02/2014   AST 36 04/02/2014   NA 131* 04/03/2014   K 4.3 04/03/2014   CL 101 04/03/2014   CREATININE 0.60 04/03/2014   BUN <5* 04/03/2014   CO2 24 04/03/2014   TSH 2.090 02/18/2014   INR 1.37 04/03/2014   HGBA1C 5.8* 02/18/2014   Feels well after drainage of pericardial effusion reslated to elevated INR  Poss remove mt tube in am  Delight Ovens MD  Beeper [phone] Office [phone] 04/03/2014 5:25 PM

## 2014-04-03 NOTE — Progress Notes (Signed)
Patient ID: Jeffrey [name], male   DOB: 1965/09/17, 48 y.o.   MRN: [id number]   Request for L thoracentesis  [place] limited chest revealed very small pleural effusion without good window to gain access NO thoracentesis performed today

## 2014-04-04 ENCOUNTER — Inpatient Hospital Stay (HOSPITAL_COMMUNITY): Payer: BLUE CROSS/BLUE SHIELD

## 2014-04-04 LAB — CBC
HEMATOCRIT: 31 % — AB (ref 39.0–52.0)
Hemoglobin: 9.7 g/dL — ABNORMAL LOW (ref 13.0–17.0)
MCH: 23.8 pg — ABNORMAL LOW (ref 26.0–34.0)
MCHC: 31.3 g/dL (ref 30.0–36.0)
MCV: 76.2 fL — ABNORMAL LOW (ref 78.0–100.0)
PLATELETS: 223 10*3/uL (ref 150–400)
RBC: 4.07 MIL/uL — AB (ref 4.22–5.81)
RDW: 13.7 % (ref 11.5–15.5)
WBC: 9.9 10*3/uL (ref 4.0–10.5)

## 2014-04-04 LAB — COMPREHENSIVE METABOLIC PANEL
ALT: 19 U/L (ref 0–53)
AST: 16 U/L (ref 0–37)
Albumin: 2.7 g/dL — ABNORMAL LOW (ref 3.5–5.2)
Alkaline Phosphatase: 99 U/L (ref 39–117)
Anion gap: 6 (ref 5–15)
BILIRUBIN TOTAL: 0.6 mg/dL (ref 0.3–1.2)
BUN: 5 mg/dL — ABNORMAL LOW (ref 6–23)
CHLORIDE: 100 meq/L (ref 96–112)
CO2: 26 mmol/L (ref 19–32)
CREATININE: 0.77 mg/dL (ref 0.50–1.35)
Calcium: 8.4 mg/dL (ref 8.4–10.5)
GFR calc Af Amer: >90 mL/min (ref >90)
Glucose, Bld: 120 mg/dL — ABNORMAL HIGH (ref 70–99)
POTASSIUM: 3.9 mmol/L (ref 3.5–5.1)
Sodium: 132 mmol/L — ABNORMAL LOW (ref 135–145)
Total Protein: 5.9 g/dL — ABNORMAL LOW (ref 6.0–8.3)

## 2014-04-04 LAB — PROTIME-INR
INR: 1.56 — ABNORMAL HIGH (ref 0.00–1.49)
Prothrombin Time: 18.8 seconds — ABNORMAL HIGH (ref 11.6–15.2)

## 2014-04-04 MED ORDER — PANTOPRAZOLE SODIUM 40 MG PO TBEC
40.0000 mg | DELAYED_RELEASE_TABLET | Freq: Every day | ORAL | Status: DC
  Administered 2014-04-05 – 2014-04-06 (×2): 40 mg via ORAL
  Filled 2014-04-04 (×2): qty 1

## 2014-04-04 MED ORDER — POTASSIUM CHLORIDE CRYS ER 20 MEQ PO TBCR
20.0000 meq | EXTENDED_RELEASE_TABLET | Freq: Every day | ORAL | Status: DC
  Administered 2014-04-04 – 2014-04-06 (×3): 20 meq via ORAL
  Filled 2014-04-04 (×3): qty 1

## 2014-04-04 MED ORDER — DOCUSATE SODIUM 100 MG PO CAPS
200.0000 mg | ORAL_CAPSULE | Freq: Every day | ORAL | Status: DC
Start: 2014-04-04 — End: 2014-04-06
  Filled 2014-04-04 (×3): qty 2

## 2014-04-04 MED ORDER — COUMADIN BOOK
1.0000 | Freq: Once | Status: AC
  Administered 2014-04-04: 1
  Filled 2014-04-04: qty 1

## 2014-04-04 MED ORDER — SODIUM CHLORIDE 0.9 % IJ SOLN
3.0000 mL | Freq: Two times a day (BID) | INTRAMUSCULAR | Status: DC
  Administered 2014-04-04 – 2014-04-05 (×4): 3 mL via INTRAVENOUS

## 2014-04-04 MED ORDER — OXYCODONE HCL 5 MG PO TABS: 5.0000 mg | ORAL_TABLET | ORAL | Status: DC | PRN

## 2014-04-04 MED ORDER — WARFARIN VIDEO
1.0000 | Freq: Once | Status: AC
  Administered 2014-04-04: 1

## 2014-04-04 MED ORDER — SODIUM CHLORIDE 0.9 % IJ SOLN: 3.0000 mL | INTRAMUSCULAR | Status: DC | PRN

## 2014-04-04 MED ORDER — SODIUM CHLORIDE 0.9 % IV SOLN: 250.0000 mL | INTRAVENOUS | Status: DC | PRN

## 2014-04-04 MED ORDER — ACETAMINOPHEN 325 MG PO TABS
650.0000 mg | ORAL_TABLET | Freq: Four times a day (QID) | ORAL | Status: DC | PRN
  Administered 2014-04-04 – 2014-04-05 (×2): 650 mg via ORAL
  Filled 2014-04-04 (×2): qty 2

## 2014-04-04 MED ORDER — BISACODYL 5 MG PO TBEC
10.0000 mg | DELAYED_RELEASE_TABLET | Freq: Every day | ORAL | Status: DC | PRN
  Administered 2014-04-04 – 2014-04-05 (×2): 10 mg via ORAL
  Filled 2014-04-04 (×2): qty 2

## 2014-04-04 MED ORDER — BISACODYL 10 MG RE SUPP: 10.0000 mg | Freq: Every day | RECTAL | Status: DC | PRN

## 2014-04-04 MED ORDER — MOVING RIGHT ALONG BOOK
Freq: Once | Status: AC
  Administered 2014-04-04: 09:00:00
  Filled 2014-04-04: qty 1

## 2014-04-04 MED ORDER — WARFARIN SODIUM 5 MG PO TABS
5.0000 mg | ORAL_TABLET | Freq: Every day | ORAL | Status: DC
  Administered 2014-04-04: 5 mg via ORAL
  Filled 2014-04-04 (×2): qty 1

## 2014-04-04 MED ORDER — TRAMADOL HCL 50 MG PO TABS: 50.0000 mg | ORAL_TABLET | ORAL | Status: DC | PRN

## 2014-04-04 NOTE — Progress Notes (Signed)
Report called to 2W- RN, Gearldine Bienenstock. Pt transferred to 2W-31 via ambulation, meds in chart, family and belongings at bedside. No questions or complaints at this time. VS stable at time of transfer. Bedside handoff to [place], [place].  Quadre Bristol L

## 2014-04-04 NOTE — Progress Notes (Signed)
Patient ID: Jeffrey [name], male   DOB: 1965/04/23, 48 y.o.   MRN: [id number] TCTS DAILY ICU PROGRESS NOTE                   [address]            [place] [id number]          [phone]   2 Days Post-Op Procedure(s) (LRB): SUBXYPHOID PERICARDIAL WINDOW (N/A) TRANSESOPHAGEAL ECHOCARDIOGRAM (TEE) (N/A)  Total Length of Stay:  LOS: 2 days   Subjective: Feels well,  Objective: Vital signs in last 24 hours: Temp:  [97.6 F (36.4 C)-98.6 F (37 C)] 98.4 F (36.9 C) (12/25 0738) Pulse Rate:  [79-110] 89 (12/25 0800) Cardiac Rhythm:  [-] Normal sinus rhythm (12/25 0800) Resp:  [14-37] 24 (12/25 0800) BP: (117-171)/(60-99) 143/81 mmHg (12/25 0800) SpO2:  [87 %-99 %] 94 % (12/25 0800) Arterial Line BP: (161)/(81) 161/81 mmHg (12/24 1000)  Filed Weights   04/02/14 0646  Weight: 201 lb (91.173 kg)    Weight change:    Hemodynamic parameters for last 24 hours:    Intake/Output from previous day: 12/24 0701 - 12/25 0700 In: 720 [P.O.:480; I.V.:240] Out: 2960 [Urine:2925; Chest Tube:35]  Intake/Output this shift: Total I/O In: 10 [I.V.:10] Out: 0   Current Meds: Scheduled Meds: . acetaminophen  1,000 mg Oral 4 times per day   Or  . acetaminophen (TYLENOL) oral liquid 160 mg/5 mL  1,000 mg Oral 4 times per day  . antiseptic oral rinse  7 mL Mouth Rinse q12n4p  . aspirin EC  81 mg Oral Daily  . bisacodyl  10 mg Oral Daily  . ferrous fumarate-b12-vitamic C-folic acid  1 capsule Oral Daily  . furosemide  40 mg Oral Daily  . metoprolol tartrate  25 mg Oral BID  . senna-docusate  1 tablet Oral QHS  . Warfarin - Physician Dosing Inpatient   Does not apply q1800   Continuous Infusions: . dextrose 5 % and 0.45 % NaCl with KCl 20 mEq/L 10 mL/hr at 04/03/14 0900   PRN Meds:.fentaNYL, ondansetron (ZOFRAN) IV, oxyCODONE, potassium chloride, traMADol  General appearance: alert and cooperative Neurologic: intact Heart: regular rate and rhythm, S1, S2  normal, no murmur, click, rub or gallop Lungs: clear to auscultation bilaterally Abdomen: soft, non-tender; bowel sounds normal; no masses,  no organomegaly Extremities: extremities normal, atraumatic, no cyanosis or edema and Homans sign is negative, no sign of DVT Wound: wounds intact  Lab Results: CBC: Recent Labs  04/03/14 0400 04/04/14 0411  WBC 9.5 9.9  HGB 9.7* 9.7*  HCT 31.5* 31.0*  PLT 242 223   BMET:  Recent Labs  04/03/14 0400 04/04/14 0411  NA 131* 132*  K 4.3 3.9  CL 101 100  CO2 24 26  GLUCOSE 116* 120*  BUN <5* 5*  CREATININE 0.60 0.77  CALCIUM 8.4 8.4    PT/INR:  Recent Labs  04/04/14 0411  LABPROT 18.8*  INR 1.56*   Radiology: Dg Chest 2 View  04/04/2014   CLINICAL DATA:  Left pleural effusion  EXAM: CHEST  2 VIEW  COMPARISON:  04/03/2014  FINDINGS: Cardiac shadow is stable. Postsurgical changes are again seen. A right subclavian central line is noted at the cavoatrial junction. The right lung remains clear. Persistent left pleural effusion with consolidated lung is noted. The overall appearance given some variation in the films is stable. A pericardial drain is again seen and stable. Stable free air seen beneath the  right hemidiaphragm.  IMPRESSION: No significant interval change from the prior exam.  Tubes and lines as described above.  Stable pneumoperitoneum.   Electronically Signed   By: Alcide Clever M.D.   On: 04/04/2014 07:29   [place] Chest  04/03/2014   CLINICAL DATA:  Request for thoracentesis  EXAM: CHEST ULTRASOUND  COMPARISON:  04/03/2014.  FINDINGS: Left pleural fluid is identified. There is an insufficient amount for safe thoracentesis.  IMPRESSION: 1. Left pleural fluid but with insufficient amount for a safe thoracentesis to be performed.   Electronically Signed   By: Signa Kell M.D.   On: 04/03/2014 11:10   Dg Chest Port 1 View  04/03/2014   CLINICAL DATA:  History of pericardial effusion and pericardial window procedure.  EXAM:  PORTABLE CHEST - 1 VIEW  COMPARISON:  04/02/2014  FINDINGS: Again noted is lucency underneath the right hemidiaphragm consistent with pneumoperitoneum. Findings are most compatible with recent surgical procedure. Stable position of the pericardial drain in the lower chest. Right subclavian central line tip in the lower SVC region. There is a moderate-sized left pleural effusion which has not significantly changed. Persistent lucency along the left side of the cardiac shadow most likely represents gas in the pericardial space. Cardiac silhouette is grossly stable. Right lung remains clear. Changes consistent with previous aortic valve surgery.  IMPRESSION: Stable postsurgical changes with a pericardial drain. Again noted is air in the pericardial space and free intraperitoneal gas.  Moderate-sized left pleural effusion. Decreased aeration in left lung related to compressive atelectasis.   Electronically Signed   By: Richarda Overlie M.D.   On: 04/03/2014 07:39   Dg Chest Port 1 View  04/02/2014   CLINICAL DATA:  Pericardial effusion. Status post pericardial window.  EXAM: PORTABLE CHEST - 1 VIEW  COMPARISON:  04/01/2014  FINDINGS: Sequelae of prior median sternotomy and aortic valve replacement are again identified. There is a new right subclavian central venous catheter with tip projecting over the lower SVC. An inferior approach surgical drain terminates over the inferior aspect of the mediastinum left of midline. Intraperitoneal free air is seen in the upper abdomen. Moderate enlargement of the cardiac silhouette remains, with oblong lucency along the superior aspect of the left heart border potentially reflecting pneumopericardium related to recent procedure. There is a small to moderate left pleural effusion which may have increased from the prior study although direct comparison is limited by differences in patient positioning. No pneumothorax is identified. Left basilar parenchymal opacity has mildly increased.  Right lung is grossly clear.  IMPRESSION: 1. Interval postsurgical changes with right subclavian catheter and drain as above. Persistent moderate enlargement of the cardiac silhouette. 2. Upper abdominal intraperitoneal free air, presumably related to recent surgery. 3. Small to moderate left pleural effusion which may have increased from prior. Increased left basilar lung opacity suggestive of worsening atelectasis.   Electronically Signed   By: Sebastian Ache   On: 04/02/2014 13:43     Assessment/Plan: S/P Procedure(s) (LRB): SUBXYPHOID PERICARDIAL WINDOW (N/A) TRANSESOPHAGEAL ECHOCARDIOGRAM (TEE) (N/A) Mobilize Plan for transfer to step-down: see transfer orders Coumadin resumed Remove drain today   Grecia Lynk B 04/04/2014 9:01 AM

## 2014-04-05 ENCOUNTER — Inpatient Hospital Stay (HOSPITAL_COMMUNITY): Payer: BLUE CROSS/BLUE SHIELD

## 2014-04-05 LAB — BASIC METABOLIC PANEL
Anion gap: 6 (ref 5–15)
BUN: <5 mg/dL — ABNORMAL LOW (ref 6–23)
CO2: 29 mmol/L (ref 19–32)
Calcium: 8.7 mg/dL (ref 8.4–10.5)
Chloride: 103 mEq/L (ref 96–112)
Creatinine, Ser: 0.66 mg/dL (ref 0.50–1.35)
GFR calc Af Amer: >90 mL/min (ref >90)
GFR calc non Af Amer: >90 mL/min (ref >90)
Glucose, Bld: 113 mg/dL — ABNORMAL HIGH (ref 70–99)
Potassium: 4.2 mmol/L (ref 3.5–5.1)
Sodium: 138 mmol/L (ref 135–145)

## 2014-04-05 LAB — CBC
HCT: 33.2 % — ABNORMAL LOW (ref 39.0–52.0)
Hemoglobin: 10.1 g/dL — ABNORMAL LOW (ref 13.0–17.0)
MCH: 23.2 pg — ABNORMAL LOW (ref 26.0–34.0)
MCHC: 30.4 g/dL (ref 30.0–36.0)
MCV: 76.3 fL — ABNORMAL LOW (ref 78.0–100.0)
Platelets: 242 10*3/uL (ref 150–400)
RBC: 4.35 MIL/uL (ref 4.22–5.81)
RDW: 13.5 % (ref 11.5–15.5)
WBC: 7.6 10*3/uL (ref 4.0–10.5)

## 2014-04-05 LAB — BODY FLUID CULTURE: Culture: NO GROWTH

## 2014-04-05 LAB — PROTIME-INR
INR: 1.51 — ABNORMAL HIGH (ref 0.00–1.49)
Prothrombin Time: 18.4 seconds — ABNORMAL HIGH (ref 11.6–15.2)

## 2014-04-05 MED ORDER — WARFARIN SODIUM 7.5 MG PO TABS
7.5000 mg | ORAL_TABLET | Freq: Every day | ORAL | Status: DC
  Administered 2014-04-05: 7.5 mg via ORAL
  Filled 2014-04-05 (×2): qty 1

## 2014-04-05 NOTE — Progress Notes (Signed)
CARDIAC REHAB PHASE I   PRE:  Rate/Rhythm: 81  BP:  Sitting: 124/89     SaO2: 96% ra  MODE:  Ambulation: 500 ft   POST:  Rate/Rhythm: 90  BP:  Sitting: 116/77     SaO2: 97% ra  11:06am-11:21am  Patient ambulated independently at a steady pace. Stated that he is very eager to go home. Left patient in room with doctor and wife.  Bland Span, MS 04/05/2014 11:20 AM

## 2014-04-05 NOTE — Progress Notes (Addendum)
[address]       [place] [address]             [phone]      3 Days Post-Op Procedure(s) (LRB): SUBXYPHOID PERICARDIAL WINDOW (N/A) TRANSESOPHAGEAL ECHOCARDIOGRAM (TEE) (N/A) Subjective: Looks and feels well   Objective: Vital signs in last 24 hours: Temp:  [97.6 F (36.4 C)-99.4 F (37.4 C)] 98.7 F (37.1 C) (12/26 0401) Pulse Rate:  [81-98] 93 (12/26 0401) Cardiac Rhythm:  [-] Sinus tachycardia (12/26 0918) Resp:  [20] 20 (12/26 0401) BP: (136-153)/(88-99) 146/88 mmHg (12/26 0401) SpO2:  [98 %-100 %] 98 % (12/26 0401) Weight:  [201 lb (91.173 kg)] 201 lb (91.173 kg) (12/26 0401)  Hemodynamic parameters for last 24 hours:    Intake/Output from previous day: 12/25 0701 - 12/26 0700 In: 1230 [P.O.:1220; I.V.:10] Out: 4150 [Urine:4150] Intake/Output this shift: Total I/O In: 360 [P.O.:360] Out: 800 [Urine:800]  General appearance: alert, cooperative and no distress Heart: regular rate and rhythm and no rub Lungs: dim left> right base Wound: incis healing well  Lab Results:  Recent Labs  04/04/14 0411 04/05/14 0400  WBC 9.9 7.6  HGB 9.7* 10.1*  HCT 31.0* 33.2*  PLT 223 242   BMET:  Recent Labs  04/04/14 0411 04/05/14 0400  NA 132* 138  K 3.9 4.2  CL 100 103  CO2 26 29  GLUCOSE 120* 113*  BUN 5* <5*  CREATININE 0.77 0.66  CALCIUM 8.4 8.7    PT/INR:  Recent Labs  04/05/14 0400  LABPROT 18.4*  INR 1.51*   ABG    Component Value Date/Time   PHART 7.430 04/03/2014 0355   HCO3 23.8 04/03/2014 0355   TCO2 24.9 04/03/2014 0355   ACIDBASEDEF 2.0 02/25/2014 0826   O2SAT 95.5 04/03/2014 0355   CBG (last 3)  No results for input(s): GLUCAP in the last 72 hours.  Meds Scheduled Meds: . antiseptic oral rinse  7 mL Mouth Rinse q12n4p  . aspirin EC  81 mg Oral Daily  . docusate sodium  200 mg Oral Daily  . ferrous fumarate-b12-vitamic C-folic acid  1 capsule Oral Daily  . furosemide  40 mg Oral Daily  .  metoprolol tartrate  25 mg Oral BID  . pantoprazole  40 mg Oral QAC breakfast  . potassium chloride  20 mEq Oral Daily  . sodium chloride  3 mL Intravenous Q12H  . warfarin  5 mg Oral q1800  . Warfarin - Physician Dosing Inpatient   Does not apply q1800   Continuous Infusions:  PRN Meds:.sodium chloride, acetaminophen, bisacodyl **OR** bisacodyl, oxyCODONE, sodium chloride, traMADol  Xrays Dg Chest 2 View  04/05/2014   CLINICAL DATA:  Status post aortic valve replacement  EXAM: CHEST - 2 VIEW  COMPARISON:  04/04/2014  FINDINGS: Persistent left effusion with left mid and lower lung collapse/ consolidation. Stable appearance of the left hemithorax. No pneumothorax. Right lung remains clear. Postop changes from aortic valve replacement. Resolving pneumoperitoneum beneath the right hemidiaphragm. Trachea is midline. Heart remains enlarged.  IMPRESSION: Persistent left effusion with left mid and lower lung collapse/ consolidation  No pneumothorax  Resolving pneumoperitoneum   Electronically Signed   By: Ruel Favors M.D.   On: 04/05/2014 08:28   Dg Chest 2 View  04/04/2014   CLINICAL DATA:  Left pleural effusion  EXAM: CHEST  2 VIEW  COMPARISON:  04/03/2014  FINDINGS: Cardiac shadow is stable. Postsurgical changes are again seen. A right subclavian central  line is noted at the cavoatrial junction. The right lung remains clear. Persistent left pleural effusion with consolidated lung is noted. The overall appearance given some variation in the films is stable. A pericardial drain is again seen and stable. Stable free air seen beneath the right hemidiaphragm.  IMPRESSION: No significant interval change from the prior exam.  Tubes and lines as described above.  Stable pneumoperitoneum.   Electronically Signed   By: Alcide CleverMark  Lukens M.D.   On: 04/04/2014 07:29    Assessment/Plan: S/P Procedure(s) (LRB): SUBXYPHOID PERICARDIAL WINDOW (N/A) TRANSESOPHAGEAL ECHOCARDIOGRAM (TEE) (N/A)   1 cont to load  coumadin 2 will get CT of chest w/o contrast to better determine what is going on in left chest   LOS: 3 days    GOLD,WAYNE E 04/05/2014  US on chest no fluid, chest xray still ?consldiation left, will get ct of chest to evaluate  Increase coumadin dose tonight  I have seen and examined Pride Dimitrov and agree with the above assessment  and plan.  Delight OvensEdward B Sevastian Witczak MD Beeper [phone] 04/05/2014 12:52 PM

## 2014-04-06 LAB — TISSUE CULTURE
Culture: NO GROWTH
Gram Stain: NONE SEEN

## 2014-04-06 LAB — PROTIME-INR
INR: 1.56 — ABNORMAL HIGH (ref 0.00–1.49)
Prothrombin Time: 18.9 seconds — ABNORMAL HIGH (ref 11.6–15.2)

## 2014-04-06 MED ORDER — FUROSEMIDE 40 MG PO TABS: 40.0000 mg | ORAL_TABLET | Freq: Every day | ORAL | Status: DC

## 2014-04-06 MED ORDER — POTASSIUM CHLORIDE CRYS ER 20 MEQ PO TBCR: 20.0000 meq | EXTENDED_RELEASE_TABLET | Freq: Every day | ORAL | Status: DC

## 2014-04-06 MED ORDER — OXYCODONE HCL 5 MG PO TABS: 5.0000 mg | ORAL_TABLET | ORAL | Status: DC | PRN

## 2014-04-06 NOTE — Discharge Instructions (Signed)
Incision Care °An incision is when a surgeon cuts into your body tissues. After surgery, the incision needs to be cared for properly to prevent infection.  °HOME CARE INSTRUCTIONS  °· Take all medicine as directed by your caregiver. Only take over-the-counter or prescription medicines for pain, discomfort, or fever as directed by your caregiver. °· Do not remove your bandage (dressing) or get your incision wet until your surgeon gives you permission. In the event that your dressing becomes wet, dirty, or starts to smell, change the dressing and call your surgeon for instructions as soon as possible. °· Take showers. Do not take tub baths, swim, or do anything that may soak the wound until it is healed. °· Resume your normal diet and activities as directed or allowed. °· Avoid lifting any weight until you are instructed otherwise. °· Use anti-itch antihistamine medicine as directed by your caregiver. The wound may itch when it is healing. Do not pick or scratch at the wound. °· Follow up with your caregiver for stitch (suture) or staple removal as directed. °· Drink enough fluids to keep your urine clear or pale yellow. °SEEK MEDICAL CARE IF:  °· You have redness, swelling, or increasing pain in the wound that is not controlled with medicine. °· You have drainage, blood, or pus coming from the wound that lasts longer than 1 day. °· You develop muscle aches, chills, or a general ill feeling. °· You notice a bad smell coming from the wound or dressing. °· Your wound edges separate after the sutures, staples, or skin adhesive strips have been removed. °· You develop persistent nausea or vomiting. °SEEK IMMEDIATE MEDICAL CARE IF:  °· You have a fever. °· You develop a rash. °· You develop dizzy episodes or faint while standing. °· You have difficulty breathing. °· You develop any reaction or side effects to medicine given. °MAKE SURE YOU:  °· Understand these instructions. °· Will watch your condition. °· Will get help  right away if you are not doing well or get worse. °Document Released: 10/15/2004 Document Revised: 06/20/2011 Document Reviewed: 05/22/2013 °ExitCare® Patient Information ©2015 [employer]. This information is not intended to replace advice given to you by your health care provider. Make sure you discuss any questions you have with your health care provider. ° ° °

## 2014-04-06 NOTE — Progress Notes (Addendum)
[address]       [place] [address]             [phone]      4 Days Post-Op Procedure(s) (LRB): SUBXYPHOID PERICARDIAL WINDOW (N/A) TRANSESOPHAGEAL ECHOCARDIOGRAM (TEE) (N/A) Subjective: Feels fine   Objective: Vital signs in last 24 hours: Temp:  [97.9 F (36.6 C)-98.5 F (36.9 C)] 97.9 F (36.6 C) (12/27 0529) Pulse Rate:  [81-92] 81 (12/27 0529) Cardiac Rhythm:  [-] Normal sinus rhythm (12/27 0854) Resp:  [16-20] 16 (12/27 0529) BP: (133-134)/(79-91) 133/91 mmHg (12/27 0529) SpO2:  [100 %] 100 % (12/27 0529) Weight:  [198 lb 6.6 oz (90 kg)] 198 lb 6.6 oz (90 kg) (12/27 0529)  Hemodynamic parameters for last 24 hours:    Intake/Output from previous day: 12/26 0701 - 12/27 0700 In: 720 [P.O.:720] Out: 2700 [Urine:2700] Intake/Output this shift:    General appearance: alert, cooperative and no distress Heart: regular rate and rhythm Lungs: dim in left lower fields Abdomen: benign Extremities: no edema Wound: incis all healing well  Lab Results:  Recent Labs  04/04/14 0411 04/05/14 0400  WBC 9.9 7.6  HGB 9.7* 10.1*  HCT 31.0* 33.2*  PLT 223 242   BMET:  Recent Labs  04/04/14 0411 04/05/14 0400  NA 132* 138  K 3.9 4.2  CL 100 103  CO2 26 29  GLUCOSE 120* 113*  BUN 5* <5*  CREATININE 0.77 0.66  CALCIUM 8.4 8.7    PT/INR:  Recent Labs  04/06/14 0430  LABPROT 18.9*  INR 1.56*   ABG    Component Value Date/Time   PHART 7.430 04/03/2014 0355   HCO3 23.8 04/03/2014 0355   TCO2 24.9 04/03/2014 0355   ACIDBASEDEF 2.0 02/25/2014 0826   O2SAT 95.5 04/03/2014 0355   CBG (last 3)  No results for input(s): GLUCAP in the last 72 hours.  Meds Scheduled Meds: . antiseptic oral rinse  7 mL Mouth Rinse q12n4p  . aspirin EC  81 mg Oral Daily  . docusate sodium  200 mg Oral Daily  . ferrous fumarate-b12-vitamic C-folic acid  1 capsule Oral Daily  . furosemide  40 mg Oral Daily  . metoprolol tartrate  25 mg Oral BID   . pantoprazole  40 mg Oral QAC breakfast  . potassium chloride  20 mEq Oral Daily  . sodium chloride  3 mL Intravenous Q12H  . warfarin  7.5 mg Oral q1800  . Warfarin - Physician Dosing Inpatient   Does not apply q1800   Continuous Infusions:  PRN Meds:.sodium chloride, acetaminophen, bisacodyl **OR** bisacodyl, oxyCODONE, sodium chloride, traMADol  Xrays Dg Chest 2 View  04/05/2014   CLINICAL DATA:  Status post aortic valve replacement  EXAM: CHEST - 2 VIEW  COMPARISON:  04/04/2014  FINDINGS: Persistent left effusion with left mid and lower lung collapse/ consolidation. Stable appearance of the left hemithorax. No pneumothorax. Right lung remains clear. Postop changes from aortic valve replacement. Resolving pneumoperitoneum beneath the right hemidiaphragm. Trachea is midline. Heart remains enlarged.  IMPRESSION: Persistent left effusion with left mid and lower lung collapse/ consolidation  No pneumothorax  Resolving pneumoperitoneum   Electronically Signed   By: Ruel Favors M.D.   On: 04/05/2014 08:28   Ct Chest Wo Contrast  04/05/2014   CLINICAL DATA:  Left pleural effusion  EXAM: CT CHEST WITHOUT CONTRAST  TECHNIQUE: Multidetector CT imaging of the chest was performed following the standard protocol without IV contrast.  COMPARISON:  02/19/2014  FINDINGS: Prosthetic aortic valve and aortic root replacement has been performed. Graft material extends from the valve to the aortic arch. There is low-density surrounding the graft material likely related to organizing thrombus in the aneurysm sac which has been excluded.  There is a small amount of pneumomediastinum anterior to the heart and pulmonary artery. There is also gas in the soft tissues about the xiphoid process. There is trace free intraperitoneal gas over the liver dome. This likely represents recent pericardial tube placement with removal. There is no pneumothorax.  Moderate left pleural effusion. There is a small amount of airspace  disease at the base of the lingula and left lower lobe. Airspace disease does extend into the anterior and upper left upper lobe. Right lung is clear.  Small lymph nodes at the thoracic inlet. Sternotomy wires in place. No vertebral compression deformity.  IMPRESSION: Moderate size left pleural effusion associated with a small amount of airspace disease at the base of the left upper and lower lobes as described.  There is pneumomediastinum and other areas of gas likely related to recent pericardial chest tube placement and removal. Correlate with history.   Electronically Signed   By: Maryclare BeanArt  Hoss M.D.   On: 04/05/2014 14:33    Assessment/Plan: S/P Procedure(s) (LRB): SUBXYPHOID PERICARDIAL WINDOW (N/A) TRANSESOPHAGEAL ECHOCARDIOGRAM (TEE) (N/A) Plan for discharge: see discharge orders Coumadin 7.5 mg and recheck in coumadin clinic tomorrow Will give lasix /K for one week for effusion  LOS: 4 days    [name],[name] 04/06/2014  CT reviewed, will not try to repeat US Plan d/c and follow up with Dr Donata ClayVan Trigt and cardiology ( to mange coumadin  See tomorrow) I have seen and examined Jeffrey [name] and agree with the above assessment  and plan.  Delight OvensEdward B Antwain Caliendo MD [facility] [phone] Office [phone] 04/06/2014 11:39 AM

## 2014-04-07 ENCOUNTER — Ambulatory Visit (INDEPENDENT_AMBULATORY_CARE_PROVIDER_SITE_OTHER): Payer: BC Managed Care – PPO | Admitting: Pharmacist

## 2014-04-07 ENCOUNTER — Encounter: Payer: Self-pay | Admitting: Cardiothoracic Surgery

## 2014-04-07 DIAGNOSIS — Z5181 Encounter for therapeutic drug level monitoring: Secondary | ICD-10-CM

## 2014-04-07 DIAGNOSIS — I719 Aortic aneurysm of unspecified site, without rupture: Secondary | ICD-10-CM

## 2014-04-07 LAB — POCT INR: INR: 2

## 2014-04-08 NOTE — Anesthesia Postprocedure Evaluation (Signed)
  Anesthesia Post-op Note  Patient: Jeffrey [name]  Procedure(s) Performed: Procedure(s): SUBXYPHOID PERICARDIAL WINDOW (N/A) TRANSESOPHAGEAL ECHOCARDIOGRAM (TEE) (N/A)  Patient Location: PACU  Anesthesia Type:General  Level of Consciousness: awake and alert   Airway and Oxygen Therapy: Patient Spontanous Breathing  Post-op Pain: none  Post-op Assessment: Post-op Vital signs reviewed  Post-op Vital Signs: Reviewed  Last Vitals:  Filed Vitals:   04/06/14 0529  BP: 133/91  Pulse: 81  Temp: 36.6 C  Resp: 16    Complications: No apparent anesthesia complications

## 2014-04-09 ENCOUNTER — Ambulatory Visit (INDEPENDENT_AMBULATORY_CARE_PROVIDER_SITE_OTHER): Payer: BC Managed Care – PPO

## 2014-04-09 ENCOUNTER — Other Ambulatory Visit: Payer: Self-pay

## 2014-04-09 DIAGNOSIS — I319 Disease of pericardium, unspecified: Secondary | ICD-10-CM

## 2014-04-09 DIAGNOSIS — Z4802 Encounter for removal of sutures: Secondary | ICD-10-CM

## 2014-04-09 NOTE — Progress Notes (Signed)
Removed 1 suture from chest tube site, there was a small amount of serosanguinous drainage with no signs of infection and patient tolerated well.

## 2014-04-14 ENCOUNTER — Other Ambulatory Visit: Payer: Self-pay | Admitting: Cardiothoracic Surgery

## 2014-04-14 DIAGNOSIS — I351 Nonrheumatic aortic (valve) insufficiency: Secondary | ICD-10-CM

## 2014-04-15 ENCOUNTER — Ambulatory Visit (INDEPENDENT_AMBULATORY_CARE_PROVIDER_SITE_OTHER): Payer: BLUE CROSS/BLUE SHIELD

## 2014-04-15 DIAGNOSIS — Z5181 Encounter for therapeutic drug level monitoring: Secondary | ICD-10-CM

## 2014-04-15 DIAGNOSIS — I719 Aortic aneurysm of unspecified site, without rupture: Secondary | ICD-10-CM

## 2014-04-15 LAB — POCT INR: INR: 2.5

## 2014-04-15 NOTE — Discharge Summary (Signed)
Physician Discharge Summary  Patient ID: Jeffrey [name] MRN: [id number] DOB/AGE: [birth year] 49 y.o.  Admit date: 04/02/2014 Discharge date: 04/15/2014  Admission Diagnoses: Pericardial effusion  Discharge Diagnoses:  Active Problems:   Pericardial effusion  Patient Active Problem List   Diagnosis Date Noted  . Pericardial effusion 04/02/2014  . S/P AVR (aortic valve replacement) and aortoplasty 03/24/2014  . AI (aortic insufficiency)   . Severe aortic insufficiency 02/24/2014  . CHF exacerbation   . Aortic aneurysm 02/20/2014  . Aortic regurgitation 02/20/2014  . Dyspnea 02/18/2014    Discharged Condition: good  Hospital Course: Patient is a 49 year old male who was recently hospitalized 11/10 through 11/23 for symptomatic severe aortic insufficiency in the setting of an ascending thoracic aortic aneurysm. He underwent Bentall procedure with aortic root replacement and mechanical valve conduit by Dr. Donata Clay. He was readmitted this hospitalization due to a large paracardial effusion that developed requiring paricardial window.  Consults: Cardiothoracic surgery  Significant Diagnostic Studies: Echocardiography   Surgical procedure: DATE OF PROCEDURE: 04/02/2014 DATE OF DISCHARGE:   OPERATIVE REPORT   OPERATION: Subxiphoid pericardial window and drainage of pericardial effusion.  PREOPERATIVE DIAGNOSIS: Large pericardial effusion status post AVR- Bentall procedure for severe AI and ascending thoracic aneurysm.  POSTOPERATIVE DIAGNOSIS: Large pericardial effusion status post AVR- Bentall procedure for severe AI and ascending thoracic aneurysm.  SURGEON: Kerin Perna, M.D..  ASSISTANT: Coral Ceo, PA-C.  ANESTHESIA: General.  Postoperative hospital course:  The patient did well. He remained hemodynamically stable. The tube was discontinued without difficulty as drainage had been very minimal after the initial  drainage procedure. He did have some bile overload and was to be continued on Lasix postoperatively as well as potassium. His Coumadin was restarted for his mechanical valve. Overall he was deemed to be acceptable for discharge on 04/08/2014.  Discharge Exam: Blood pressure 133/91, pulse 81, temperature 97.9 F (36.6 C), temperature source Oral, resp. rate 16, height 5\' 10"  (1.778 m), weight 198 lb 6.6 oz (90 kg), SpO2 100 %.  General appearance: alert, cooperative and no distress Heart: regular rate and rhythm Lungs: dim in left lower fields Abdomen: benign Extremities: no edema Wound: incis all healing well   Disposition: 01-Home or Self Care     Medication List    STOP taking these medications        ferrous fumarate-b12-vitamic C-folic acid capsule  Commonly known as:  TRINSICON / FOLTRIN      TAKE these medications        acetaminophen 500 MG tablet  Commonly known as:  TYLENOL  Take 500 mg by mouth every 6 (six) hours as needed.     aspirin 81 MG EC tablet  Take 1 tablet (81 mg total) by mouth daily.     furosemide 40 MG tablet  Commonly known as:  LASIX  Take 1 tablet (40 mg total) by mouth daily.     metoprolol tartrate 25 MG tablet  Commonly known as:  LOPRESSOR  Take 1 tablet (25 mg total) by mouth 2 (two) times daily.     oxyCODONE 5 MG immediate release tablet  Commonly known as:  Oxy IR/ROXICODONE  Take 1-2 tablets (5-10 mg total) by mouth every 4 (four) hours as needed for severe pain.     potassium chloride SA 20 MEQ tablet  Commonly known as:  K-DUR,KLOR-CON  Take 1 tablet (20 mEq total) by mouth daily.     warfarin 7.5 MG tablet  Commonly known as:  COUMADIN  Take  1 tablet (7.5 mg total) by mouth daily.           Follow-up Information    Follow up with [facility].   Why:  have blood test arranged for tomorrow    Contact information:   [address] [place] [address]       Follow up with [facility].   Why:  nurse at surgeons office to remove suture. Office will call you      Follow up with VAN Dinah Beers, MD.   Specialty:  Cardiothoracic Surgery   Why:  office will contact you   Contact information:   [address] [place] [address] [phone]       Signed: Rowe Clack 04/15/2014, 1:29 PM

## 2014-04-15 NOTE — H&P (Signed)
PCP is Beverley Fiedler, MD Referring Provider is Wendall Stade, MD   History and physical Date of admission, 04/02/2014 Admission diagnosis-postoperative pericardial effusion Chief Complaint  Patient presents with  . Routine Post Op    F/U from surgery with CXR s/p Bentall aortic root replacement 02/24/14    HPI: patient returns for one month followup after aortic root replacement and replacement of the ascending aorta with a mechanical valve-conduit for severe AI, reduced LV function and class III-4 heart failure. Patient was started on Coumadin. He is maintained sinus rhythm. His progress clinically. His INR on December 15 was 3.0. An echocardiogram was performed and reviewed by his cardiologist. This showed a small-moderate posterior pericardial effusion. His Coumadin was stopped and he was maintained on aspirin 81 mg daily. Repeat echocardiogram performed yesterday, reviewed by me personally, shows no a large 3-4 cm pericardial effusion mainly lateral to the left ventricle. The patient remains asymptomatic. He feels better now than he did before surgery with after correction of his severe AI. LV function on his echocardiogram correlates and shows improvement in LV global function. Prosthesis has no AI with normal gradient. Chest x-ray today shows elevation of left hemidiaphragm and possibly small left pleural effusion versus atelectasis. His surgical incision is well-healed.   Past Medical History: Past Medical History  Diagnosis Date  . Gout   . Hx of echocardiogram     post Bentall/AVR >> Echo (12/15): Mod LVH, EF 30-35%, diff HK, Gr 1 DD, mechanical AVR ok (mean 12 mmHg), mod LAE, mild to mod reduced RVF, mild RAE, mod-large circumferential pericardial effusion without tamponade physiology.   Past Surgical History: Past Surgical History  Procedure Laterality Date  . Tee without cardioversion N/A 02/20/2014    Procedure: TRANSESOPHAGEAL  ECHOCARDIOGRAM (TEE); Surgeon: Lars Masson, MD; Location: [facility] ENDOSCOPY; Service: Cardiovascular; Laterality: N/AZachary George procedure N/A 02/24/2014    Procedure: BENTALL PROCEDURE; Surgeon: Kerin Perna, MD; [facility]; Service: Open Heart Surgery; Laterality: N/A;  . Intraoperative transesophageal echocardiogram N/A 02/24/2014    Procedure: INTRAOPERATIVE TRANSESOPHAGEAL ECHOCARDIOGRAM; Surgeon: Kerin Perna, MD; Location: [facility] OR; Service: Open Heart Surgery; Laterality: N/A;  . Left and right heart catheterization with coronary angiogram N/A 02/20/2014    Procedure: LEFT AND RIGHT HEART CATHETERIZATION WITH CORONARY ANGIOGRAM; Surgeon: Micheline Chapman, MD; Location: [facility] CATH LAB; Service: Cardiovascular; Laterality: N/A;   Family History: Family History  Problem Relation Age of Onset  . Heart attack Neg Hx   . Stroke Neg Hx   . Cancer Maternal Grandmother     Social History History  Substance Use Topics  . Smoking status: Never Smoker   . Smokeless tobacco: Not on file  . Alcohol Use: No   Medications: Current Outpatient Prescriptions  Medication Sig Dispense Refill  . acetaminophen (TYLENOL) 500 MG tablet Take 500 mg by mouth every 6 (six) hours as needed.    Marland Kitchen aspirin 81 MG EC tablet Take 1 tablet (81 mg total) by mouth daily.    . ferrous fumarate-b12-vitamic C-folic acid (TRINSICON / FOLTRIN) capsule Take 1 capsule by mouth daily. For one month then stop. 30 capsule 0  . metoprolol tartrate (LOPRESSOR) 25 MG tablet Take 1 tablet (25 mg total) by mouth 2 (two) times daily. 60 tablet 11  . warfarin (COUMADIN) 7.5 MG tablet Take 1 tablet (7.5 mg total) by mouth daily. 30 tablet 1   No current facility-administered medications for this visit.    Allergies: No Known Allergies  Review of Systems  Improve sleep, improved appetite improved energy since  discharge from the hospital No external bleeding complications from Coumadin therapy The patient is been off Coumadin for the past week. INR yesterday was 1.4. He has been taking his aspirin.   Physical Exam: BP 125/83 mmHg  Pulse 90  Resp 20  Ht  (1.778 m)  Wt 201 lb (91.173 kg)  BMI 28.84 kg/m2  SpO2 97% Physical Exam Alert and oriented asymptomatic middle-aged male Lungs clear Heart rate regular, loud aVR closure click, no murmur No pericardial friction rub No abdominal fullness No pedal edema Sternal incision well-healed, incision beneath right clavicle well-healed  Diagnostic Tests: Chest x-ray reviewed showing cardiomegaly from chronic AI  Impression: Doing well following mechanical valve-conduit Bentall procedure for severe AI but now with moderate-large pericardial effusion which should be drained with a subxiphoid pericardial window.this will be scheduled at [facility] tomorrow. He'll not take his Coumadin tonight.  Plan:admit for subxiphoid pericardial window to the OR at [facility] tomorrow. Procedures indications benefits and risks discussed with patient and wife. They understand and agree to proceed with surgery.          patient examined and medical record reviewed,agree with above note. VAN TRIGT III,PETER 05/09/2014

## 2014-04-16 ENCOUNTER — Encounter: Payer: Self-pay | Admitting: Cardiothoracic Surgery

## 2014-04-16 ENCOUNTER — Ambulatory Visit (INDEPENDENT_AMBULATORY_CARE_PROVIDER_SITE_OTHER): Payer: Self-pay | Admitting: Cardiothoracic Surgery

## 2014-04-16 ENCOUNTER — Ambulatory Visit
Admission: RE | Admit: 2014-04-16 | Discharge: 2014-04-16 | Disposition: A | Discharge status: Home / Self Care | Payer: BLUE CROSS/BLUE SHIELD | Source: Ambulatory Visit | Attending: Cardiothoracic Surgery | Admitting: Cardiothoracic Surgery

## 2014-04-16 VITALS — BP 130/88 | HR 77 | Resp 16 | Ht 70.0 in | Wt 196.5 lb

## 2014-04-16 DIAGNOSIS — I719 Aortic aneurysm of unspecified site, without rupture: Secondary | ICD-10-CM

## 2014-04-16 DIAGNOSIS — I351 Nonrheumatic aortic (valve) insufficiency: Secondary | ICD-10-CM

## 2014-04-16 DIAGNOSIS — I313 Pericardial effusion (noninflammatory): Secondary | ICD-10-CM

## 2014-04-16 DIAGNOSIS — I319 Disease of pericardium, unspecified: Secondary | ICD-10-CM

## 2014-04-16 DIAGNOSIS — I3139 Other pericardial effusion (noninflammatory): Secondary | ICD-10-CM

## 2014-04-16 NOTE — Progress Notes (Signed)
PCP is Jeffrey Fiedler, MD Referring Provider is Jeffrey Stade, MD  Chief Complaint  Patient presents with  . Routine Post Op    F/U from surgery with CXR    GEX:[id number] returns for surgical followup. The patient is 6 weeks postop Bentall procedure with mechanical valve-conduit replacement aortic root and ascending aorta. The patient is 3 weeks postop subxiphoid pericardial window for postop bloody pericardial effusion associated with Coumadin and INR 3.0 He is doing well clinically without symptoms of failure and the incisions are well-healed. Last INR is 2.5. He is being managed by the Coumadin clinic. He is ready to turn to work and is given a release, written. He is ready to start outpatient cardiac rehabilitation. He'll remain on his current medications. He needs to avoid lifting more than 20 pounds until mid February.  Chest x-ray today shows significant reduction cardiac size, mild elevation of left hemidiaphragm without pleural effusion. Improved postop left lung aeration at the left base  Past Medical History  Diagnosis Date  . Gout   . Hx of echocardiogram     post Bentall/AVR >> Echo (12/15):  Mod LVH, EF 30-35%, diff HK, Gr 1 DD, mechanical AVR ok (mean 12 mmHg), mod LAE, mild to mod reduced RVF, mild RAE, mod-large circumferential pericardial effusion without tamponade physiology.  . Pericardial effusion     Past Surgical History  Procedure Laterality Date  . Tee without cardioversion N/A 02/20/2014    Procedure: TRANSESOPHAGEAL ECHOCARDIOGRAM (TEE);  Surgeon: Jeffrey Masson, MD;  Location: [facility] ENDOSCOPY;  Service: Cardiovascular;  Laterality: N/AZachary [name] procedure N/A 02/24/2014    Procedure: BENTALL PROCEDURE;  Surgeon: Jeffrey Perna, MD;  Location: [facility] OR;  Service: Open Heart Surgery;  Laterality: N/A;  . Intraoperative transesophageal echocardiogram N/A 02/24/2014    Procedure: INTRAOPERATIVE TRANSESOPHAGEAL ECHOCARDIOGRAM;  Surgeon: Jeffrey Perna,  MD;  Location: [facility] OR;  Service: Open Heart Surgery;  Laterality: N/A;  . Left and right heart catheterization with coronary angiogram N/A 02/20/2014    Procedure: LEFT AND RIGHT HEART CATHETERIZATION WITH CORONARY ANGIOGRAM;  Surgeon: Jeffrey Chapman, MD;  Location: [facility] CATH LAB;  Service: Cardiovascular;  Laterality: N/A;  . Subxyphoid pericardial window N/A 04/02/2014    Procedure: SUBXYPHOID PERICARDIAL WINDOW;  Surgeon: Jeffrey Perna, MD;  Location: [facility] OR;  Service: Thoracic;  Laterality: N/A;  . Tee without cardioversion N/A 04/02/2014    Procedure: TRANSESOPHAGEAL ECHOCARDIOGRAM (TEE);  Surgeon: Jeffrey Perna, MD;  Location: [facility] OR;  Service: Thoracic;  Laterality: N/A;    Family History  Problem Relation Age of Onset  . Heart attack Neg Hx   . Stroke Neg Hx   . Cancer Maternal Grandmother     Social History History  Substance Use Topics  . Smoking status: Never Smoker   . Smokeless tobacco: Not on file  . Alcohol Use: No    Current Outpatient Prescriptions  Medication Sig Dispense Refill  . acetaminophen (TYLENOL) 500 MG tablet Take 500 mg by mouth every 6 (six) hours as needed.    Marland Kitchen aspirin 81 MG EC tablet Take 1 tablet (81 mg total) by mouth daily.    . metoprolol tartrate (LOPRESSOR) 25 MG tablet Take 1 tablet (25 mg total) by mouth 2 (two) times daily. 60 tablet 11  . warfarin (COUMADIN) 7.5 MG tablet Take 1 tablet (7.5 mg total) by mouth daily. 30 tablet 1   No current facility-administered medications for this visit.    No Known Allergies  Review  of Systems improved appetite and strength, heart rate slower and regular   BP 130/88 mmHg  Pulse 77  Resp 16  Ht 5\' 10"  (1.778 m)  Wt 196 lb 8 oz (89.132 kg)  BMI 28.19 kg/m2  SpO2 99% Physical Exam Alert and comfortable Lungs clear Normal sounding aortic mechanical valve without diastolic murmur Heart rhythm regular-sinus rhythm Sternal incision well-healed No pedal edema  Diagnostic Tests: Chest x-ray  reviewed  Impression: Doing well status post subxiphoid pericardial window for postoperatively pericardial effusion  Plan:continue weekly INR checks because of risk of recurrent bloody pericardial effusion. Goal INR 2.0-2.5 for the mechanical aVR in the first 3 months postop.  Return for review of progress in 4 weeks

## 2014-04-21 ENCOUNTER — Ambulatory Visit (INDEPENDENT_AMBULATORY_CARE_PROVIDER_SITE_OTHER): Payer: BLUE CROSS/BLUE SHIELD | Admitting: Pharmacist

## 2014-04-21 DIAGNOSIS — I719 Aortic aneurysm of unspecified site, without rupture: Secondary | ICD-10-CM

## 2014-04-21 DIAGNOSIS — Z5181 Encounter for therapeutic drug level monitoring: Secondary | ICD-10-CM

## 2014-04-21 DIAGNOSIS — Z954 Presence of other heart-valve replacement: Secondary | ICD-10-CM

## 2014-04-21 DIAGNOSIS — Z952 Presence of prosthetic heart valve: Secondary | ICD-10-CM

## 2014-04-21 LAB — POCT INR: INR: 2.8

## 2014-05-06 ENCOUNTER — Encounter: Payer: Self-pay | Admitting: Cardiovascular Disease

## 2014-05-06 ENCOUNTER — Encounter (HOSPITAL_COMMUNITY): Payer: Self-pay | Admitting: Cardiovascular Disease

## 2014-05-06 ENCOUNTER — Ambulatory Visit (INDEPENDENT_AMBULATORY_CARE_PROVIDER_SITE_OTHER): Payer: BLUE CROSS/BLUE SHIELD | Admitting: Cardiovascular Disease

## 2014-05-06 ENCOUNTER — Ambulatory Visit (INDEPENDENT_AMBULATORY_CARE_PROVIDER_SITE_OTHER): Payer: BLUE CROSS/BLUE SHIELD | Admitting: *Deleted

## 2014-05-06 ENCOUNTER — Ambulatory Visit
Admission: RE | Admit: 2014-05-06 | Discharge: 2014-05-06 | Disposition: A | Discharge status: Home / Self Care | Payer: BLUE CROSS/BLUE SHIELD | Source: Ambulatory Visit | Attending: Cardiovascular Disease | Admitting: Cardiovascular Disease

## 2014-05-06 VITALS — BP 134/82 | HR 76 | Ht 70.0 in | Wt 199.0 lb

## 2014-05-06 DIAGNOSIS — Z5181 Encounter for therapeutic drug level monitoring: Secondary | ICD-10-CM

## 2014-05-06 DIAGNOSIS — Z952 Presence of prosthetic heart valve: Secondary | ICD-10-CM

## 2014-05-06 DIAGNOSIS — Z954 Presence of other heart-valve replacement: Secondary | ICD-10-CM

## 2014-05-06 DIAGNOSIS — I3139 Other pericardial effusion (noninflammatory): Secondary | ICD-10-CM

## 2014-05-06 DIAGNOSIS — I319 Disease of pericardium, unspecified: Secondary | ICD-10-CM

## 2014-05-06 DIAGNOSIS — I719 Aortic aneurysm of unspecified site, without rupture: Secondary | ICD-10-CM

## 2014-05-06 DIAGNOSIS — J90 Pleural effusion, not elsewhere classified: Secondary | ICD-10-CM

## 2014-05-06 DIAGNOSIS — I351 Nonrheumatic aortic (valve) insufficiency: Secondary | ICD-10-CM

## 2014-05-06 DIAGNOSIS — I313 Pericardial effusion (noninflammatory): Secondary | ICD-10-CM

## 2014-05-06 LAB — POCT INR: INR: 2.1

## 2014-05-06 MED ORDER — CARVEDILOL 6.25 MG PO TABS: 6.2500 mg | ORAL_TABLET | Freq: Two times a day (BID) | ORAL | Status: DC

## 2014-05-06 MED ORDER — WARFARIN SODIUM 7.5 MG PO TABS
7.5000 mg | ORAL_TABLET | ORAL | Status: DC
Start: 2014-05-06 — End: 2014-09-11

## 2014-05-06 NOTE — Progress Notes (Signed)
Cardiology Office Note   Date:  05/08/2014   ID:  Jeffrey [name], DOB 02-Sep-1965, MRN [id number]  PCP:  No primary care provider on file.  Cardiologist:  [name], MD    No chief complaint on file.    History of Present Illness: Jeffrey [name] is a 49 y.o. male who presents for follow-up of aortic valve disease. He was initially hospitalized in November 2015 with congestive heart failure. The patient was diagnosed with severe aortic valve insufficiency in the setting of an ascending thoracic aortic aneurysm. He underwent Bentall procedure with aortic root replacement using a mechanical valve-conduit and coronary artery reimplantation (St. Jude 27 mm mechanical valve). His ascending aorta was replaced to the proximal arch with a 30 mm Dacron graft. A routine postoperative echo at the time of outpatient follow-up in December 2015 showed a large paracardial effusion. He underwent a subxiphoid pericardial window without complication.  He is feeling better since the subxiphoid window. Breathing is much better now and his pulse was typically over 100 bpm but now is in the 70's. He has some discomfort in the chest on the surface one is closer in contact with his surgical incisions. He has no exertional pain or pressure. He denies leg swelling, orthopnea, or PND. He's had no palpitations. He denies any bleeding problems. He has no other complaints.   Past Medical History  Diagnosis Date  . Gout   . Hx of echocardiogram     post Bentall/AVR >> Echo (12/15):  Mod LVH, EF 30-35%, diff HK, Gr 1 DD, mechanical AVR ok (mean 12 mmHg), mod LAE, mild to mod reduced RVF, mild RAE, mod-large circumferential pericardial effusion without tamponade physiology.  . Pericardial effusion     Past Surgical History  Procedure Laterality Date  . Tee without cardioversion N/A 02/20/2014    Procedure: TRANSESOPHAGEAL ECHOCARDIOGRAM (TEE);  Surgeon: Lars Masson, MD;  Location: [facility] ENDOSCOPY;   Service: Cardiovascular;  Laterality: N/AZachary George procedure N/A 02/24/2014    Procedure: BENTALL PROCEDURE;  Surgeon: Kerin Perna, MD;  Location: [facility] OR;  Service: Open Heart Surgery;  Laterality: N/A;  . Intraoperative transesophageal echocardiogram N/A 02/24/2014    Procedure: INTRAOPERATIVE TRANSESOPHAGEAL ECHOCARDIOGRAM;  Surgeon: Kerin Perna, MD;  Location: [facility] OR;  Service: Open Heart Surgery;  Laterality: N/A;  . Left and right heart catheterization with coronary angiogram N/A 02/20/2014    Procedure: LEFT AND RIGHT HEART CATHETERIZATION WITH CORONARY ANGIOGRAM;  Surgeon: Micheline Chapman, MD;  Location: [facility] CATH LAB;  Service: Cardiovascular;  Laterality: N/A;  . Subxyphoid pericardial window N/A 04/02/2014    Procedure: SUBXYPHOID PERICARDIAL WINDOW;  Surgeon: Kerin Perna, MD;  Location: [facility] OR;  Service: Thoracic;  Laterality: N/A;  . Tee without cardioversion N/A 04/02/2014    Procedure: TRANSESOPHAGEAL ECHOCARDIOGRAM (TEE);  Surgeon: Kerin Perna, MD;  Location: [facility] OR;  Service: Thoracic;  Laterality: N/A;    Current Outpatient Prescriptions  Medication Sig Dispense Refill  . acetaminophen (TYLENOL) 500 MG tablet Take 500 mg by mouth every 6 (six) hours as needed.    Marland Kitchen aspirin 81 MG EC tablet Take 1 tablet (81 mg total) by mouth daily.    Marland Kitchen warfarin (COUMADIN) 7.5 MG tablet Take 1 tablet (7.5 mg total) by mouth as directed. 30 tablet 3  . carvedilol (COREG) 6.25 MG tablet Take 1 tablet (6.25 mg total) by mouth 2 (two) times daily. 180 tablet 3   No current facility-administered medications for this visit.  Allergies:   Review of patient's allergies indicates no known allergies.   Social History:  The patient  reports that he has never smoked. He does not have any smokeless tobacco history on file. He reports that he does not drink alcohol or use illicit drugs.   Family History:  The patient's  family history includes Cancer in his maternal grandmother. There is  no history of Heart attack or Stroke.    ROS:  Please see the history of present illness.  All other systems are reviewed and negative.    PHYSICAL EXAM: VS:  BP 134/82 mmHg  Pulse 76  Ht 5\' 10"  (1.778 m)  Wt 199 lb (90.266 kg)  BMI 28.55 kg/m2 , BMI Body mass index is 28.55 kg/(m^2). GEN: Well nourished, well developed, in no acute distress HEENT: normal Neck: no JVD, carotid bruits, or masses Cardiac: RRR with a soft systolic ejection murmur at the right upper sternal border and a friction rub at the apex  Respiratory:  Diminished breath sounds in the left lower lung fields, otherwise clear  GI: soft, nontender, nondistended, + BS MS: no deformity or atrophy Skin: warm and dry, no rash Neuro:  Strength and sensation are intact Psych: euthymic mood, full affect  EKG:  EKG is ordered today. The ekg ordered today shows normal sinus rhythm with marked lateral T-wave abnormality. No significant change from previous.  Recent Labs: 02/18/2014: Pro B Natriuretic peptide (BNP) 2601.0*; TSH 2.090 02/25/2014: Magnesium 2.2 04/04/2014: ALT 19 04/05/2014: BUN <5*; Creatinine 0.66; Hemoglobin 10.1*; Platelets 242; Potassium 4.2; Sodium 138   Lipid Panel  No results found for: CHOL, TRIG, HDL, CHOLHDL, VLDL, LDLCALC, LDLDIRECT    Wt Readings from Last 3 Encounters:  05/06/14 199 lb (90.266 kg)  04/16/14 196 lb 8 oz (89.132 kg)  04/06/14 198 lb 6.6 oz (90 kg)     Other studies Reviewed: 2-D echocardiogram 7/21 2015: Study Conclusions  - Impressions: Limited echo done to compare to 12/15 S/P AVR with aortic aneurysm repair with Bental. Moderate LVH EF continues to be poor. Previously read at 30-35% but looks worse to me 25-30%. AVR disc motionj not well seen Mild perivalvular leak 12/15 and normal systolic gradients although may be higher if EF normal. ? first few images of this echo with &quot;bubbles&quot; on left side of heart  LV continues to rock in  pericardial space Mild MV//TV respitory variation does not meet 25% thresshold IVC is not dilated surprisiongly  There is a very large circumferential pericardial effusion. It is largest in the lateral and posterior aspects were it measures as much as 4 cm. It does appear to me to be a bit larger in the posterior and lateral aspects.  Givne the reduced EF and ongiong need for anticoagulation would consider drainage despite equivacol tamponade date and non dilated IVC.  Impressions:  - Limited echo done to compare to 12/15 S/P AVR with aortic aneurysm repair with Bental. Moderate LVH EF continues to be poor. Previously read at 30-35% but looks worse to me 25-30%. AVR disc motionj not well seen Mild perivalvular leak 12/15 and normal systolic gradients although may be higher if EF normal. ? first few images of this echo with &quot;bubbles&quot; on left side of heart  LV continues to rock in pericardial space Mild MV//TV respitory variation does not meet 25% thresshold IVC is not dilated surprisiongly  There is a very large circumferential pericardial effusion. It is largest in the lateral and posterior aspects were it measures as  much as 4 cm. It does appear to me to be a bit larger in the posterior and lateral aspects.  Givne the reduced EF and ongiong need for anticoagulation would consider drainage despite equivacol tamponade date and non dilated IVC.   ASSESSMENT AND PLAN: 1.  Severe aortic valve insufficiency and thoracic aortic aneurysm, now status post Bentall surgery with mechanical aortic valve replacement. The patient is clinically improving. He is maintained on a combination of low-dose aspirin and warfarin.  2. Postoperative hemorrhagic pericardial effusion in the setting of supratherapeutic INR. The patient is now status post subxiphoid window. He is doing well. I am going to repeat a limited echo  study. He does have a friction rub on exam.  3. Left pleural effusion. This appeared small at the time of his last x-ray. However, on exam he has reduced breath sounds throughout the left lower lung field. Will repeat a PA and lateral chest x-ray.  4. Severe nonischemic cardiomyopathy, likely related to long-standing aortic insufficiency. The patient's LVEF was less than 30%. Will repeat his echo study now that he has undergone treatment of his paracardial effusion. Will change metoprolol to carvedilol 6.25 mg twice daily. He is going to bring and a blood pressure log to his follow-up appointment. Will arrange an appointment with Tereso Newcomer in about 2 weeks. Hopefully he will have enough blood pressure to add an ACE inhibitor at that time. He will need continued titration of his medical therapy for his cardiomyopathy. He does not appear to have significant limitation or signs of volume overload at this point. He is New [place] Heart Association functional class I at present.   Current medicines are reviewed with the patient today.  The patient does not have concerns regarding medicines.  The following changes have been made:   Stop metoprolol Start carvedilol 6.25 mg twice a day  Labs/ tests ordered today include:  Limited 2-D echocardiogram  PA and lateral chest x-ray    Orders Placed This Encounter  Procedures  . DG Chest 2 View  . EKG 12-Lead  . 2D Echocardiogram without contrast    Disposition:   FU with Tereso Newcomer in 2 weeks, me in  2 months  Signed, [name], MD  05/08/2014 1:30 PM    [facility] [address] [place], [place], [place]  [address] Phone: [phone]; Fax: [phone]

## 2014-05-06 NOTE — Patient Instructions (Addendum)
Your physician has requested that you have a LIMITED echocardiogram. Echocardiography is a painless test that uses sound waves to create images of your heart. It provides your doctor with information about the size and shape of your heart and how well your heart's chambers and valves are working. This procedure takes approximately one hour. There are no restrictions for this procedure.  Your physician has recommended a 2 view Chest x-ray [facility] [employer] building)  Your physician has recommended you make the following change in your medication:  1. STOP Metoprolol Tartrate 2. START Carvedilol 6.25mg  take one by mouth twice a day  Your physician has requested that you regularly monitor and record your blood pressure readings at home. Please use the same machine at the same time of day to check your readings and record them to bring to your follow-up visit.  Your physician recommends that you schedule a follow-up appointment in: 2 WEEKS with Tereso Newcomer PA-C  Your physician recommends that you schedule a follow-up appointment in: 2 MONTHS with Dr [name]

## 2014-05-07 ENCOUNTER — Encounter: Payer: Self-pay | Admitting: Physician Assistant

## 2014-05-07 ENCOUNTER — Encounter (HOSPITAL_COMMUNITY): Payer: Self-pay | Admitting: Cardiovascular Disease

## 2014-05-20 ENCOUNTER — Ambulatory Visit (INDEPENDENT_AMBULATORY_CARE_PROVIDER_SITE_OTHER): Payer: BLUE CROSS/BLUE SHIELD | Admitting: *Deleted

## 2014-05-20 ENCOUNTER — Ambulatory Visit (HOSPITAL_COMMUNITY): Payer: BLUE CROSS/BLUE SHIELD | Attending: Cardiovascular Disease | Admitting: Cardiology

## 2014-05-20 VITALS — BP 132/74

## 2014-05-20 DIAGNOSIS — I313 Pericardial effusion (noninflammatory): Secondary | ICD-10-CM

## 2014-05-20 DIAGNOSIS — I719 Aortic aneurysm of unspecified site, without rupture: Secondary | ICD-10-CM

## 2014-05-20 DIAGNOSIS — Z954 Presence of other heart-valve replacement: Secondary | ICD-10-CM

## 2014-05-20 DIAGNOSIS — Z952 Presence of prosthetic heart valve: Secondary | ICD-10-CM

## 2014-05-20 DIAGNOSIS — Z5181 Encounter for therapeutic drug level monitoring: Secondary | ICD-10-CM

## 2014-05-20 DIAGNOSIS — I319 Disease of pericardium, unspecified: Secondary | ICD-10-CM | POA: Insufficient documentation

## 2014-05-20 DIAGNOSIS — I3139 Other pericardial effusion (noninflammatory): Secondary | ICD-10-CM

## 2014-05-20 LAB — POCT INR: INR: 2.2

## 2014-05-20 NOTE — Progress Notes (Signed)
Echo performed. 

## 2014-05-23 ENCOUNTER — Encounter: Payer: Self-pay | Admitting: Physician Assistant

## 2014-05-23 ENCOUNTER — Ambulatory Visit (INDEPENDENT_AMBULATORY_CARE_PROVIDER_SITE_OTHER): Payer: BLUE CROSS/BLUE SHIELD | Admitting: Physician Assistant

## 2014-05-23 VITALS — BP 142/80 | HR 78 | Ht 70.0 in | Wt 201.0 lb

## 2014-05-23 DIAGNOSIS — R911 Solitary pulmonary nodule: Secondary | ICD-10-CM

## 2014-05-23 DIAGNOSIS — Z954 Presence of other heart-valve replacement: Secondary | ICD-10-CM

## 2014-05-23 DIAGNOSIS — I429 Cardiomyopathy, unspecified: Secondary | ICD-10-CM

## 2014-05-23 DIAGNOSIS — I428 Other cardiomyopathies: Secondary | ICD-10-CM

## 2014-05-23 DIAGNOSIS — I3139 Other pericardial effusion (noninflammatory): Secondary | ICD-10-CM

## 2014-05-23 DIAGNOSIS — I351 Nonrheumatic aortic (valve) insufficiency: Secondary | ICD-10-CM

## 2014-05-23 DIAGNOSIS — I319 Disease of pericardium, unspecified: Secondary | ICD-10-CM

## 2014-05-23 DIAGNOSIS — I313 Pericardial effusion (noninflammatory): Secondary | ICD-10-CM

## 2014-05-23 DIAGNOSIS — Z952 Presence of prosthetic heart valve: Secondary | ICD-10-CM

## 2014-05-23 MED ORDER — LISINOPRIL 5 MG PO TABS: 5.0000 mg | ORAL_TABLET | Freq: Two times a day (BID) | ORAL | Status: DC

## 2014-05-23 NOTE — Patient Instructions (Signed)
Your physician recommends that you return for lab work in: BMET TO BE DONE IN 1 WEEK  START LISINOPRIL 5 MG 1 TABLET TWICE DAILY; NEW RX SENT IN TODAY  KEEP YOUR FOLLOW UP WITH DR. [name] 06/2014

## 2014-05-23 NOTE — Progress Notes (Signed)
Cardiology Office Note   Date:  05/23/2014   ID:  Jeffrey [name], DOB [birth year], MRN [id number]  PCP:  Pearla Dubonnet, MD  Cardiologist:  Dr. [name]     Chief Complaint  Patient presents with  . Cardiomyopathy    follow up  . s/p Mechanical Aortic Valve Replacement     History of Present Illness: Jeffrey [name] is a 49 y.o. male who was admitted 02/2014 with symptomatic severe aortic insufficiency in the setting of ascending thoracic aortic aneurysm (6.8 cm). LHC demonstrated no CAD. He underwent Bentall procedure by Dr. Donata Clay with aortic root replacement using a mechanical valve-conduit and reimplantation of coronary arteries (St. Jude 27 mm valve), replacement of ascending aorta to the proximal arch with a 30 mm Dacron graft.  I saw him in FU 03/2014.  FU echo demonstrated reduced EF and a large pericardial effusion.  He was admitted by Dr. Donata Clay and underwent placement of a pericardial window.  Last seen by Dr. [name] 05/06/14.  He was feeling better. Beta blocker was changed to carvedilol. Recent follow-up echocardiogram demonstrates improved LV function with an EF of 40-45% and trivial pericardial effusion.  Follow-up chest x-ray in January demonstrated minimal left effusion. He returns for follow-up today with an eye toward starting ACE inhibitor for his cardiomyopathy.  Since last seen, he has been doing well.  The patient denies any chest pain, significant dyspnea, syncope, orthopnea, PND, edema.   Studies:  2D Echocardiogram 05/20/14 - EF 40% - 45%. Wall motion was normal  - Ventricular septum: Septal motion showed paradox c/w intraventricular conduction delay. - Aortic valve: A mechanical prosthesis was present and functioning normally. - Pericardium, extracardiac: A trivial pericardial effusion was identified posterior to the heart.   Past Medical History  Diagnosis Date  . Aortic insufficiency     a. 02/2014 - admx with sx severe AI  and assoc ascending aortic aneurysm (6.8 cm) >> s/p Bentall with mechancial AVR (Dr. Donata Clay)  . Thoracic ascending aortic aneurysm     Echo 11/15: severe AI, severely dilated ascending aorta (6.5 cm);  s/p Bentall 02/2014 with aortic root replacement using a mechanical valve-conduit and reimplantation of coronary arteries (St. Jude 27 mm valve), replacement of ascending aorta to the proximal arch with a 30 mm Dacron graft  . Pericardial effusion     large pericardial effusion post Bentall procedure >> s/p pericardial window  . Hx of echocardiogram     a. Echo 11/15: EF 55%, severe AI, marked dilation of ascending aorta; b. Echo 12/15: EF 30-35%, large effusion; c. Echo 2/16: EF 40-45%, no RWMA, mechanical AVR ok, trivial effusion post to heart   . NICM (nonischemic cardiomyopathy)     a. TEE (11/15): Mild LVH, EF 45-50%, diffuse HK, prominent apical trabeculations, severe AI, severely dilated ascending aorta (6.9 cm), mild MR;  b. EF 30-35% post AVR >> c. EF 40-45% by echo 05/2014  . Hx of cardiac catheterization     a. LHC (11/15): EF 55%, marked ascending aortic dilatation with severe AI on aortic root angiography, normal coronary arteries    Past Surgical History  Procedure Laterality Date  . Tee without cardioversion N/A 02/20/2014    Procedure: TRANSESOPHAGEAL ECHOCARDIOGRAM (TEE);  Surgeon: Lars Masson, MD;  Location: [facility] ENDOSCOPY;  Service: Cardiovascular;  Laterality: N/AZachary [name] procedure N/A 02/24/2014    Procedure: BENTALL PROCEDURE;  Surgeon: Kerin Perna, MD;  Location: [facility] OR;  Service: Open Heart Surgery;  Laterality: N/A;  . Intraoperative transesophageal echocardiogram N/A 02/24/2014    Procedure: INTRAOPERATIVE TRANSESOPHAGEAL ECHOCARDIOGRAM;  Surgeon: Kerin Perna, MD;  Location: [facility] OR;  Service: Open Heart Surgery;  Laterality: N/A;  . Left and right heart catheterization with coronary angiogram N/A 02/20/2014    Procedure: LEFT AND RIGHT HEART  CATHETERIZATION WITH CORONARY ANGIOGRAM;  Surgeon: Micheline Chapman, MD;  Location: [facility] CATH LAB;  Service: Cardiovascular;  Laterality: N/A;  . Subxyphoid pericardial window N/A 04/02/2014    Procedure: SUBXYPHOID PERICARDIAL WINDOW;  Surgeon: Kerin Perna, MD;  Location: [facility] OR;  Service: Thoracic;  Laterality: N/A;  . Tee without cardioversion N/A 04/02/2014    Procedure: TRANSESOPHAGEAL ECHOCARDIOGRAM (TEE);  Surgeon: Kerin Perna, MD;  Location: [facility] OR;  Service: Thoracic;  Laterality: N/A;     Current Outpatient Prescriptions  Medication Sig Dispense Refill  . acetaminophen (TYLENOL) 500 MG tablet Take 500 mg by mouth every 6 (six) hours as needed.    Marland Kitchen aspirin 81 MG EC tablet Take 1 tablet (81 mg total) by mouth daily.    . carvedilol (COREG) 6.25 MG tablet Take 1 tablet (6.25 mg total) by mouth 2 (two) times daily. 180 tablet 3  . warfarin (COUMADIN) 7.5 MG tablet Take 1 tablet (7.5 mg total) by mouth as directed. 30 tablet 3   No current facility-administered medications for this visit.    Allergies:   Review of patient's allergies indicates no known allergies.    Social History:  The patient  reports that he has never smoked. He does not have any smokeless tobacco history on file. He reports that he does not drink alcohol or use illicit drugs.   Family History:  The patient's family history includes Cancer in his maternal grandmother. There is no history of Heart attack or Stroke.    ROS:  Please see the history of present illness.   Otherwise, review of systems are positive for none.   All other systems are reviewed and negative.    PHYSICAL EXAM: VS:  BP 142/80 mmHg  Pulse 78  Ht 5\' 10"  (1.778 m)  Wt 201 lb (91.173 kg)  BMI 28.84 kg/m2    Wt Readings from Last 3 Encounters:  05/23/14 201 lb (91.173 kg)  05/06/14 199 lb (90.266 kg)  04/16/14 196 lb 8 oz (89.132 kg)     GEN: Well nourished, well developed, in no acute distress HEENT: normal Neck: no JVD, no  masses Cardiac:  Normal S1/mechanical S2, RRR; no murmur, no rubs or gallops, no edema  Respiratory:  clear to auscultation bilaterally, no wheezing, rhonchi or rales. GI: soft, nontender, nondistended, + BS MS: no deformity or atrophy Skin: warm and dry  Neuro:  CNs II-XII intact, Strength and sensation are intact Psych: Normal affect   EKG:  EKG is ordered today.  It demonstrates:   NSR, HR 78, LVH, inferolateral T-wave inversions, no significant change when compared to prior tracings   Recent Labs: 02/18/2014: Pro B Natriuretic peptide (BNP) 2601.0*; TSH 2.090 02/25/2014: Magnesium 2.2 04/04/2014: ALT 19 04/05/2014: BUN <5*; Creatinine 0.66; Hemoglobin 10.1*; Platelets 242; Potassium 4.2; Sodium 138    Lipid Panel No results found for: CHOL, TRIG, HDL, CHOLHDL, VLDL, LDLCALC, LDLDIRECT    ASSESSMENT AND PLAN:  1.  Severe aortic insufficiency S/P AVR (aortic valve replacement) and aortoplasty:  Overall doing well.  Recent echo demonstrated normal functioning AVR.  He is followed in our coumadin clinic.  Continue SBE prophylaxis.  He FU with Dr.  Donata Clay next week. 2.  NICM (nonischemic cardiomyopathy):  No signs or symptoms of CHF.  He is tolerating his current dose of Coreg.  He brings in a list of BPs.  BP ranges 130/80-148/85.      -  Plan: Start lisinopril (PRINIVIL,ZESTRIL) 5 MG twice daily     -  Basic Metabolic Panel (BMET) 1 week.    -  Continue Coreg at current dose.  3.  Pericardial effusion:  S/p window.  Recent echo with trivial effusion.  FU with Dr. Donata Clay as planned.  4.  Lung nodule:  FU Chest CT pending in 09/2014.      Current medicines are reviewed at length with the patient today.  The patient does not have concerns regarding medicines.  The following changes have been made:  As above.    Labs/ tests ordered today include:  Orders Placed This Encounter  Procedures  . Basic Metabolic Panel (BMET)  . EKG 12-Lead     Disposition:   FU with Dr.  [name]  In March as planned.      Signed, Brynda Rim, MHS 05/23/2014 10:11 AM    [facility] [address] [place], [place], [place]  [address] Phone: [phone]; Fax: [phone]

## 2014-05-27 ENCOUNTER — Telehealth: Payer: Self-pay | Admitting: *Deleted

## 2014-05-28 ENCOUNTER — Ambulatory Visit: Payer: BLUE CROSS/BLUE SHIELD | Admitting: Cardiothoracic Surgery

## 2014-05-30 ENCOUNTER — Other Ambulatory Visit (INDEPENDENT_AMBULATORY_CARE_PROVIDER_SITE_OTHER): Payer: BLUE CROSS/BLUE SHIELD | Admitting: *Deleted

## 2014-05-30 DIAGNOSIS — I319 Disease of pericardium, unspecified: Secondary | ICD-10-CM

## 2014-05-30 DIAGNOSIS — I429 Cardiomyopathy, unspecified: Secondary | ICD-10-CM

## 2014-05-30 DIAGNOSIS — I428 Other cardiomyopathies: Secondary | ICD-10-CM

## 2014-05-30 DIAGNOSIS — I313 Pericardial effusion (noninflammatory): Secondary | ICD-10-CM

## 2014-05-30 DIAGNOSIS — I3139 Other pericardial effusion (noninflammatory): Secondary | ICD-10-CM

## 2014-05-30 LAB — BASIC METABOLIC PANEL
BUN: 12 mg/dL (ref 6–23)
CALCIUM: 9.5 mg/dL (ref 8.4–10.5)
CO2: 30 meq/L (ref 19–32)
CREATININE: 0.76 mg/dL (ref 0.40–1.50)
Chloride: 104 mEq/L (ref 96–112)
GFR: 116.07 mL/min (ref >60.00)
Glucose, Bld: 73 mg/dL (ref 70–99)
Potassium: 4.6 mEq/L (ref 3.5–5.1)
Sodium: 136 mEq/L (ref 135–145)

## 2014-06-04 ENCOUNTER — Encounter: Payer: Self-pay | Admitting: Cardiothoracic Surgery

## 2014-06-04 ENCOUNTER — Ambulatory Visit (INDEPENDENT_AMBULATORY_CARE_PROVIDER_SITE_OTHER): Payer: Self-pay | Admitting: Cardiothoracic Surgery

## 2014-06-04 VITALS — BP 130/88 | HR 68 | Resp 20 | Ht 70.0 in | Wt 201.0 lb

## 2014-06-04 DIAGNOSIS — I3139 Other pericardial effusion (noninflammatory): Secondary | ICD-10-CM

## 2014-06-04 DIAGNOSIS — I319 Disease of pericardium, unspecified: Secondary | ICD-10-CM

## 2014-06-04 DIAGNOSIS — I351 Nonrheumatic aortic (valve) insufficiency: Secondary | ICD-10-CM

## 2014-06-04 DIAGNOSIS — I313 Pericardial effusion (noninflammatory): Secondary | ICD-10-CM

## 2014-06-04 DIAGNOSIS — I719 Aortic aneurysm of unspecified site, without rupture: Secondary | ICD-10-CM

## 2014-06-04 NOTE — Progress Notes (Signed)
PCP is Pearla Dubonnet, MD Referring Provider is Wendall Stade, MD  Chief Complaint  Patient presents with  . Routine Post Op    4 week f/u, 2D Echo 05/20/14     HPI:49 year old male returns for 3 month followup after urgent aortic placement-Bentall procedure with mechanical aVR for severe AI with a 7 cm ascending aneurysm and EF 25%. He developed a postoperative pericardial effusion while being started on Coumadin and required a subxiphoid pericardial window. He is now doing well at home and is working and doing normal daily activities without difficulty. The INR goal is 2.2-2.5. His had no bleeding complications. He has no symptoms of CHF.  A 2-D echocardiogram performed recently shows significant improvement in his LV function EF 50% with normal functioning aVR, no AI and no pericardial effusion.   Past Medical History  Diagnosis Date  . Aortic insufficiency     a. 02/2014 - admx with sx severe AI and assoc ascending aortic aneurysm (6.8 cm) >> s/p Bentall with mechancial AVR (Dr. Donata Clay)  . Thoracic ascending aortic aneurysm     Echo 11/15: severe AI, severely dilated ascending aorta (6.5 cm);  s/p Bentall 02/2014 with aortic root replacement using a mechanical valve-conduit and reimplantation of coronary arteries (St. Jude 27 mm valve), replacement of ascending aorta to the proximal arch with a 30 mm Dacron graft  . Pericardial effusion     large pericardial effusion post Bentall procedure >> s/p pericardial window  . Hx of echocardiogram     a. Echo 11/15: EF 55%, severe AI, marked dilation of ascending aorta; b. Echo 12/15: EF 30-35%, large effusion; c. Echo 2/16: EF 40-45%, no RWMA, mechanical AVR ok, trivial effusion post to heart   . NICM (nonischemic cardiomyopathy)     a. TEE (11/15): Mild LVH, EF 45-50%, diffuse HK, prominent apical trabeculations, severe AI, severely dilated ascending aorta (6.9 cm), mild MR;  b. EF 30-35% post AVR >> c. EF 40-45% by echo 05/2014  . Hx  of cardiac catheterization     a. LHC (11/15): EF 55%, marked ascending aortic dilatation with severe AI on aortic root angiography, normal coronary arteries    Past Surgical History  Procedure Laterality Date  . Tee without cardioversion N/A 02/20/2014    Procedure: TRANSESOPHAGEAL ECHOCARDIOGRAM (TEE);  Surgeon: Lars Masson, MD;  Location: [facility] ENDOSCOPY;  Service: Cardiovascular;  Laterality: N/AZachary [name] procedure N/A 02/24/2014    Procedure: BENTALL PROCEDURE;  Surgeon: Kerin Perna, MD;  Location: [facility] OR;  Service: Open Heart Surgery;  Laterality: N/A;  . Intraoperative transesophageal echocardiogram N/A 02/24/2014    Procedure: INTRAOPERATIVE TRANSESOPHAGEAL ECHOCARDIOGRAM;  Surgeon: Kerin Perna, MD;  Location: [facility] OR;  Service: Open Heart Surgery;  Laterality: N/A;  . Left and right heart catheterization with coronary angiogram N/A 02/20/2014    Procedure: LEFT AND RIGHT HEART CATHETERIZATION WITH CORONARY ANGIOGRAM;  Surgeon: Micheline Chapman, MD;  Location: [facility] CATH LAB;  Service: Cardiovascular;  Laterality: N/A;  . Subxyphoid pericardial window N/A 04/02/2014    Procedure: SUBXYPHOID PERICARDIAL WINDOW;  Surgeon: Kerin Perna, MD;  Location: [facility] OR;  Service: Thoracic;  Laterality: N/A;  . Tee without cardioversion N/A 04/02/2014    Procedure: TRANSESOPHAGEAL ECHOCARDIOGRAM (TEE);  Surgeon: Kerin Perna, MD;  Location: [facility] OR;  Service: Thoracic;  Laterality: N/A;    Family History  Problem Relation Age of Onset  . Heart attack Neg Hx   . Stroke Neg Hx   .  Cancer Maternal Grandmother     Social History History  Substance Use Topics  . Smoking status: Never Smoker   . Smokeless tobacco: Not on file  . Alcohol Use: No    Current Outpatient Prescriptions  Medication Sig Dispense Refill  . acetaminophen (TYLENOL) 500 MG tablet Take 500 mg by mouth every 6 (six) hours as needed.    Marland Kitchen aspirin 81 MG EC tablet Take 1 tablet (81 mg total) by mouth daily.    .  carvedilol (COREG) 6.25 MG tablet Take 1 tablet (6.25 mg total) by mouth 2 (two) times daily. 180 tablet 3  . lisinopril (PRINIVIL,ZESTRIL) 5 MG tablet Take 1 tablet (5 mg total) by mouth 2 (two) times daily. 60 tablet 11  . warfarin (COUMADIN) 7.5 MG tablet Take 1 tablet (7.5 mg total) by mouth as directed. 30 tablet 3   No current facility-administered medications for this visit.    No Known Allergies  Review of Systems  Improved in appetite exercise tolerance and sleeping. Still some soreness in his shoulders bilaterally No pedal edema No neurologic symptoms  BP 130/88 mmHg  Pulse 68  Resp 20  Ht 5\' 10"  (1.778 m)  Wt 201 lb (91.173 kg)  BMI 28.84 kg/m2  SpO2 97% Physical Exam Alert and comfortable Lungs clear Heart rate regular, sharp aortic valve closure sound without murmur No peripheral edema Good hand grip bilaterally no focal motor deficit Peripheral pulses intact Abdomen soft without pulsatile mass  Diagnostic Tests: Chest x-ray today shows clear lung fields, mild elevation of left hemidiaphragm .Most recent echocardiogram personally reviewed showing significant improvement in global LV function without significant pericardial effusion, normal mechanical valve function  Impression:excellent recovery after urgent aVR for severe AI, class 4 CHF, and 7 cm ascending aneurysm. Patient was noted to have a small right upper lobe nodule paragraph daily and a followup CT scan with contrast is scheduled in June and I'll followup the patient after the scan.   Plan:return in late June 4 review of CT scan of chest Patient can proceed with regular work schedule, lift up to 30 pounds, and start outpatient cardiac rehabilitation at the hospital

## 2014-06-10 ENCOUNTER — Ambulatory Visit (INDEPENDENT_AMBULATORY_CARE_PROVIDER_SITE_OTHER): Payer: BLUE CROSS/BLUE SHIELD | Admitting: Pharmacist

## 2014-06-10 DIAGNOSIS — I719 Aortic aneurysm of unspecified site, without rupture: Secondary | ICD-10-CM

## 2014-06-10 DIAGNOSIS — Z954 Presence of other heart-valve replacement: Secondary | ICD-10-CM

## 2014-06-10 DIAGNOSIS — Z952 Presence of prosthetic heart valve: Secondary | ICD-10-CM

## 2014-06-10 DIAGNOSIS — Z5181 Encounter for therapeutic drug level monitoring: Secondary | ICD-10-CM

## 2014-06-10 LAB — POCT INR: INR: 2.1

## 2014-06-11 ENCOUNTER — Telehealth: Payer: Self-pay | Admitting: *Deleted

## 2014-06-11 NOTE — Telephone Encounter (Signed)
We do not participate in genetic testing for warfarin due to its lack of added benefit.  Will respond appropriately once fax is received.

## 2014-06-11 NOTE — Telephone Encounter (Signed)
A representative from [employer] called to verify that we received a fax for dna testing for warfarin as they have not received any response. I requested that they re-send it. Please advise. Thanks, MI

## 2014-07-02 ENCOUNTER — Ambulatory Visit (INDEPENDENT_AMBULATORY_CARE_PROVIDER_SITE_OTHER): Payer: BLUE CROSS/BLUE SHIELD | Admitting: Cardiovascular Disease

## 2014-07-02 ENCOUNTER — Encounter: Payer: Self-pay | Admitting: Cardiovascular Disease

## 2014-07-02 ENCOUNTER — Ambulatory Visit (INDEPENDENT_AMBULATORY_CARE_PROVIDER_SITE_OTHER): Payer: BLUE CROSS/BLUE SHIELD | Admitting: *Deleted

## 2014-07-02 VITALS — BP 120/86 | HR 64 | Ht 70.0 in | Wt 207.4 lb

## 2014-07-02 DIAGNOSIS — I351 Nonrheumatic aortic (valve) insufficiency: Secondary | ICD-10-CM | POA: Diagnosis not present

## 2014-07-02 DIAGNOSIS — Z954 Presence of other heart-valve replacement: Secondary | ICD-10-CM

## 2014-07-02 DIAGNOSIS — I719 Aortic aneurysm of unspecified site, without rupture: Secondary | ICD-10-CM | POA: Diagnosis not present

## 2014-07-02 DIAGNOSIS — Z952 Presence of prosthetic heart valve: Secondary | ICD-10-CM

## 2014-07-02 DIAGNOSIS — Z5181 Encounter for therapeutic drug level monitoring: Secondary | ICD-10-CM

## 2014-07-02 LAB — POCT INR: INR: 1.9

## 2014-07-02 MED ORDER — CARVEDILOL 12.5 MG PO TABS: 12.5000 mg | ORAL_TABLET | Freq: Two times a day (BID) | ORAL | Status: DC

## 2014-07-02 NOTE — Progress Notes (Signed)
Cardiology Office Note   Date:  07/04/2014   ID:  Jeffrey [name], DOB April 11, 1966, MRN [id number]  PCP:  Pearla Dubonnet, MD  Cardiologist:  [name], MD    No chief complaint on file.    History of Present Illness: Jeffrey [name] is a 49 y.o. male who presents for follow-up of aortic valve disease. He was initially hospitalized in November 2015 with congestive heart failure. The patient was diagnosed with severe aortic valve insufficiency in the setting of an ascending thoracic aortic aneurysm. He underwent Bentall procedure with aortic root replacement using a mechanical valve-conduit and coronary artery reimplantation (St. Jude 27 mm mechanical valve). His ascending aorta was replaced to the proximal arch with a 30 mm Dacron graft. A routine postoperative echo at the time of outpatient follow-up in December 2015 showed a large pericardial effusion. He underwent a subxiphoid pericardial window without complication. He had a follow-up echocardiogram in February 2016 showing improved LV function and only trivial effusion.   The patient is doing well. Notes BP's have been well-controlled at home. He's tolerating current medical therapy and reports good compliance with medicines. No chest pain, shortness of breath, dizziness. No bleeding problems on warfarin.    Past Medical History  Diagnosis Date  . Aortic insufficiency     a. 02/2014 - admx with sx severe AI and assoc ascending aortic aneurysm (6.8 cm) >> s/p Bentall with mechancial AVR (Dr. Donata Clay)  . Thoracic ascending aortic aneurysm     Echo 11/15: severe AI, severely dilated ascending aorta (6.5 cm);  s/p Bentall 02/2014 with aortic root replacement using a mechanical valve-conduit and reimplantation of coronary arteries (St. Jude 27 mm valve), replacement of ascending aorta to the proximal arch with a 30 mm Dacron graft  . Pericardial effusion     large pericardial effusion post Bentall procedure >> s/p  pericardial window  . Hx of echocardiogram     a. Echo 11/15: EF 55%, severe AI, marked dilation of ascending aorta; b. Echo 12/15: EF 30-35%, large effusion; c. Echo 2/16: EF 40-45%, no RWMA, mechanical AVR ok, trivial effusion post to heart   . NICM (nonischemic cardiomyopathy)     a. TEE (11/15): Mild LVH, EF 45-50%, diffuse HK, prominent apical trabeculations, severe AI, severely dilated ascending aorta (6.9 cm), mild MR;  b. EF 30-35% post AVR >> c. EF 40-45% by echo 05/2014  . Hx of cardiac catheterization     a. LHC (11/15): EF 55%, marked ascending aortic dilatation with severe AI on aortic root angiography, normal coronary arteries    Past Surgical History  Procedure Laterality Date  . Tee without cardioversion N/A 02/20/2014    Procedure: TRANSESOPHAGEAL ECHOCARDIOGRAM (TEE);  Surgeon: Lars Masson, MD;  Location: [facility] ENDOSCOPY;  Service: Cardiovascular;  Laterality: N/AZachary George procedure N/A 02/24/2014    Procedure: BENTALL PROCEDURE;  Surgeon: Kerin Perna, MD;  Location: [facility] OR;  Service: Open Heart Surgery;  Laterality: N/A;  . Intraoperative transesophageal echocardiogram N/A 02/24/2014    Procedure: INTRAOPERATIVE TRANSESOPHAGEAL ECHOCARDIOGRAM;  Surgeon: Kerin Perna, MD;  Location: [facility] OR;  Service: Open Heart Surgery;  Laterality: N/A;  . Left and right heart catheterization with coronary angiogram N/A 02/20/2014    Procedure: LEFT AND RIGHT HEART CATHETERIZATION WITH CORONARY ANGIOGRAM;  Surgeon: Micheline Chapman, MD;  Location: [facility] CATH LAB;  Service: Cardiovascular;  Laterality: N/A;  . Subxyphoid pericardial window N/A 04/02/2014    Procedure: SUBXYPHOID PERICARDIAL WINDOW;  Surgeon: Kathlee Nations  Maudie Flakes, MD;  Location: [facility] OR;  Service: Thoracic;  Laterality: N/A;  . Tee without cardioversion N/A 04/02/2014    Procedure: TRANSESOPHAGEAL ECHOCARDIOGRAM (TEE);  Surgeon: Kerin Perna, MD;  Location: [facility] OR;  Service: Thoracic;  Laterality: N/A;    Current  Outpatient Prescriptions  Medication Sig Dispense Refill  . acetaminophen (TYLENOL) 500 MG tablet Take 500 mg by mouth every 6 (six) hours as needed.    Marland Kitchen aspirin 81 MG EC tablet Take 1 tablet (81 mg total) by mouth daily.    . carvedilol (COREG) 12.5 MG tablet Take 1 tablet (12.5 mg total) by mouth 2 (two) times daily. 60 tablet 11  . lisinopril (PRINIVIL,ZESTRIL) 5 MG tablet Take 1 tablet (5 mg total) by mouth 2 (two) times daily. 60 tablet 11  . warfarin (COUMADIN) 7.5 MG tablet Take 1 tablet (7.5 mg total) by mouth as directed. 30 tablet 3   No current facility-administered medications for this visit.    Allergies:   Review of patient's allergies indicates no known allergies.   Social History:  The patient  reports that he has never smoked. He does not have any smokeless tobacco history on file. He reports that he does not drink alcohol or use illicit drugs.   Family History:  The patient's  family history includes Cancer in his maternal grandmother. There is no history of Heart attack or Stroke.    ROS:  Please see the history of present illness. Positive for left shoulder pain and decreased range of motion. All other systems are reviewed and negative.    PHYSICAL EXAM: VS:  BP 120/86 mmHg  Pulse 64  Ht 5\' 10"  (1.778 m)  Wt 207 lb 6.4 oz (94.076 kg)  BMI 29.76 kg/m2  SpO2 99% , BMI Body mass index is 29.76 kg/(m^2). GEN: Well nourished, well developed, in no acute distress HEENT: normal Neck: no JVD, no masses. No carotid bruits Cardiac: RRR with normal mechanical A2, no murmur                Respiratory:  clear to auscultation bilaterally, normal work of breathing GI: soft, nontender, nondistended, + BS MS: no deformity or atrophy Ext: no pretibial edema, pedal pulses 2+= bilaterally Skin: warm and dry, no rash Neuro:  Strength and sensation are intact Psych: euthymic mood, full affect  EKG:  EKG is not ordered today.  Recent Labs: 02/18/2014: Pro B Natriuretic  peptide (BNP) 2601.0*; TSH 2.090 02/25/2014: Magnesium 2.2 04/04/2014: ALT 19 04/05/2014: Hemoglobin 10.1*; Platelets 242 05/30/2014: BUN 12; Creatinine 0.76; Potassium 4.6; Sodium 136   Lipid Panel  No results found for: CHOL, TRIG, HDL, CHOLHDL, VLDL, LDLCALC, LDLDIRECT    Wt Readings from Last 3 Encounters:  07/02/14 207 lb 6.4 oz (94.076 kg)  06/04/14 201 lb (91.173 kg)  05/23/14 201 lb (91.173 kg)     Cardiac Studies Reviewed: 2D Echo 2.9.2016: Study Conclusions  - Left ventricle: The cavity size was normal. Systolic function was mildly to moderately reduced. The estimated ejection fraction was in the range of 40% to 45%. Wall motion was normal; there were no regional wall motion abnormalities. - Ventricular septum: Septal motion showed paradox. These changes are consistent with intraventricular conduction delay. - Aortic valve: A mechanical prosthesis was present and functioning normally. - Pericardium, extracardiac: A trivial pericardial effusion was identified posterior to the heart.  ASSESSMENT AND PLAN: 1.  Ascending aortic aneurysm with severe aortic insufficiency: Now status post Bentall surgery with mechanical aortic prosthesis: The patient  is doing well. He has normal function of his mechanical aortic valve. He understands SBE prophylaxis. He will see Tereso Newcomer in 3 months and I will see him back in 6 months.  2. Chronic anticoagulation: Tolerating warfarin without bleeding complications.  3. Pericardial effusion: The patient is now status post pericardial window with only trivial residual effusion. Clinically improved.  4. Nonischemic cardiomyopathy: No symptoms of heart failure. I'm going to increase his carvedilol. He will continue on a low-dose of lisinopril. We'll continue to titrate his medications as tolerated and plan on a repeat echocardiogram in about 6 months. LVEF has improved to 45%.  5. Left shoulder pain with limited range of motion:  Will refer to physical therapy.  Current medicines are reviewed with the patient today.  The patient does not have concerns regarding medicines.  The following changes have been made:  Increase carvedilol to 12.5 mg twice daily as tolerated  Labs/ tests ordered today include:   Orders Placed This Encounter  Procedures  . Ambulatory referral to Physical Therapy  . 2D Echocardiogram with contrast    Disposition:   FU Tereso Newcomer 3 months, me in 6 months  Signed, [name], MD  07/04/2014 10:47 PM    [facility] [address] [place], [place], [place]  [address] Phone: [phone]; Fax: [phone]

## 2014-07-02 NOTE — Patient Instructions (Signed)
You are okay to start Cardiac Rehabilitation  You have been referred to Physical Therapy for Left Shoulder mobility  Your physician has recommended you make the following change in your medication:  1. INCREASE Carvedilol to 12.5mg  take one by mouth twice a day  Your physician recommends that you schedule a follow-up appointment in: 3 MONTHS with Tereso Newcomer PA-C  Your physician wants you to follow-up in: 6 MONTHS with Dr [name].  You will receive a reminder letter in the mail two months in advance. If you don't receive a letter, please call our office to schedule the follow-up appointment.  Your physician has requested that you have an echocardiogram in 6 MONTHS. Echocardiography is a painless test that uses sound waves to create images of your heart. It provides your doctor with information about the size and shape of your heart and how well your heart's chambers and valves are working. This procedure takes approximately one hour. There are no restrictions for this procedure.

## 2014-07-10 ENCOUNTER — Telehealth (HOSPITAL_COMMUNITY): Payer: Self-pay | Admitting: *Deleted

## 2014-07-10 NOTE — Telephone Encounter (Signed)
Received signed order from Dr.Cooper to attend Cardiac Rehab. Message left on answering machine to please give a call back.  Contact information left for patient. Alanson Aly, BSN

## 2014-07-21 ENCOUNTER — Emergency Department (HOSPITAL_COMMUNITY): Payer: BLUE CROSS/BLUE SHIELD

## 2014-07-21 ENCOUNTER — Emergency Department (HOSPITAL_COMMUNITY)
Admission: EM | Admit: 2014-07-21 | Discharge: 2014-07-22 | Disposition: A | Discharge status: Home / Self Care | Payer: BLUE CROSS/BLUE SHIELD | Attending: Emergency Medicine | Admitting: Emergency Medicine

## 2014-07-21 ENCOUNTER — Encounter (HOSPITAL_COMMUNITY): Payer: Self-pay | Admitting: Emergency Medicine

## 2014-07-21 DIAGNOSIS — Z79899 Other long term (current) drug therapy: Secondary | ICD-10-CM | POA: Insufficient documentation

## 2014-07-21 DIAGNOSIS — R509 Fever, unspecified: Secondary | ICD-10-CM | POA: Insufficient documentation

## 2014-07-21 DIAGNOSIS — Z7901 Long term (current) use of anticoagulants: Secondary | ICD-10-CM | POA: Diagnosis not present

## 2014-07-21 DIAGNOSIS — Z8679 Personal history of other diseases of the circulatory system: Secondary | ICD-10-CM | POA: Diagnosis not present

## 2014-07-21 DIAGNOSIS — Z7982 Long term (current) use of aspirin: Secondary | ICD-10-CM | POA: Insufficient documentation

## 2014-07-21 LAB — URINALYSIS, ROUTINE W REFLEX MICROSCOPIC
BILIRUBIN URINE: NEGATIVE
Glucose, UA: NEGATIVE mg/dL
HGB URINE DIPSTICK: NEGATIVE
KETONES UR: NEGATIVE mg/dL
Leukocytes, UA: NEGATIVE
Nitrite: NEGATIVE
Protein, ur: NEGATIVE mg/dL
Specific Gravity, Urine: 1.009 (ref 1.005–1.030)
UROBILINOGEN UA: 1 mg/dL (ref 0.0–1.0)
pH: 6.5 (ref 5.0–8.0)

## 2014-07-21 LAB — CBC WITH DIFFERENTIAL/PLATELET
Basophils Absolute: 0 10*3/uL (ref 0.0–0.1)
Basophils Relative: 0 % (ref 0–1)
EOS PCT: 1 % (ref 0–5)
Eosinophils Absolute: 0.1 10*3/uL (ref 0.0–0.7)
HCT: 40.1 % (ref 39.0–52.0)
Hemoglobin: 13.1 g/dL (ref 13.0–17.0)
Lymphocytes Relative: 17 % (ref 12–46)
Lymphs Abs: 2 10*3/uL (ref 0.7–4.0)
MCH: 24.1 pg — ABNORMAL LOW (ref 26.0–34.0)
MCHC: 32.7 g/dL (ref 30.0–36.0)
MCV: 73.7 fL — AB (ref 78.0–100.0)
MONO ABS: 1.3 10*3/uL — AB (ref 0.1–1.0)
Monocytes Relative: 11 % (ref 3–12)
Neutro Abs: 8.6 10*3/uL — ABNORMAL HIGH (ref 1.7–7.7)
Neutrophils Relative %: 71 % (ref 43–77)
Platelets: 180 10*3/uL (ref 150–400)
RBC: 5.44 MIL/uL (ref 4.22–5.81)
RDW: 17.4 % — AB (ref 11.5–15.5)
WBC: 12 10*3/uL — ABNORMAL HIGH (ref 4.0–10.5)

## 2014-07-21 LAB — COMPREHENSIVE METABOLIC PANEL
ALT: 29 U/L (ref 0–53)
AST: 31 U/L (ref 0–37)
Albumin: 3.9 g/dL (ref 3.5–5.2)
Alkaline Phosphatase: 94 U/L (ref 39–117)
Anion gap: 9 (ref 5–15)
BUN: 9 mg/dL (ref 6–23)
CO2: 27 mmol/L (ref 19–32)
CREATININE: 0.79 mg/dL (ref 0.50–1.35)
Calcium: 9.3 mg/dL (ref 8.4–10.5)
Chloride: 100 mmol/L (ref 96–112)
GFR calc Af Amer: >90 mL/min (ref >90)
GFR calc non Af Amer: >90 mL/min (ref >90)
GLUCOSE: 101 mg/dL — AB (ref 70–99)
Potassium: 4.5 mmol/L (ref 3.5–5.1)
Sodium: 136 mmol/L (ref 135–145)
Total Bilirubin: 0.7 mg/dL (ref 0.3–1.2)
Total Protein: 7.9 g/dL (ref 6.0–8.3)

## 2014-07-21 LAB — I-STAT CG4 LACTIC ACID, ED: LACTIC ACID, VENOUS: 1.44 mmol/L (ref 0.5–2.0)

## 2014-07-21 LAB — PROTIME-INR
INR: 1.83 — AB (ref 0.00–1.49)
PROTHROMBIN TIME: 21.3 s — AB (ref 11.6–15.2)

## 2014-07-21 NOTE — ED Notes (Signed)
Pt states that he went to [facility] today because he had a slight fever, and they sent him here to make sure that he did not have an infection. Pt states that his coumadin level should be between 2 and 2.5.

## 2014-07-21 NOTE — ED Provider Notes (Signed)
CSN: [id number]     Arrival date & time 07/21/14  1855 History   First MD Initiated Contact with Patient 07/21/14 2147     Chief Complaint  Patient presents with  . Fever     (Consider location/radiation/quality/duration/timing/severity/associated sxs/prior Treatment) HPI Jeffrey [name] is a 49 year old male with past medical history of thoracic ascending aortic aneurysm, nonischemic cardiomyopathy, aortic insufficiency with mechanical AVR who presents the ER complaining of fever. Patient states yesterday he knows he had a low-grade temperature of 99, and took Tylenol. Patient states today he noticed a temperature of 101F [place] morning. He states he went to his PCP who noted him to be febrile there. Patient's PCP recommended further workup in the ER for this fever accompanied by patient history of AVR. Patient denies any recent illness, states he is completely asymptomatic over the past several weeks. Patient denies specifically headache, blurred vision, dizziness, weakness, lightheadedness, nasal congestion, cough, otalgia, sore throat, chest congestion, chest pain, shortness of breath, palpitations, nausea, vomiting, abdominal pain, dysuria, diarrhea.  Past Medical History  Diagnosis Date  . Aortic insufficiency     a. 02/2014 - admx with sx severe AI and assoc ascending aortic aneurysm (6.8 cm) >> s/p Bentall with mechancial AVR (Dr. Donata Clay)  . Thoracic ascending aortic aneurysm     Echo 11/15: severe AI, severely dilated ascending aorta (6.5 cm);  s/p Bentall 02/2014 with aortic root replacement using a mechanical valve-conduit and reimplantation of coronary arteries (St. Jude 27 mm valve), replacement of ascending aorta to the proximal arch with a 30 mm Dacron graft  . Pericardial effusion     large pericardial effusion post Bentall procedure >> s/p pericardial window  . Hx of echocardiogram     a. Echo 11/15: EF 55%, severe AI, marked dilation of ascending aorta; b. Echo 12/15: EF  30-35%, large effusion; c. Echo 2/16: EF 40-45%, no RWMA, mechanical AVR ok, trivial effusion post to heart   . NICM (nonischemic cardiomyopathy)     a. TEE (11/15): Mild LVH, EF 45-50%, diffuse HK, prominent apical trabeculations, severe AI, severely dilated ascending aorta (6.9 cm), mild MR;  b. EF 30-35% post AVR >> c. EF 40-45% by echo 05/2014  . Hx of cardiac catheterization     a. LHC (11/15): EF 55%, marked ascending aortic dilatation with severe AI on aortic root angiography, normal coronary arteries   Past Surgical History  Procedure Laterality Date  . Tee without cardioversion N/A 02/20/2014    Procedure: TRANSESOPHAGEAL ECHOCARDIOGRAM (TEE);  Surgeon: Lars Masson, MD;  Location: [facility] ENDOSCOPY;  Service: Cardiovascular;  Laterality: N/AZachary George procedure N/A 02/24/2014    Procedure: BENTALL PROCEDURE;  Surgeon: Kerin Perna, MD;  Location: [facility] OR;  Service: Open Heart Surgery;  Laterality: N/A;  . Intraoperative transesophageal echocardiogram N/A 02/24/2014    Procedure: INTRAOPERATIVE TRANSESOPHAGEAL ECHOCARDIOGRAM;  Surgeon: Kerin Perna, MD;  Location: [facility] OR;  Service: Open Heart Surgery;  Laterality: N/A;  . Left and right heart catheterization with coronary angiogram N/A 02/20/2014    Procedure: LEFT AND RIGHT HEART CATHETERIZATION WITH CORONARY ANGIOGRAM;  Surgeon: Micheline Chapman, MD;  Location: [facility] CATH LAB;  Service: Cardiovascular;  Laterality: N/A;  . Subxyphoid pericardial window N/A 04/02/2014    Procedure: SUBXYPHOID PERICARDIAL WINDOW;  Surgeon: Kerin Perna, MD;  Location: [facility] OR;  Service: Thoracic;  Laterality: N/A;  . Tee without cardioversion N/A 04/02/2014    Procedure: TRANSESOPHAGEAL ECHOCARDIOGRAM (TEE);  Surgeon: Kerin Perna, MD;  Location: [facility] OR;  Service: Thoracic;  Laterality: N/A;   Family History  Problem Relation Age of Onset  . Heart attack Neg Hx   . Stroke Neg Hx   . Cancer Maternal Grandmother    History  Substance Use  Topics  . Smoking status: Never Smoker   . Smokeless tobacco: Not on file  . Alcohol Use: No    Review of Systems  Constitutional: Positive for fever.  HENT: Negative for trouble swallowing.   Eyes: Negative for visual disturbance.  Respiratory: Negative for shortness of breath.   Cardiovascular: Negative for chest pain.  Gastrointestinal: Negative for nausea, vomiting and abdominal pain.  Genitourinary: Negative for dysuria.  Musculoskeletal: Negative for neck pain.  Skin: Negative for rash.  Neurological: Negative for dizziness, weakness and numbness.  Psychiatric/Behavioral: Negative.       Allergies  Review of patient's allergies indicates no known allergies.  Home Medications   Prior to Admission medications   Medication Sig Start Date End Date Taking? Authorizing Provider  acetaminophen (TYLENOL) 500 MG tablet Take 500 mg by mouth every 6 (six) hours as needed for mild pain or fever.    Yes Historical Provider, MD  aspirin 81 MG EC tablet Take 1 tablet (81 mg total) by mouth daily. 03/03/14  Yes Donielle Margaretann Loveless, PA-C  carvedilol (COREG) 12.5 MG tablet Take 1 tablet (12.5 mg total) by mouth 2 (two) times daily. 07/02/14  Yes [name], MD  lisinopril (PRINIVIL,ZESTRIL) 5 MG tablet Take 1 tablet (5 mg total) by mouth 2 (two) times daily. 05/23/14  Yes Beatrice Lecher, PA-C  warfarin (COUMADIN) 7.5 MG tablet Take 3.75-7.5 mg by mouth daily. Take 7.5mg  every day except on Mondays take 3.75mg    Yes Historical Provider, MD  warfarin (COUMADIN) 7.5 MG tablet Take 1 tablet (7.5 mg total) by mouth as directed. Patient not taking: Reported on 07/21/2014 05/06/14   [name], MD   BP 113/74 mmHg  Pulse 77  Temp(Src) 98.4 F (36.9 C)  Resp 18  Wt 208 lb 9 oz (94.603 kg)  SpO2 99% Physical Exam  Constitutional: He is oriented to person, place, and time. He appears well-developed and well-nourished. No distress.  HENT:  Head: Normocephalic and atraumatic.   Mouth/Throat: Oropharynx is clear and moist. No oropharyngeal exudate.  Eyes: Right eye exhibits no discharge. Left eye exhibits no discharge. No scleral icterus.  Neck: Normal range of motion.  Cardiovascular: Normal rate, regular rhythm and normal heart sounds.   No murmur heard. Mechanical click noted at S2 consistent with prosthetic aortic valve.  Pulmonary/Chest: Effort normal and breath sounds normal. No accessory muscle usage. No tachypnea. No respiratory distress.  Abdominal: Soft. There is no tenderness.  Musculoskeletal: Normal range of motion. He exhibits no edema or tenderness.  Neurological: He is alert and oriented to person, place, and time. No cranial nerve deficit or sensory deficit. He displays a negative Romberg sign. Coordination and gait normal. GCS eye subscore is 4. GCS verbal subscore is 5. GCS motor subscore is 6.  Patient fully alert, answering questions appropriately in full, clear sentences. Cranial nerves II through XII grossly intact. Motor strength 5 out of 5 in all major muscle groups of upper and lower extremities. Distal sensation intact.  Skin: Skin is warm and dry. No rash noted. He is not diaphoretic.  Psychiatric: He has a normal mood and affect.  Nursing note and vitals reviewed.   ED Course  Procedures (including critical care time) Labs Review Labs  Reviewed  CBC WITH DIFFERENTIAL/PLATELET - Abnormal; Notable for the following:    WBC 12.0 (*)    MCV 73.7 (*)    MCH 24.1 (*)    RDW 17.4 (*)    Neutro Abs 8.6 (*)    Monocytes Absolute 1.3 (*)    All other components within normal limits  COMPREHENSIVE METABOLIC PANEL - Abnormal; Notable for the following:    Glucose, Bld 101 (*)    All other components within normal limits  PROTIME-INR - Abnormal; Notable for the following:    Prothrombin Time 21.3 (*)    INR 1.83 (*)    All other components within normal limits  CULTURE, BLOOD (ROUTINE X 2)  CULTURE, BLOOD (ROUTINE X 2)  URINALYSIS,  ROUTINE W REFLEX MICROSCOPIC  I-STAT CG4 LACTIC ACID, ED    Imaging Review Dg Chest 2 View  07/21/2014   CLINICAL DATA:  Fever.  Valve replacement 4 months ago  EXAM: CHEST  2 VIEW  COMPARISON:  05/06/2014  FINDINGS: Mild cardiomegaly is stable. The patient is status post aortic valve replacement. Stable aortic and hilar contours.  Elevation the left diaphragm with small pleural effusion or scarring which is stable. Streaky left lower lobe opacity is also stable and likely scarring. No edema or pneumonia. No pneumothorax.  IMPRESSION: Stable exam, including atelectasis or scarring over the elevated left diaphragm. No definitive pneumonia.   Electronically Signed   By: Marnee Spring M.D.   On: 07/21/2014 23:55     EKG Interpretation None      MDM   Final diagnoses:  Fever  Intermittent fever of unknown origin    Patient here with subjective fever of 101F. Patient completely asymptomatic throughout ER stay. Patient is well-appearing with no complaints here and in no acute distress. Patient hemodynamically stable, afebrile here in the ER. With extensive workup of lab work and imaging, there is no evidence of active infectious disease. We'll send blood cultures. Patient discharged at this time and strongly encouraged to follow-up with his primary care physician. Discussed return precautions with patient, and patient verbalizes understanding and agreement of this plan. Strongly encouraged patient to follow with his PCP tomorrow regarding his subtherapeutic INR. I encouraged patient to call or return to the ER should he develop any symptoms or should he have any questions or concerns.  BP 113/74 mmHg  Pulse 77  Temp(Src) 98.4 F (36.9 C)  Resp 18  Wt 208 lb 9 oz (94.603 kg)  SpO2 99%  Signed,  Ladona Mow, PA-C 12:48 AM  Patient discussed with Dr. Gray Bernhardt, MD    Ladona Mow, PA-C 07/22/14 1610  Mancel Bale, MD 07/22/14 [phone]

## 2014-07-21 NOTE — ED Notes (Signed)
Pt here with fever and denies other complaint; pt sts has mechanical heart valve and needs to have blood work to r/o any issues

## 2014-07-22 NOTE — Discharge Instructions (Signed)

## 2014-07-23 ENCOUNTER — Encounter: Payer: Self-pay | Admitting: Physician Assistant

## 2014-07-23 ENCOUNTER — Encounter: Payer: Self-pay | Admitting: Cardiovascular Disease

## 2014-07-23 ENCOUNTER — Ambulatory Visit (INDEPENDENT_AMBULATORY_CARE_PROVIDER_SITE_OTHER): Payer: BLUE CROSS/BLUE SHIELD | Admitting: *Deleted

## 2014-07-23 DIAGNOSIS — I719 Aortic aneurysm of unspecified site, without rupture: Secondary | ICD-10-CM | POA: Diagnosis not present

## 2014-07-23 DIAGNOSIS — Z952 Presence of prosthetic heart valve: Secondary | ICD-10-CM

## 2014-07-23 DIAGNOSIS — Z5181 Encounter for therapeutic drug level monitoring: Secondary | ICD-10-CM

## 2014-07-23 DIAGNOSIS — Z954 Presence of other heart-valve replacement: Secondary | ICD-10-CM | POA: Diagnosis not present

## 2014-07-23 LAB — POCT INR: INR: 1.7

## 2014-07-24 ENCOUNTER — Telehealth: Payer: Self-pay

## 2014-07-24 ENCOUNTER — Telehealth (HOSPITAL_COMMUNITY): Payer: Self-pay | Admitting: *Deleted

## 2014-07-24 NOTE — Telephone Encounter (Signed)
I spoke with the pt by phone after multiple communications through My Chart. I made the pt aware that at this time his blood cultures are preliminary but have not shown any growth. The pt has not had a fever since yesterday and denies any other symptoms.  The pt is taking his Augmentin as prescribed by his PCP. I advised the pt that at this time we will wait for the final results of blood cultures.  I did make the pt aware that just because he had a fever does not mean that his valve is infected.  I advised him that his fever could come from a sinus infection, UTI or other bacterial or viral illness.  The pt said his PCP did say that his nose was red and inflamed upon examination.  I made the pt aware that we will wait to see the final results on blood cultures.

## 2014-07-24 NOTE — Telephone Encounter (Signed)
Unable to leave message. Mailbox is full. Will send please contact cardiac rehab to address listed in epic. Alanson Aly, BSN

## 2014-07-28 ENCOUNTER — Encounter: Payer: Self-pay | Admitting: *Deleted

## 2014-07-28 ENCOUNTER — Ambulatory Visit (INDEPENDENT_AMBULATORY_CARE_PROVIDER_SITE_OTHER): Payer: BLUE CROSS/BLUE SHIELD | Admitting: *Deleted

## 2014-07-28 VITALS — BP 110/60

## 2014-07-28 DIAGNOSIS — Z954 Presence of other heart-valve replacement: Secondary | ICD-10-CM

## 2014-07-28 DIAGNOSIS — I719 Aortic aneurysm of unspecified site, without rupture: Secondary | ICD-10-CM | POA: Diagnosis not present

## 2014-07-28 DIAGNOSIS — Z5181 Encounter for therapeutic drug level monitoring: Secondary | ICD-10-CM

## 2014-07-28 DIAGNOSIS — Z952 Presence of prosthetic heart valve: Secondary | ICD-10-CM

## 2014-07-28 LAB — CULTURE, BLOOD (ROUTINE X 2)
Culture: NO GROWTH
Culture: NO GROWTH

## 2014-07-28 LAB — POCT INR: INR: 2.5

## 2014-07-28 NOTE — Telephone Encounter (Signed)
Multiple emails and blood cx data reviewed. Cx's are negative (final result). He should finish antibiotics as prescribed and shouldn't require other evaluation or therapy unless symptoms/fever persist. thx  [name] 07/28/2014 9:39 PM

## 2014-07-29 NOTE — Telephone Encounter (Signed)
I sent this pt a message with Dr Earmon Phoenix comments through My Chart.

## 2014-07-30 ENCOUNTER — Ambulatory Visit: Payer: BLUE CROSS/BLUE SHIELD | Attending: Internal Medicine

## 2014-07-30 DIAGNOSIS — R293 Abnormal posture: Secondary | ICD-10-CM

## 2014-07-30 DIAGNOSIS — R29898 Other symptoms and signs involving the musculoskeletal system: Secondary | ICD-10-CM | POA: Diagnosis not present

## 2014-07-30 DIAGNOSIS — M79622 Pain in left upper arm: Secondary | ICD-10-CM | POA: Diagnosis present

## 2014-07-31 NOTE — Patient Instructions (Addendum)
Asked pt to begin iciing 3x/day for 10-15 min and to use pillow support with sitting to unload the shoulder joint. Pt was instructed to remove tape is at all irritating and it was ok to shower with tape and to remove in 3-4 days and inspect skin

## 2014-07-31 NOTE — Therapy (Signed)
[facility] [address] [place], [place], [address] Phone: [phone]   Fax:  [phone]  Physical Therapy Evaluation  Patient Details  Name: Jeffrey [name] MRN: [id number] Date of Birth: 12-21-1965 Referring Provider:  Marden Noble, MD  Encounter Date: 07/30/2014      PT End of Session - 07/31/14 0550    Visit Number 1   Number of Visits 16   Date for PT Re-Evaluation 09/25/14   PT Start Time 0225  Pt late   PT Stop Time 0300   PT Time Calculation (min) 35 min   Activity Tolerance Patient tolerated treatment well   Behavior During Therapy [facility] for tasks assessed/performed;Anxious      Past Medical History  Diagnosis Date  . Aortic insufficiency     a. 02/2014 - admx with sx severe AI and assoc ascending aortic aneurysm (6.8 cm) >> s/p Bentall with mechancial AVR (Dr. Donata Clay)  . Thoracic ascending aortic aneurysm     Echo 11/15: severe AI, severely dilated ascending aorta (6.5 cm);  s/p Bentall 02/2014 with aortic root replacement using a mechanical valve-conduit and reimplantation of coronary arteries (St. Jude 27 mm valve), replacement of ascending aorta to the proximal arch with a 30 mm Dacron graft  . Pericardial effusion     large pericardial effusion post Bentall procedure >> s/p pericardial window  . Hx of echocardiogram     a. Echo 11/15: EF 55%, severe AI, marked dilation of ascending aorta; b. Echo 12/15: EF 30-35%, large effusion; c. Echo 2/16: EF 40-45%, no RWMA, mechanical AVR ok, trivial effusion post to heart   . NICM (nonischemic cardiomyopathy)     a. TEE (11/15): Mild LVH, EF 45-50%, diffuse HK, prominent apical trabeculations, severe AI, severely dilated ascending aorta (6.9 cm), mild MR;  b. EF 30-35% post AVR >> c. EF 40-45% by echo 05/2014  . Hx of cardiac catheterization     a. LHC (11/15): EF 55%, marked ascending aortic dilatation with severe AI on aortic root angiography, normal coronary  arteries    Past Surgical History  Procedure Laterality Date  . Tee without cardioversion N/A 02/20/2014    Procedure: TRANSESOPHAGEAL ECHOCARDIOGRAM (TEE);  Surgeon: Lars Masson, MD;  Location: [facility] ENDOSCOPY;  Service: Cardiovascular;  Laterality: N/AZachary George procedure N/A 02/24/2014    Procedure: BENTALL PROCEDURE;  Surgeon: Kerin Perna, MD;  Location: [facility] OR;  Service: Open Heart Surgery;  Laterality: N/A;  . Intraoperative transesophageal echocardiogram N/A 02/24/2014    Procedure: INTRAOPERATIVE TRANSESOPHAGEAL ECHOCARDIOGRAM;  Surgeon: Kerin Perna, MD;  Location: [facility] OR;  Service: Open Heart Surgery;  Laterality: N/A;  . Left and right heart catheterization with coronary angiogram N/A 02/20/2014    Procedure: LEFT AND RIGHT HEART CATHETERIZATION WITH CORONARY ANGIOGRAM;  Surgeon: Micheline Chapman, MD;  Location: [facility] CATH LAB;  Service: Cardiovascular;  Laterality: N/A;  . Subxyphoid pericardial window N/A 04/02/2014    Procedure: SUBXYPHOID PERICARDIAL WINDOW;  Surgeon: Kerin Perna, MD;  Location: [facility] OR;  Service: Thoracic;  Laterality: N/A;  . Tee without cardioversion N/A 04/02/2014    Procedure: TRANSESOPHAGEAL ECHOCARDIOGRAM (TEE);  Surgeon: Kerin Perna, MD;  Location: [facility] OR;  Service: Thoracic;  Laterality: N/A;    There were no vitals filed for this visit.  Visit Diagnosis:  Pain of left upper arm - Plan: PT plan of care cert/re-cert  Weakness of left arm - Plan: PT plan of care cert/re-cert  Abnormal posture - Plan:  PT plan of care cert/re-cert      Subjective Assessment - 07/30/14 1642    Subjective LT shoulder pain started after having open heart surgery for valve replacement.    Pertinent History No pain prior to surgery .    Limitations Lifting;House hold activities  self care   How long can you sit comfortably? As needed   How long can you stand comfortably? As needed   How long can you walk comfortably? As needed   Diagnostic tests None    Patient Stated Goals Decreae pain improve use of LT arm   Currently in Pain? Yes   Pain Score --  No score given   Pain Location Shoulder   Pain Orientation Left   Pain Type Chronic pain   Pain Onset More than a month ago   Pain Frequency Constant   Aggravating Factors  using Lt arm   Pain Relieving Factors Rest    Effect of Pain on Daily Activities makes self care difficult and cannot lift carry etc   Multiple Pain Sites No            [facility] PT Assessment - 07/31/14 0542    Assessment   Medical Diagnosis LT shoulder pain   Onset Date 04/03/15   Precautions   Precaution Comments limmited lifting and activity .    Restrictions   Weight Bearing Restrictions No   Balance Screen   Has the patient fallen in the past 6 months No   Has the patient had a decrease in activity level because of a fear of falling?  No   Is the patient reluctant to leave their home because of a fear of falling?  No   Prior Function   Level of Independence Independent with basic ADLs   Cognition   Overall Cognitive Status Within Functional Limits for tasks assessed   Posture/Postural Control   Posture Comments Rounded shoulders   ROM / Strength   AROM / PROM / Strength AROM;PROM;Strength   AROM   AROM Assessment Site Shoulder   Right/Left Shoulder Right;Left   Right Shoulder Extension 55 Degrees   Right Shoulder Flexion 150 Degrees   Right Shoulder ABduction 150 Degrees   Right Shoulder Internal Rotation 15 Degrees   Right Shoulder External Rotation 90 Degrees   Left Shoulder Extension 42 Degrees   Left Shoulder Flexion 70 Degrees   Left Shoulder ABduction 65 Degrees   Left Shoulder Internal Rotation 15 Degrees   Left Shoulder External Rotation 55 Degrees   PROM   PROM Assessment Site Shoulder   Right/Left Shoulder Left   Left Shoulder Flexion 150 Degrees   Left Shoulder ABduction 150 Degrees   Left Shoulder Internal Rotation 20 Degrees   Left Shoulder External Rotation 90 Degrees    Strength   Strength Assessment Site Shoulder   Right/Left Shoulder Left   Left Shoulder Flexion 4/5  with pain   Left Shoulder Extension 5/5   Left Shoulder ABduction 4/5  with pain   Left Shoulder Internal Rotation 5/5   Left Shoulder External Rotation 4/5  with pain   Ambulation/Gait   Gait Comments Normal                   [facility] Adult PT Treatment/Exercise - 07/31/14 0542    Manual Therapy   Manual Therapy Other (comment)   Other Manual Therapy Kineseotape applied with y to deltoid ant/post I tape suprapinatus and trap and I tape across acromium  PT Education - 07/31/14 0549    Education provided Yes   Education Details POC, icing, posture   Person(s) Educated Patient   Methods Explanation;Demonstration   Comprehension Verbalized understanding;Returned demonstration          PT Short Term Goals - 07/31/14 0553    PT SHORT TERM GOAL #1   Title Hewill be able to perform inital HEP correctly   Time 4   Period Weeks   Status New   PT SHORT TERM GOAL #2   Title He will report pain decreased 40% or more with use of LT arm.    Time 4   Period Weeks   Status New   PT SHORT TERM GOAL #3   Title He will demo 4+/5 strength in LT shoulder and be able to raise overhead.    Time 4   Period Weeks   Status New   PT SHORT TERM GOAL #4   Title He will report greater ease with showering and dressing.    Time 4   Period Weeks   Status New           PT Long Term Goals - 07/31/14 0555    PT LONG TERM GOAL #1   Title He will be independent with all HEP issued as of last visit   Time 8   Period Weeks   Status New   PT LONG TERM GOAL #2   Title He will report pain decreased 75% or more with use of LT arm for self care and normal home tasks   Time 8   Period Weeks   Status New   PT LONG TERM GOAL #3   Title He will demo equal range active of LT shoulder to RT for normal use of LT arm   Time 8   Period Weeks   Status New   PT LONG  TERM GOAL #4   Title He will report no problems with self care and home tasks.    Time 8   Period Weeks   Status New               Plan - 07/31/14 0551    Clinical Impression Statement He appears to have some tendonitis as his range is normal passively and can give good resistance but has significant pain. He should do well with PT   Pt will benefit from skilled therapeutic intervention in order to improve on the following deficits Pain;Decreased activity tolerance;Decreased strength;Postural dysfunction;Impaired UE functional use   Rehab Potential Good   PT Frequency 2x / week   PT Duration 8 weeks   PT Treatment/Interventions ADLs/Self Care Home Management;Therapeutic exercise;Patient/family education;Moist Heat;Cryotherapy;Ultrasound;Passive range of motion;Manual techniques   PT Next Visit Plan --  Modalities forpain , HEP for range, possible isometrics for strenght   PT Home Exercise Plan Active assit range , ice   Consulted and Agree with Plan of Care Patient         Problem List Patient Active Problem List   Diagnosis Date Noted  . Pericardial effusion 04/02/2014  . S/P AVR (aortic valve replacement) and aortoplasty 03/24/2014  . AI (aortic insufficiency)   . Severe aortic insufficiency 02/24/2014  . NICM (nonischemic cardiomyopathy)   . Aortic aneurysm 02/20/2014  . Aortic regurgitation 02/20/2014  . Dyspnea 02/18/2014    Caprice Red PT 07/31/2014, 6:05 AM  [facility] [address] [place], [place], [address] Phone: [phone]   Fax:  [phone]

## 2014-08-06 ENCOUNTER — Ambulatory Visit (INDEPENDENT_AMBULATORY_CARE_PROVIDER_SITE_OTHER): Payer: BLUE CROSS/BLUE SHIELD | Admitting: *Deleted

## 2014-08-06 DIAGNOSIS — Z954 Presence of other heart-valve replacement: Secondary | ICD-10-CM

## 2014-08-06 DIAGNOSIS — I719 Aortic aneurysm of unspecified site, without rupture: Secondary | ICD-10-CM

## 2014-08-06 DIAGNOSIS — Z5181 Encounter for therapeutic drug level monitoring: Secondary | ICD-10-CM | POA: Diagnosis not present

## 2014-08-06 DIAGNOSIS — Z952 Presence of prosthetic heart valve: Secondary | ICD-10-CM

## 2014-08-06 LAB — POCT INR: INR: 2.3

## 2014-08-12 ENCOUNTER — Ambulatory Visit: Payer: BLUE CROSS/BLUE SHIELD | Attending: Internal Medicine

## 2014-08-12 ENCOUNTER — Telehealth (HOSPITAL_COMMUNITY): Payer: Self-pay | Admitting: *Deleted

## 2014-08-12 DIAGNOSIS — M79622 Pain in left upper arm: Secondary | ICD-10-CM

## 2014-08-12 DIAGNOSIS — R29898 Other symptoms and signs involving the musculoskeletal system: Secondary | ICD-10-CM | POA: Diagnosis not present

## 2014-08-12 DIAGNOSIS — R293 Abnormal posture: Secondary | ICD-10-CM | POA: Insufficient documentation

## 2014-08-12 NOTE — Therapy (Signed)
[facility] [address] [place], [place], [address] Phone: [phone]   Fax:  [phone]  Physical Therapy Treatment  Patient Details  Name: Jeffrey [name] MRN: [id number] Date of Birth: 22-Mar-1966 Referring Provider:  Marden Noble, MD  Encounter Date: 08/12/2014      PT End of Session - 08/12/14 1458    Visit Number 2   Number of Visits 16   Date for PT Re-Evaluation 09/25/14   PT Start Time 0215   PT Stop Time 0305   PT Time Calculation (min) 50 min      Past Medical History  Diagnosis Date  . Aortic insufficiency     a. 02/2014 - admx with sx severe AI and assoc ascending aortic aneurysm (6.8 cm) >> s/p Bentall with mechancial AVR (Dr. Donata Clay)  . Thoracic ascending aortic aneurysm     Echo 11/15: severe AI, severely dilated ascending aorta (6.5 cm);  s/p Bentall 02/2014 with aortic root replacement using a mechanical valve-conduit and reimplantation of coronary arteries (St. Jude 27 mm valve), replacement of ascending aorta to the proximal arch with a 30 mm Dacron graft  . Pericardial effusion     large pericardial effusion post Bentall procedure >> s/p pericardial window  . Hx of echocardiogram     a. Echo 11/15: EF 55%, severe AI, marked dilation of ascending aorta; b. Echo 12/15: EF 30-35%, large effusion; c. Echo 2/16: EF 40-45%, no RWMA, mechanical AVR ok, trivial effusion post to heart   . NICM (nonischemic cardiomyopathy)     a. TEE (11/15): Mild LVH, EF 45-50%, diffuse HK, prominent apical trabeculations, severe AI, severely dilated ascending aorta (6.9 cm), mild MR;  b. EF 30-35% post AVR >> c. EF 40-45% by echo 05/2014  . Hx of cardiac catheterization     a. LHC (11/15): EF 55%, marked ascending aortic dilatation with severe AI on aortic root angiography, normal coronary arteries    Past Surgical History  Procedure Laterality Date  . Tee without cardioversion N/A 02/20/2014    Procedure:  TRANSESOPHAGEAL ECHOCARDIOGRAM (TEE);  Surgeon: Lars Masson, MD;  Location: [facility] ENDOSCOPY;  Service: Cardiovascular;  Laterality: N/AZachary George procedure N/A 02/24/2014    Procedure: BENTALL PROCEDURE;  Surgeon: Kerin Perna, MD;  Location: [facility] OR;  Service: Open Heart Surgery;  Laterality: N/A;  . Intraoperative transesophageal echocardiogram N/A 02/24/2014    Procedure: INTRAOPERATIVE TRANSESOPHAGEAL ECHOCARDIOGRAM;  Surgeon: Kerin Perna, MD;  Location: [facility] OR;  Service: Open Heart Surgery;  Laterality: N/A;  . Left and right heart catheterization with coronary angiogram N/A 02/20/2014    Procedure: LEFT AND RIGHT HEART CATHETERIZATION WITH CORONARY ANGIOGRAM;  Surgeon: Micheline Chapman, MD;  Location: [facility] CATH LAB;  Service: Cardiovascular;  Laterality: N/A;  . Subxyphoid pericardial window N/A 04/02/2014    Procedure: SUBXYPHOID PERICARDIAL WINDOW;  Surgeon: Kerin Perna, MD;  Location: [facility] OR;  Service: Thoracic;  Laterality: N/A;  . Tee without cardioversion N/A 04/02/2014    Procedure: TRANSESOPHAGEAL ECHOCARDIOGRAM (TEE);  Surgeon: Kerin Perna, MD;  Location: [facility] OR;  Service: Thoracic;  Laterality: N/A;    There were no vitals filed for this visit.  Visit Diagnosis:  Pain of left upper arm  Weakness of left arm      Subjective Assessment - 08/12/14 1501    Subjective Have been icing and doing some exercise and massage   Pertinent History No pain prior to surgery .    Limitations Lifting;House hold  activities   How long can you sit comfortably? As needed   Patient Stated Goals Decrease pain improve use of LT arm   Currently in Pain? Yes   Pain Score --  mild at rest   Multiple Pain Sites No            [facility] PT Assessment - 08/12/14 1454    AROM   Left Shoulder Flexion 95 Degrees   Left Shoulder External Rotation 75 Degrees                     [facility] Adult PT Treatment/Exercise - 08/12/14 1427    Neck Exercises: Machines for Strengthening    UBE (Upper Arm Bike) 120 RPM 5 min   Shoulder Exercises: Supine   ABduction AROM;Left;15 reps  UE ranger    Shoulder Exercises: Sidelying   Flexion AROM;Left;15 reps  UE ranger   Other Sidelying Exercises UE ranger with scapula protraction and retraction x 15 with tactile cues.    Other Sidelying Exercises arm opening with hor abduciton x 12 with cues initially.    Shoulder Exercises: Standing   Flexion AROM;Left;15 reps  UE ranger   Shoulder Exercises: Pulleys   Flexion 2 minutes   Modalities   Modalities Cryotherapy   Cryotherapy   Number Minutes Cryotherapy 10 Minutes   Cryotherapy Location Shoulder  LT   Type of Cryotherapy Ice pack   Manual Therapy   Manual Therapy Massage;Joint mobilization;Passive ROM   Joint Mobilization inf and psot glides gr 3 x 15   Massage STE to anterior and psoterior shoulder   Passive ROM flexion and ext rotation                  PT Short Term Goals - 07/31/14 0553    PT SHORT TERM GOAL #1   Title Hewill be able to perform inital HEP correctly   Time 4   Period Weeks   Status New   PT SHORT TERM GOAL #2   Title He will report pain decreased 40% or more with use of LT arm.    Time 4   Period Weeks   Status New   PT SHORT TERM GOAL #3   Title He will demo 4+/5 strength in LT shoulder and be able to raise overhead.    Time 4   Period Weeks   Status New   PT SHORT TERM GOAL #4   Title He will report greater ease with showering and dressing.    Time 4   Period Weeks   Status New           PT Long Term Goals - 07/31/14 0555    PT LONG TERM GOAL #1   Title He will be independent with all HEP issued as of last visit   Time 8   Period Weeks   Status New   PT LONG TERM GOAL #2   Title He will report pain decreased 75% or more with use of LT arm for self care and normal home tasks   Time 8   Period Weeks   Status New   PT LONG TERM GOAL #3   Title He will demo equal range active of LT shoulder to RT for normal use  of LT arm   Time 8   Period Weeks   Status New   PT LONG TERM GOAL #4   Title He will report no problems with self care and home tasks.    Time  8   Period Weeks   Status New               Plan - 08/12/14 1501    Clinical Impression Statement He has less pain and increased active motion though still limited.    PT Next Visit Plan start band or isometric exercises for HEP and continue range and strength, cold   Consulted and Agree with Plan of Care Patient        Problem List Patient Active Problem List   Diagnosis Date Noted  . Pericardial effusion 04/02/2014  . S/P AVR (aortic valve replacement) and aortoplasty 03/24/2014  . AI (aortic insufficiency)   . Severe aortic insufficiency 02/24/2014  . NICM (nonischemic cardiomyopathy)   . Aortic aneurysm 02/20/2014  . Aortic regurgitation 02/20/2014  . Dyspnea 02/18/2014    Caprice Red PT 08/12/2014, 3:06 PM  [facility] [address] [place], [place], [address] Phone: [phone]   Fax:  [phone]

## 2014-08-14 ENCOUNTER — Encounter: Payer: BLUE CROSS/BLUE SHIELD | Admitting: Physical Therapy

## 2014-08-14 ENCOUNTER — Ambulatory Visit (HOSPITAL_COMMUNITY): Payer: BLUE CROSS/BLUE SHIELD

## 2014-08-18 ENCOUNTER — Ambulatory Visit (HOSPITAL_COMMUNITY): Payer: BLUE CROSS/BLUE SHIELD

## 2014-08-19 ENCOUNTER — Encounter: Payer: Self-pay | Admitting: Cardiovascular Disease

## 2014-08-19 ENCOUNTER — Other Ambulatory Visit: Payer: Self-pay

## 2014-08-19 DIAGNOSIS — R911 Solitary pulmonary nodule: Secondary | ICD-10-CM

## 2014-08-19 DIAGNOSIS — Z9889 Other specified postprocedural states: Secondary | ICD-10-CM

## 2014-08-19 NOTE — Telephone Encounter (Signed)
Follow Up        Pt calling stating that he is returning Lauren's phone call and wanting to know why he needs to schedule an appt w/ Dr. Alexander Mt. Please call back and advise.

## 2014-08-19 NOTE — Telephone Encounter (Signed)
This encounter was created in error - please disregard.

## 2014-08-20 ENCOUNTER — Ambulatory Visit (HOSPITAL_COMMUNITY): Payer: BLUE CROSS/BLUE SHIELD

## 2014-08-21 ENCOUNTER — Ambulatory Visit: Payer: BLUE CROSS/BLUE SHIELD | Admitting: Physical Therapy

## 2014-08-21 ENCOUNTER — Encounter: Payer: BLUE CROSS/BLUE SHIELD | Admitting: Physical Therapy

## 2014-08-22 ENCOUNTER — Ambulatory Visit (HOSPITAL_COMMUNITY): Payer: BLUE CROSS/BLUE SHIELD

## 2014-08-25 ENCOUNTER — Encounter: Payer: BLUE CROSS/BLUE SHIELD | Admitting: Physical Therapy

## 2014-08-25 ENCOUNTER — Ambulatory Visit (HOSPITAL_COMMUNITY): Payer: BLUE CROSS/BLUE SHIELD

## 2014-08-27 ENCOUNTER — Ambulatory Visit (INDEPENDENT_AMBULATORY_CARE_PROVIDER_SITE_OTHER): Payer: BLUE CROSS/BLUE SHIELD | Admitting: *Deleted

## 2014-08-27 ENCOUNTER — Ambulatory Visit (HOSPITAL_COMMUNITY): Payer: BLUE CROSS/BLUE SHIELD

## 2014-08-27 DIAGNOSIS — Z952 Presence of prosthetic heart valve: Secondary | ICD-10-CM

## 2014-08-27 DIAGNOSIS — Z5181 Encounter for therapeutic drug level monitoring: Secondary | ICD-10-CM

## 2014-08-27 DIAGNOSIS — Z954 Presence of other heart-valve replacement: Secondary | ICD-10-CM

## 2014-08-27 DIAGNOSIS — I719 Aortic aneurysm of unspecified site, without rupture: Secondary | ICD-10-CM

## 2014-08-27 LAB — POCT INR: INR: 3.1

## 2014-08-28 ENCOUNTER — Encounter: Payer: BLUE CROSS/BLUE SHIELD | Admitting: Physical Therapy

## 2014-08-29 ENCOUNTER — Ambulatory Visit (HOSPITAL_COMMUNITY): Payer: BLUE CROSS/BLUE SHIELD

## 2014-09-01 ENCOUNTER — Ambulatory Visit (HOSPITAL_COMMUNITY): Payer: BLUE CROSS/BLUE SHIELD

## 2014-09-03 ENCOUNTER — Ambulatory Visit (HOSPITAL_COMMUNITY): Payer: BLUE CROSS/BLUE SHIELD

## 2014-09-04 ENCOUNTER — Ambulatory Visit: Payer: BLUE CROSS/BLUE SHIELD

## 2014-09-04 ENCOUNTER — Encounter: Payer: BLUE CROSS/BLUE SHIELD | Admitting: Physical Therapy

## 2014-09-04 DIAGNOSIS — M79622 Pain in left upper arm: Secondary | ICD-10-CM

## 2014-09-04 DIAGNOSIS — R29898 Other symptoms and signs involving the musculoskeletal system: Secondary | ICD-10-CM

## 2014-09-04 NOTE — Therapy (Signed)
[facility] Outpatient Rehabilitation [facility] [address] [place], [place], [address] Phone: [phone]   Fax:  [phone]  Physical Therapy Treatment  Patient Details  Name: Jeffrey [name] MRN: [id number] Date of Birth: 1965-10-15 Referring Provider:  Marden Noble, MD  Encounter Date: 09/04/2014      PT End of Session - 09/04/14 1500    Visit Number 3   Number of Visits 16   Date for PT Re-Evaluation 09/25/14   PT Start Time 0215   PT Stop Time 0258   PT Time Calculation (min) 43 min   Activity Tolerance Patient tolerated treatment well   Behavior During Therapy [facility] for tasks assessed/performed      Past Medical History  Diagnosis Date  . Aortic insufficiency     a. 02/2014 - admx with sx severe AI and assoc ascending aortic aneurysm (6.8 cm) >> s/p Bentall with mechancial AVR (Dr. Donata Clay)  . Thoracic ascending aortic aneurysm     Echo 11/15: severe AI, severely dilated ascending aorta (6.5 cm);  s/p Bentall 02/2014 with aortic root replacement using a mechanical valve-conduit and reimplantation of coronary arteries (St. Jude 27 mm valve), replacement of ascending aorta to the proximal arch with a 30 mm Dacron graft  . Pericardial effusion     large pericardial effusion post Bentall procedure >> s/p pericardial window  . Hx of echocardiogram     a. Echo 11/15: EF 55%, severe AI, marked dilation of ascending aorta; b. Echo 12/15: EF 30-35%, large effusion; c. Echo 2/16: EF 40-45%, no RWMA, mechanical AVR ok, trivial effusion post to heart   . NICM (nonischemic cardiomyopathy)     a. TEE (11/15): Mild LVH, EF 45-50%, diffuse HK, prominent apical trabeculations, severe AI, severely dilated ascending aorta (6.9 cm), mild MR;  b. EF 30-35% post AVR >> c. EF 40-45% by echo 05/2014  . Hx of cardiac catheterization     a. LHC (11/15): EF 55%, marked ascending aortic dilatation with severe AI on aortic root angiography, normal coronary arteries    Past  Surgical History  Procedure Laterality Date  . Tee without cardioversion N/A 02/20/2014    Procedure: TRANSESOPHAGEAL ECHOCARDIOGRAM (TEE);  Surgeon: Lars Masson, MD;  Location: [facility] ENDOSCOPY;  Service: Cardiovascular;  Laterality: N/AZachary George procedure N/A 02/24/2014    Procedure: BENTALL PROCEDURE;  Surgeon: Kerin Perna, MD;  Location: [facility] OR;  Service: Open Heart Surgery;  Laterality: N/A;  . Intraoperative transesophageal echocardiogram N/A 02/24/2014    Procedure: INTRAOPERATIVE TRANSESOPHAGEAL ECHOCARDIOGRAM;  Surgeon: Kerin Perna, MD;  Location: [facility] OR;  Service: Open Heart Surgery;  Laterality: N/A;  . Left and right heart catheterization with coronary angiogram N/A 02/20/2014    Procedure: LEFT AND RIGHT HEART CATHETERIZATION WITH CORONARY ANGIOGRAM;  Surgeon: Micheline Chapman, MD;  Location: [facility] CATH LAB;  Service: Cardiovascular;  Laterality: N/A;  . Subxyphoid pericardial window N/A 04/02/2014    Procedure: SUBXYPHOID PERICARDIAL WINDOW;  Surgeon: Kerin Perna, MD;  Location: [facility] OR;  Service: Thoracic;  Laterality: N/A;  . Tee without cardioversion N/A 04/02/2014    Procedure: TRANSESOPHAGEAL ECHOCARDIOGRAM (TEE);  Surgeon: Kerin Perna, MD;  Location: [facility] OR;  Service: Thoracic;  Laterality: N/A;    There were no vitals filed for this visit.  Visit Diagnosis:  Pain of left upper arm  Weakness of left arm      Subjective Assessment - 09/04/14 1416    Subjective Shoulder feeling better . Want to bring it  back to pre surgery level. I had some mild pain prior to surgery   Currently in Pain? Yes   Pain Score --  Mild at rest   Pain Location Shoulder   Pain Orientation Left   Pain Descriptors / Indicators Aching   Pain Type Chronic pain   Pain Onset More than a month ago   Pain Frequency Constant   Aggravating Factors  using arm   Pain Relieving Factors rest   Multiple Pain Sites No            [facility] PT Assessment - 09/04/14 1447    AROM   Left  Shoulder Flexion 95 Degrees   Left Shoulder ABduction 70 Degrees   Left Shoulder Internal Rotation 32 Degrees   Left Shoulder External Rotation 70 Degrees                     [facility] Adult PT Treatment/Exercise - 09/04/14 1418    Neck Exercises: Machines for Strengthening   UBE (Upper Arm Bike) L2 scifit 3 min forward , 3 mn back   Shoulder Exercises: Prone   Retraction AROM;Left;12 reps   Extension AROM;Left;12 reps   Horizontal ABduction 1 AROM;Left;12 reps   Shoulder Exercises: Sidelying   External Rotation AROM;Left;20 reps   External Rotation Limitations gentle manula resistance    ABduction AROM;Left;20 reps   Shoulder Exercises: Standing   Other Standing Exercises act assist and placement with holding at different positions. He is able to hold except at about 60 degrees where hw dros  and elevates his shoulder attempting to hold.    Manual Therapy   Joint Mobilization inf and post glides gr 3 x 15   Soft tissue mobilization STW to anterior and psoterior shoulder   Passive ROM flexion and ext rotation                PT Education - 09/04/14 1503    Education provided Yes   Education Details band and prone exercises   Person(s) Educated Patient   Methods Explanation;Demonstration;Tactile cues;Verbal cues;Handout   Comprehension Returned demonstration          PT Short Term Goals - 09/04/14 1504    PT SHORT TERM GOAL #1   Title Hewill be able to perform inital HEP correctly   Status Achieved   PT SHORT TERM GOAL #2   Title He will report pain decreased 40% or more with use of LT arm.    Status Achieved   PT SHORT TERM GOAL #4   Title He will report greater ease with showering and dressing.    Status On-going           PT Long Term Goals - 07/31/14 0555    PT LONG TERM GOAL #1   Title He will be independent with all HEP issued as of last visit   Time 8   Period Weeks   Status New   PT LONG TERM GOAL #2   Title He will report pain  decreased 75% or more with use of LT arm for self care and normal home tasks   Time 8   Period Weeks   Status New   PT LONG TERM GOAL #3   Title He will demo equal range active of LT shoulder to RT for normal use of LT arm   Time 8   Period Weeks   Status New   PT LONG TERM GOAL #4   Title He will report no problems with  self care and home tasks.    Time 8   Period Weeks   Status New               Plan - 09/04/14 1501    Clinical Impression Statement Active range inmproved but cont to be seak especialy abduciton Cont wiht pain with passive motion but he goe farther and has less pain.   PT Next Visit Plan Continue strength and stretcing LT shoulder   PT Home Exercise Plan [place] and [place]   Consulted and Agree with Plan of Care Patient        Problem List Patient Active Problem List   Diagnosis Date Noted  . Pericardial effusion 04/02/2014  . S/P AVR (aortic valve replacement) and aortoplasty 03/24/2014  . AI (aortic insufficiency)   . Severe aortic insufficiency 02/24/2014  . NICM (nonischemic cardiomyopathy)   . Aortic aneurysm 02/20/2014  . Aortic regurgitation 02/20/2014  . Dyspnea 02/18/2014    Caprice Red PT 09/04/2014, 3:05 PM  [facility] Outpatient Rehabilitation [facility] [address] [place], [place], [address] Phone: [phone]   Fax:  [phone]

## 2014-09-04 NOTE — Patient Instructions (Signed)
Issued from cabinet [place] and [place] with hor abduction and extension. Band red for [place] and he will do 1-2 x/day and 10-20 reps

## 2014-09-05 ENCOUNTER — Ambulatory Visit (HOSPITAL_COMMUNITY): Payer: BLUE CROSS/BLUE SHIELD

## 2014-09-10 ENCOUNTER — Ambulatory Visit (HOSPITAL_COMMUNITY): Payer: BLUE CROSS/BLUE SHIELD

## 2014-09-11 ENCOUNTER — Other Ambulatory Visit: Payer: Self-pay | Admitting: *Deleted

## 2014-09-11 ENCOUNTER — Encounter (HOSPITAL_COMMUNITY)
Admission: RE | Admit: 2014-09-11 | Discharge: 2014-09-11 | Disposition: A | Discharge status: Home / Self Care | Payer: BLUE CROSS/BLUE SHIELD | Source: Ambulatory Visit | Attending: Cardiovascular Disease | Admitting: Cardiovascular Disease

## 2014-09-11 DIAGNOSIS — Z48812 Encounter for surgical aftercare following surgery on the circulatory system: Secondary | ICD-10-CM | POA: Insufficient documentation

## 2014-09-11 DIAGNOSIS — Z952 Presence of prosthetic heart valve: Secondary | ICD-10-CM | POA: Insufficient documentation

## 2014-09-11 MED ORDER — WARFARIN SODIUM 7.5 MG PO TABS: 7.5000 mg | ORAL_TABLET | ORAL | Status: DC

## 2014-09-11 NOTE — Progress Notes (Signed)
Cardiac Rehab Medication Review by a Pharmacist  Does the patient  feel that his/her medications are working for him/her?  yes  Has the patient been experiencing any side effects to the medications prescribed?  no  Does the patient measure his/her own blood pressure or blood glucose at home?  Yes. He reports that it runs around 120 to 130 systolic.  Does the patient have any problems obtaining medications due to transportation or finances?   no  Understanding of regimen: excellent Understanding of indications: excellent Potential of compliance: excellent    Pharmacist comments: Patient has an excellent understanding of his regimen.  His blood pressure appears to be stable on the regimen and he does not complain of side effects.    Russ Halo, PharmD Clinical Pharmacist - Resident Pager: [phone] 6/2/20168:31 AM

## 2014-09-12 ENCOUNTER — Ambulatory Visit (HOSPITAL_COMMUNITY): Payer: BLUE CROSS/BLUE SHIELD

## 2014-09-15 ENCOUNTER — Encounter (HOSPITAL_COMMUNITY)
Admission: RE | Admit: 2014-09-15 | Discharge: 2014-09-15 | Disposition: A | Discharge status: Home / Self Care | Payer: BLUE CROSS/BLUE SHIELD | Source: Ambulatory Visit | Attending: Cardiovascular Disease | Admitting: Cardiovascular Disease

## 2014-09-15 ENCOUNTER — Encounter (HOSPITAL_COMMUNITY): Payer: BLUE CROSS/BLUE SHIELD

## 2014-09-15 ENCOUNTER — Ambulatory Visit (HOSPITAL_COMMUNITY): Payer: BLUE CROSS/BLUE SHIELD

## 2014-09-15 DIAGNOSIS — Z952 Presence of prosthetic heart valve: Secondary | ICD-10-CM | POA: Diagnosis not present

## 2014-09-15 DIAGNOSIS — Z48812 Encounter for surgical aftercare following surgery on the circulatory system: Secondary | ICD-10-CM | POA: Diagnosis present

## 2014-09-15 NOTE — Progress Notes (Signed)
Pt started cardiac rehab today.  Pt tolerated light exercise without difficulty. VSS, telemetry-rhythm Sinus, asymptomatic.  Medication list reconciled.  Pt verbalized compliance with medications and denies barriers to compliance. PSYCHOSOCIAL ASSESSMENT:  PHQ-0. Pt exhibits positive coping skills, hopeful outlook with supportive family. No psychosocial needs identified at this time, no psychosocial interventions necessary.    Pt enjoys traveling.   Pt cardiac rehab  goal is  to improve his shoulder mobility to 80%.  Pt encouraged to participate in exercising on your own and functional fitness to increase ability to achieve these goals.   Pt long term cardiac rehab goal is increase his strength.  Pt oriented to exercise equipment and routine.  Understanding verbalized. Jeffrey [name] just completed some physical therapy sessions for his left shoulder. No complaints with exercise today. Will continue to monitor the patient throughout  the program.

## 2014-09-17 ENCOUNTER — Ambulatory Visit (HOSPITAL_COMMUNITY): Payer: BLUE CROSS/BLUE SHIELD

## 2014-09-17 ENCOUNTER — Ambulatory Visit (INDEPENDENT_AMBULATORY_CARE_PROVIDER_SITE_OTHER): Payer: BLUE CROSS/BLUE SHIELD | Admitting: *Deleted

## 2014-09-17 ENCOUNTER — Encounter (HOSPITAL_COMMUNITY)
Admission: RE | Admit: 2014-09-17 | Discharge: 2014-09-17 | Disposition: A | Discharge status: Home / Self Care | Payer: BLUE CROSS/BLUE SHIELD | Source: Ambulatory Visit | Attending: Cardiovascular Disease | Admitting: Cardiovascular Disease

## 2014-09-17 ENCOUNTER — Encounter (HOSPITAL_COMMUNITY): Payer: BLUE CROSS/BLUE SHIELD

## 2014-09-17 ENCOUNTER — Encounter: Payer: Self-pay | Admitting: Cardiovascular Disease

## 2014-09-17 DIAGNOSIS — Z952 Presence of prosthetic heart valve: Secondary | ICD-10-CM

## 2014-09-17 DIAGNOSIS — Z5181 Encounter for therapeutic drug level monitoring: Secondary | ICD-10-CM | POA: Diagnosis not present

## 2014-09-17 DIAGNOSIS — Z954 Presence of other heart-valve replacement: Secondary | ICD-10-CM

## 2014-09-17 DIAGNOSIS — Z48812 Encounter for surgical aftercare following surgery on the circulatory system: Secondary | ICD-10-CM | POA: Diagnosis not present

## 2014-09-17 DIAGNOSIS — I719 Aortic aneurysm of unspecified site, without rupture: Secondary | ICD-10-CM

## 2014-09-17 LAB — POCT INR: INR: 3.6

## 2014-09-19 ENCOUNTER — Encounter (HOSPITAL_COMMUNITY): Payer: BLUE CROSS/BLUE SHIELD

## 2014-09-19 ENCOUNTER — Encounter (HOSPITAL_COMMUNITY)
Admission: RE | Admit: 2014-09-19 | Discharge: 2014-09-19 | Disposition: A | Discharge status: Home / Self Care | Payer: BLUE CROSS/BLUE SHIELD | Source: Ambulatory Visit | Attending: Cardiovascular Disease | Admitting: Cardiovascular Disease

## 2014-09-19 ENCOUNTER — Ambulatory Visit (HOSPITAL_COMMUNITY): Payer: BLUE CROSS/BLUE SHIELD

## 2014-09-19 DIAGNOSIS — Z48812 Encounter for surgical aftercare following surgery on the circulatory system: Secondary | ICD-10-CM | POA: Diagnosis not present

## 2014-09-20 ENCOUNTER — Encounter: Payer: Self-pay | Admitting: Cardiothoracic Surgery

## 2014-09-20 ENCOUNTER — Encounter: Payer: Self-pay | Admitting: Cardiovascular Disease

## 2014-09-22 ENCOUNTER — Encounter (HOSPITAL_COMMUNITY)
Admission: RE | Admit: 2014-09-22 | Discharge: 2014-09-22 | Disposition: A | Discharge status: Home / Self Care | Payer: BLUE CROSS/BLUE SHIELD | Source: Ambulatory Visit | Attending: Cardiovascular Disease | Admitting: Cardiovascular Disease

## 2014-09-22 ENCOUNTER — Encounter (HOSPITAL_COMMUNITY): Payer: BLUE CROSS/BLUE SHIELD

## 2014-09-22 ENCOUNTER — Ambulatory Visit (HOSPITAL_COMMUNITY): Payer: BLUE CROSS/BLUE SHIELD

## 2014-09-22 DIAGNOSIS — Z48812 Encounter for surgical aftercare following surgery on the circulatory system: Secondary | ICD-10-CM | POA: Diagnosis not present

## 2014-09-24 ENCOUNTER — Encounter (HOSPITAL_COMMUNITY)
Admission: RE | Admit: 2014-09-24 | Discharge: 2014-09-24 | Disposition: A | Discharge status: Home / Self Care | Payer: BLUE CROSS/BLUE SHIELD | Source: Ambulatory Visit | Attending: Cardiovascular Disease | Admitting: Cardiovascular Disease

## 2014-09-24 ENCOUNTER — Encounter (HOSPITAL_COMMUNITY): Payer: BLUE CROSS/BLUE SHIELD

## 2014-09-24 ENCOUNTER — Ambulatory Visit (HOSPITAL_COMMUNITY): Payer: BLUE CROSS/BLUE SHIELD

## 2014-09-24 DIAGNOSIS — Z48812 Encounter for surgical aftercare following surgery on the circulatory system: Secondary | ICD-10-CM | POA: Diagnosis not present

## 2014-09-24 NOTE — Progress Notes (Signed)
Reviewed home exercise with pt today.  Pt plans to walk at home for exercise.  Reviewed THR, pulse, RPE, sign and symptoms, NTG use, and when to call 911 or MD.  Pt needs to practice more with finding pulse and was encouraged to attending counting your pulse class in two weeks.  Pt voiced understanding. Fabio Pierce, MA, ACSM RCEP

## 2014-09-26 ENCOUNTER — Encounter (HOSPITAL_COMMUNITY): Payer: BLUE CROSS/BLUE SHIELD

## 2014-09-26 ENCOUNTER — Ambulatory Visit (HOSPITAL_COMMUNITY): Payer: BLUE CROSS/BLUE SHIELD

## 2014-09-26 ENCOUNTER — Encounter (HOSPITAL_COMMUNITY)
Admission: RE | Admit: 2014-09-26 | Discharge: 2014-09-26 | Disposition: A | Discharge status: Home / Self Care | Payer: BLUE CROSS/BLUE SHIELD | Source: Ambulatory Visit | Attending: Cardiovascular Disease | Admitting: Cardiovascular Disease

## 2014-09-26 DIAGNOSIS — Z48812 Encounter for surgical aftercare following surgery on the circulatory system: Secondary | ICD-10-CM | POA: Diagnosis not present

## 2014-09-29 ENCOUNTER — Encounter (HOSPITAL_COMMUNITY): Payer: BLUE CROSS/BLUE SHIELD

## 2014-09-29 ENCOUNTER — Encounter (HOSPITAL_COMMUNITY)
Admission: RE | Admit: 2014-09-29 | Discharge: 2014-09-29 | Disposition: A | Discharge status: Home / Self Care | Payer: BLUE CROSS/BLUE SHIELD | Source: Ambulatory Visit | Attending: Cardiovascular Disease | Admitting: Cardiovascular Disease

## 2014-09-29 ENCOUNTER — Ambulatory Visit (HOSPITAL_COMMUNITY): Payer: BLUE CROSS/BLUE SHIELD

## 2014-09-29 DIAGNOSIS — Z48812 Encounter for surgical aftercare following surgery on the circulatory system: Secondary | ICD-10-CM | POA: Diagnosis not present

## 2014-10-01 ENCOUNTER — Ambulatory Visit (HOSPITAL_COMMUNITY): Payer: BLUE CROSS/BLUE SHIELD

## 2014-10-01 ENCOUNTER — Encounter (HOSPITAL_COMMUNITY): Payer: BLUE CROSS/BLUE SHIELD

## 2014-10-01 ENCOUNTER — Encounter (HOSPITAL_COMMUNITY)
Admission: RE | Admit: 2014-10-01 | Discharge: 2014-10-01 | Disposition: A | Discharge status: Home / Self Care | Payer: BLUE CROSS/BLUE SHIELD | Source: Ambulatory Visit | Attending: Cardiovascular Disease | Admitting: Cardiovascular Disease

## 2014-10-01 DIAGNOSIS — Z48812 Encounter for surgical aftercare following surgery on the circulatory system: Secondary | ICD-10-CM | POA: Diagnosis not present

## 2014-10-03 ENCOUNTER — Ambulatory Visit
Admission: RE | Admit: 2014-10-03 | Discharge: 2014-10-03 | Disposition: A | Discharge status: Home / Self Care | Payer: BLUE CROSS/BLUE SHIELD | Source: Ambulatory Visit | Attending: Cardiovascular Disease | Admitting: Cardiovascular Disease

## 2014-10-03 ENCOUNTER — Ambulatory Visit (INDEPENDENT_AMBULATORY_CARE_PROVIDER_SITE_OTHER): Payer: BLUE CROSS/BLUE SHIELD | Admitting: *Deleted

## 2014-10-03 ENCOUNTER — Encounter (HOSPITAL_COMMUNITY)
Admission: RE | Admit: 2014-10-03 | Discharge: 2014-10-03 | Disposition: A | Discharge status: Home / Self Care | Payer: BLUE CROSS/BLUE SHIELD | Source: Ambulatory Visit | Attending: Cardiovascular Disease | Admitting: Cardiovascular Disease

## 2014-10-03 ENCOUNTER — Encounter (HOSPITAL_COMMUNITY): Payer: BLUE CROSS/BLUE SHIELD

## 2014-10-03 ENCOUNTER — Ambulatory Visit (HOSPITAL_COMMUNITY): Payer: BLUE CROSS/BLUE SHIELD

## 2014-10-03 DIAGNOSIS — Z5181 Encounter for therapeutic drug level monitoring: Secondary | ICD-10-CM

## 2014-10-03 DIAGNOSIS — R911 Solitary pulmonary nodule: Secondary | ICD-10-CM

## 2014-10-03 DIAGNOSIS — Z48812 Encounter for surgical aftercare following surgery on the circulatory system: Secondary | ICD-10-CM | POA: Diagnosis not present

## 2014-10-03 DIAGNOSIS — Z954 Presence of other heart-valve replacement: Secondary | ICD-10-CM | POA: Diagnosis not present

## 2014-10-03 DIAGNOSIS — Z9889 Other specified postprocedural states: Secondary | ICD-10-CM

## 2014-10-03 DIAGNOSIS — I719 Aortic aneurysm of unspecified site, without rupture: Secondary | ICD-10-CM | POA: Diagnosis not present

## 2014-10-03 DIAGNOSIS — Z952 Presence of prosthetic heart valve: Secondary | ICD-10-CM

## 2014-10-03 LAB — POCT INR: INR: 2.5

## 2014-10-03 MED ORDER — IOHEXOL 350 MG/ML SOLN: 100.0000 mL | Freq: Once | INTRAVENOUS | Status: AC | PRN

## 2014-10-06 ENCOUNTER — Encounter (HOSPITAL_COMMUNITY): Payer: BLUE CROSS/BLUE SHIELD

## 2014-10-06 ENCOUNTER — Encounter: Payer: Self-pay | Admitting: Cardiothoracic Surgery

## 2014-10-06 ENCOUNTER — Encounter (HOSPITAL_COMMUNITY)
Admission: RE | Admit: 2014-10-06 | Discharge: 2014-10-06 | Disposition: A | Discharge status: Home / Self Care | Payer: BLUE CROSS/BLUE SHIELD | Source: Ambulatory Visit | Attending: Cardiovascular Disease | Admitting: Cardiovascular Disease

## 2014-10-06 ENCOUNTER — Ambulatory Visit (INDEPENDENT_AMBULATORY_CARE_PROVIDER_SITE_OTHER)
Admission: RE | Admit: 2014-10-06 | Discharge: 2014-10-06 | Disposition: A | Discharge status: Home / Self Care | Payer: BLUE CROSS/BLUE SHIELD | Source: Ambulatory Visit | Attending: Cardiovascular Disease | Admitting: Cardiovascular Disease

## 2014-10-06 ENCOUNTER — Ambulatory Visit (HOSPITAL_COMMUNITY): Payer: BLUE CROSS/BLUE SHIELD

## 2014-10-06 ENCOUNTER — Encounter: Payer: Self-pay | Admitting: Cardiovascular Disease

## 2014-10-06 DIAGNOSIS — R911 Solitary pulmonary nodule: Secondary | ICD-10-CM

## 2014-10-06 DIAGNOSIS — Z9889 Other specified postprocedural states: Secondary | ICD-10-CM

## 2014-10-06 DIAGNOSIS — Z48812 Encounter for surgical aftercare following surgery on the circulatory system: Secondary | ICD-10-CM | POA: Diagnosis not present

## 2014-10-06 MED ORDER — IOHEXOL 350 MG/ML SOLN
100.0000 mL | Freq: Once | INTRAVENOUS | Status: AC | PRN
  Administered 2014-10-06: 100 mL via INTRAVENOUS

## 2014-10-08 ENCOUNTER — Ambulatory Visit (INDEPENDENT_AMBULATORY_CARE_PROVIDER_SITE_OTHER): Payer: BLUE CROSS/BLUE SHIELD | Admitting: Cardiothoracic Surgery

## 2014-10-08 ENCOUNTER — Encounter (HOSPITAL_COMMUNITY)
Admission: RE | Admit: 2014-10-08 | Discharge: 2014-10-08 | Disposition: A | Discharge status: Home / Self Care | Payer: BLUE CROSS/BLUE SHIELD | Source: Ambulatory Visit | Attending: Cardiovascular Disease | Admitting: Cardiovascular Disease

## 2014-10-08 ENCOUNTER — Encounter: Payer: Self-pay | Admitting: Cardiothoracic Surgery

## 2014-10-08 ENCOUNTER — Ambulatory Visit (HOSPITAL_COMMUNITY): Payer: BLUE CROSS/BLUE SHIELD

## 2014-10-08 ENCOUNTER — Encounter (HOSPITAL_COMMUNITY): Payer: BLUE CROSS/BLUE SHIELD

## 2014-10-08 VITALS — BP 129/84 | HR 50 | Resp 16 | Ht 70.0 in | Wt 213.5 lb

## 2014-10-08 DIAGNOSIS — Z954 Presence of other heart-valve replacement: Secondary | ICD-10-CM | POA: Diagnosis not present

## 2014-10-08 DIAGNOSIS — Z952 Presence of prosthetic heart valve: Secondary | ICD-10-CM

## 2014-10-08 DIAGNOSIS — I351 Nonrheumatic aortic (valve) insufficiency: Secondary | ICD-10-CM

## 2014-10-08 DIAGNOSIS — Z9889 Other specified postprocedural states: Secondary | ICD-10-CM | POA: Diagnosis not present

## 2014-10-08 DIAGNOSIS — I77810 Thoracic aortic ectasia: Secondary | ICD-10-CM | POA: Diagnosis not present

## 2014-10-08 DIAGNOSIS — Z48812 Encounter for surgical aftercare following surgery on the circulatory system: Secondary | ICD-10-CM | POA: Diagnosis not present

## 2014-10-08 NOTE — Progress Notes (Signed)
PCP is Pearla Dubonnet, MD Referring Provider is Wendall Stade, MD  Chief Complaint  Patient presents with  . Follow-up    4 month after CTA CHEST per cardiology 10/06/14    [id number] followup 6 months after mechanical aVR-replacement for severe AI, LV dysfunction and large ascending aortic aneurysm. The patient is done well and is back to work. Postoperative echocardiogram shows improved LVEF now at 45% with normal functioning mechanical aortic valve prosthesis.  Patient returns for followup of a CTA of the thoracic aorta. This shows intact aortic root and ascending aortic replacement without pseudoaneurysm. Arch and distal thoracic aorta are normal.  The patient has completed his phase II cardiac rehabilitation program and has done well.  The patient is on chronic Coumadin and denies any bleeding complications. His INR is checked to the Coumadin clinic.   Past Medical History  Diagnosis Date  . Aortic insufficiency     a. 02/2014 - admx with sx severe AI and assoc ascending aortic aneurysm (6.8 cm) >> s/p Bentall with mechancial AVR (Dr. Donata Clay)  . Thoracic ascending aortic aneurysm     Echo 11/15: severe AI, severely dilated ascending aorta (6.5 cm);  s/p Bentall 02/2014 with aortic root replacement using a mechanical valve-conduit and reimplantation of coronary arteries (St. Jude 27 mm valve), replacement of ascending aorta to the proximal arch with a 30 mm Dacron graft  . Pericardial effusion     large pericardial effusion post Bentall procedure >> s/p pericardial window  . Hx of echocardiogram     a. Echo 11/15: EF 55%, severe AI, marked dilation of ascending aorta; b. Echo 12/15: EF 30-35%, large effusion; c. Echo 2/16: EF 40-45%, no RWMA, mechanical AVR ok, trivial effusion post to heart   . NICM (nonischemic cardiomyopathy)     a. TEE (11/15): Mild LVH, EF 45-50%, diffuse HK, prominent apical trabeculations, severe AI, severely dilated ascending aorta (6.9 cm), mild  MR;  b. EF 30-35% post AVR >> c. EF 40-45% by echo 05/2014  . Hx of cardiac catheterization     a. LHC (11/15): EF 55%, marked ascending aortic dilatation with severe AI on aortic root angiography, normal coronary arteries    Past Surgical History  Procedure Laterality Date  . Tee without cardioversion N/A 02/20/2014    Procedure: TRANSESOPHAGEAL ECHOCARDIOGRAM (TEE);  Surgeon: Lars Masson, MD;  Location: [facility] ENDOSCOPY;  Service: Cardiovascular;  Laterality: N/AZachary George procedure N/A 02/24/2014    Procedure: BENTALL PROCEDURE;  Surgeon: Kerin Perna, MD;  Location[facility] OR;  Service: Open Heart Surgery;  Laterality: N/A;  . Intraoperative transesophageal echocardiogram N/A 02/24/2014    Procedure: INTRAOPERATIVE TRANSESOPHAGEAL ECHOCARDIOGRAM;  Surgeon: Kerin Perna, MD;  Location: [facility] OR;  Service: Open Heart Surgery;  Laterality: N/A;  . Left and right heart catheterization with coronary angiogram N/A 02/20/2014    Procedure: LEFT AND RIGHT HEART CATHETERIZATION WITH CORONARY ANGIOGRAM;  Surgeon: Micheline Chapman, MD;  Location: [facility] CATH LAB;  Service: Cardiovascular;  Laterality: N/A;  . Subxyphoid pericardial window N/A 04/02/2014    Procedure: SUBXYPHOID PERICARDIAL WINDOW;  Surgeon: Kerin Perna, MD;  Location: [facility] OR;  Service: Thoracic;  Laterality: N/A;  . Tee without cardioversion N/A 04/02/2014    Procedure: TRANSESOPHAGEAL ECHOCARDIOGRAM (TEE);  Surgeon: Kerin Perna, MD;  Location: [facility] OR;  Service: Thoracic;  Laterality: N/A;    Family History  Problem Relation Age of Onset  . Heart attack Neg Hx   . Stroke Neg  Hx   . Cancer Maternal Grandmother     Social History History  Substance Use Topics  . Smoking status: Never Smoker   . Smokeless tobacco: Not on file  . Alcohol Use: No    Current Outpatient Prescriptions  Medication Sig Dispense Refill  . acetaminophen (TYLENOL) 500 MG tablet Take 500 mg by mouth every 6 (six) hours as needed for mild pain  or fever.     Marland Kitchen aspirin 81 MG EC tablet Take 1 tablet (81 mg total) by mouth daily.    . carvedilol (COREG) 12.5 MG tablet Take 1 tablet (12.5 mg total) by mouth 2 (two) times daily. 60 tablet 11  . lisinopril (PRINIVIL,ZESTRIL) 5 MG tablet Take 1 tablet (5 mg total) by mouth 2 (two) times daily. 60 tablet 11  . warfarin (COUMADIN) 7.5 MG tablet Take 1 tablet (7.5 mg total) by mouth as directed. 40 tablet 3   No current facility-administered medications for this visit.    No Known Allergies  Review of Systems   Review of Systems  General:  No weight loss   no fever   no decreased energy  no night sweats Cardiac:  -Chest pain with exertion - resting chest pain  - SOB with exertion  - Orthopnea                  -PND - ankle edema - syncope Pulmonary:  no dyspnea,no cough, no productive cough no home oxygen no hemoptysis GI: no difficulty swallowing  no GERD no jaundice  no melena  no hematemesis no        abdominal pain GU:  no dysuria  no hematuria  no frequent UTI no BPH Vascular:  no claudication  No TIA  No varicose veins no DVT Neuro:  no sroke no seizures no TIA no head trauma no vision changes Musculoskeletal:  normal mobility no arthritis  no gout  no joint swelling Skin: no rash  no skin ulceration  no skin cancer Endocrine: diabetes  no thyroid didease Hematologic: no easy bruising  no blood transfusions  no frequent epistaxis ENT : no painful teeth no dentures no loose teeth Psych : no anxiety  no depression  no psych hospitalizations        BP 129/84 mmHg  Pulse 50  Resp 16  Ht 5\' 10"  (1.778 m)  Wt 213 lb 8 oz (96.843 kg)  BMI 30.63 kg/m2  SpO2 99% Physical Exam       Physical Exam  General: middle-aged male no acute distress HEENT: Normocephalic pupils equal , dentition adequate Neck: Supple without JVD, adenopathy, or bruit Chest: Clear to auscultation, symmetrical breath sounds, no rhonchi, no tenderness             or deformity Cardiovascular: Regular  rate and rhythm, no murmur, no gallop,Sharp aortic valve closure sound ,peripheral pulses palpable in all extremities Abdomen:  Soft, nontender, no palpable mass or organomegaly Extremities: Warm, well-perfused, no clubbing cyanosis edema or tenderness,              no venous stasis changes of the legs Rectal/GU: Deferred Neuro: Grossly non--focal and symmetrical throughout Skin: Clean and dry without rash or ulceration   Diagnostic Tests: CTA thoracic aorta in detail I reviewed showing intact aortic root and ascending aneurysm repair. No pericardial effusion. No significant pulmonary parenchymal pathology.  Impression: Excellent recovery in 6 months postop mechanical aVR- root replacement for severe AI. Improved postop LV function. Sinus rhythm.  Plan:patient be  followed by his cardiology and primary care physician's. He'll return as necessary. He understands the importance of dental hygiene antibiotic prophylaxis prior to invasive dental procedures.   Mikey Bussing, MD [facility] [phone]

## 2014-10-09 NOTE — Progress Notes (Signed)
Cardiology Office Note   Date:  10/10/2014   ID:  Jeffrey [name], DOB 1965-07-05, MRN [id number]  PCP:  Pearla Dubonnet, MD  Cardiologist:  Dr. [name]     Chief Complaint  Patient presents with  . Aortic Insufficiency s/p AVR     History of Present Illness: Jeffrey [name] is a 49 y.o. male with a hx of severe aortic insufficiency in the setting of ascending thoracic aortic aneurysm (6.8 cm). He underwent Bentall procedure by Dr. Donata Clay with aortic root replacement using a mechanical valve-conduit and reimplantation of coronary arteries (St. Jude 27 mm valve), replacement of ascending aorta to the proximal arch with a 30 mm Dacron graft 02/2014. Post surgical FU echo 03/2014 demonstrated reduced EF and a large pericardial effusion. He was admitted by Dr. Donata Clay and underwent placement of a pericardial window. Follow-up echocardiogram 05/2014 demonstrated improved LV function with an EF of 40-45% and trivial pericardial effusion.   Last seen by Dr. [name] 07/02/14. His beta blocker was titrated further. He returns for follow-up.  Here with his wife and 2 yo son.  Doing well.  The patient denies chest pain, shortness of breath, syncope, orthopnea, PND or significant pedal edema.  He is NYHA 2.     Studies:  2D Echocardiogram 05/20/14 - EF 40% - 45%. Wall motion was normal  - Ventricular septum: Septal motion showed paradox c/w intraventricular conduction delay. - Aortic valve: A mechanical prosthesis was present and functioning normally. - Pericardium, extracardiac: A trivial pericardial effusion was identified posterior to the heart.   Past Medical History  Diagnosis Date  . Aortic insufficiency     a. 02/2014 - admx with sx severe AI and assoc ascending aortic aneurysm (6.8 cm) >> s/p Bentall with mechancial AVR (Dr. Donata Clay)  . Thoracic ascending aortic aneurysm     Echo 11/15: severe AI, severely dilated ascending aorta (6.5 cm);  s/p Bentall  02/2014 with aortic root replacement using a mechanical valve-conduit and reimplantation of coronary arteries (St. Jude 27 mm valve), replacement of ascending aorta to the proximal arch with a 30 mm Dacron graft  . Pericardial effusion     large pericardial effusion post Bentall procedure >> s/p pericardial window  . Hx of echocardiogram     a. Echo 11/15: EF 55%, severe AI, marked dilation of ascending aorta; b. Echo 12/15: EF 30-35%, large effusion; c. Echo 2/16: EF 40-45%, no RWMA, mechanical AVR ok, trivial effusion post to heart   . NICM (nonischemic cardiomyopathy)     a. TEE (11/15): Mild LVH, EF 45-50%, diffuse HK, prominent apical trabeculations, severe AI, severely dilated ascending aorta (6.9 cm), mild MR;  b. EF 30-35% post AVR >> c. EF 40-45% by echo 05/2014  . Hx of cardiac catheterization     a. LHC (11/15): EF 55%, marked ascending aortic dilatation with severe AI on aortic root angiography, normal coronary arteries    Past Surgical History  Procedure Laterality Date  . Tee without cardioversion N/A 02/20/2014    Procedure: TRANSESOPHAGEAL ECHOCARDIOGRAM (TEE);  Surgeon: Lars Masson, MD;  Location: [facility] ENDOSCOPY;  Service: Cardiovascular;  Laterality: N/AZachary George procedure N/A 02/24/2014    Procedure: BENTALL PROCEDURE;  Surgeon: Kerin Perna, MD;  Location: [facility] OR;  Service: Open Heart Surgery;  Laterality: N/A;  . Intraoperative transesophageal echocardiogram N/A 02/24/2014    Procedure: INTRAOPERATIVE TRANSESOPHAGEAL ECHOCARDIOGRAM;  Surgeon: Kerin Perna, MD;  Location: [facility] OR;  Service: Open Heart Surgery;  Laterality: N/A;  . Left and right heart catheterization with coronary angiogram N/A 02/20/2014    Procedure: LEFT AND RIGHT HEART CATHETERIZATION WITH CORONARY ANGIOGRAM;  Surgeon: Micheline Chapman, MD;  Location: [facility] CATH LAB;  Service: Cardiovascular;  Laterality: N/A;  . Subxyphoid pericardial window N/A 04/02/2014    Procedure: SUBXYPHOID PERICARDIAL  WINDOW;  Surgeon: Kerin Perna, MD;  Location: [facility] OR;  Service: Thoracic;  Laterality: N/A;  . Tee without cardioversion N/A 04/02/2014    Procedure: TRANSESOPHAGEAL ECHOCARDIOGRAM (TEE);  Surgeon: Kerin Perna, MD;  Location: [facility] OR;  Service: Thoracic;  Laterality: N/A;     Current Outpatient Prescriptions  Medication Sig Dispense Refill  . acetaminophen (TYLENOL) 500 MG tablet Take 500 mg by mouth every 6 (six) hours as needed for mild pain or fever.     Marland Kitchen aspirin 81 MG EC tablet Take 1 tablet (81 mg total) by mouth daily.    . carvedilol (COREG) 12.5 MG tablet Take 1 tablet (12.5 mg total) by mouth 2 (two) times daily. 60 tablet 11  . lisinopril (PRINIVIL,ZESTRIL) 5 MG tablet Take 1 tablet (5 mg total) by mouth 2 (two) times daily. 60 tablet 11  . warfarin (COUMADIN) 7.5 MG tablet Take 1 tablet (7.5 mg total) by mouth as directed. 40 tablet 3   No current facility-administered medications for this visit.    Allergies:   Review of patient's allergies indicates no known allergies.    Social History:  The patient  reports that he has never smoked. He does not have any smokeless tobacco history on file. He reports that he does not drink alcohol or use illicit drugs.   Family History:  The patient's family history includes Cancer in his maternal grandmother. There is no history of Heart attack or Stroke.    ROS:   Please see the history of present illness.   Review of Systems  Hematologic/Lymphatic: Negative for bleeding problem.  Gastrointestinal: Negative for hematochezia and melena.  Genitourinary: Negative for hematuria.  All other systems reviewed and are negative.     PHYSICAL EXAM: VS:  BP 120/70 mmHg  Pulse 63  Ht 5\' 10"  (1.778 m)  Wt 212 lb (96.163 kg)  BMI 30.42 kg/m2    Wt Readings from Last 3 Encounters:  10/10/14 212 lb (96.163 kg)  10/08/14 213 lb 8 oz (96.843 kg)  09/11/14 212 lb 1.3 oz (96.2 kg)     GEN: Well nourished, well developed, in no acute  distress HEENT: normal Neck: no JVD,  no masses Cardiac:  Normal S1/ Mechanical S2, RRR; no murmur, no rubs or gallops, no edema   Respiratory:  clear to auscultation bilaterally, no wheezing, rhonchi or rales. GI: soft, nontender, nondistended, + BS MS: no deformity or atrophy Skin: warm and dry  Neuro:  CNs II-XII intact, Strength and sensation are intact Psych: Normal affect   EKG:  EKG is ordered today.  It demonstrates:   NSR, HR 63, leftward axis, T-wave inversions in 1, 2, aVL, V5-V6, no significant change when compared to prior tracings   Recent Labs: 02/18/2014: Pro B Natriuretic peptide (BNP) 2601.0*; TSH 2.090 02/25/2014: Magnesium 2.2 07/21/2014: ALT 29; BUN 9; Creatinine, Ser 0.79; Hemoglobin 13.1; Platelets 180; Potassium 4.5; Sodium 136    Lipid Panel No results found for: CHOL, TRIG, HDL, CHOLHDL, VLDL, LDLCALC, LDLDIRECT    ASSESSMENT AND PLAN:  Severe aortic insufficiency and Aortic aneurysm S/P AVR (aortic valve replacement) and aortoplasty:  Continues to do well.  Recent  FU CT with stable findings.  He has had recent FU with Dr. Donata Clay.  Continue SBE prophylaxis.  He is tolerating Coumadin and is managed in our Coumadin clinic.    NICM (nonischemic cardiomyopathy):  Continue current dose of beta-blocker and ACE inhibitor.  FU echo pending in September.   Pericardial effusion s/p Pericardial Window:  No significant effusion on echo in 05/2014.      Medication Changes: Current medicines are reviewed at length with the patient today.  Concerns regarding medicines are as outlined above.  The following changes have been made:   Discontinued Medications   No medications on file   Modified Medications   No medications on file   New Prescriptions   No medications on file     Labs/ tests ordered today include:   Orders Placed This Encounter  Procedures  . EKG 12-Lead     Disposition:   FU with Dr. [name] 12/2014 as planned.      Signed, Brynda Rim, MHS 10/10/2014 1:15 PM    [facility] [address] [place], [place], [place]  [address] Phone: [phone]; Fax: [phone]

## 2014-10-10 ENCOUNTER — Encounter (HOSPITAL_COMMUNITY)
Admission: RE | Admit: 2014-10-10 | Discharge: 2014-10-10 | Disposition: A | Discharge status: Home / Self Care | Payer: BLUE CROSS/BLUE SHIELD | Source: Ambulatory Visit | Attending: Cardiovascular Disease | Admitting: Cardiovascular Disease

## 2014-10-10 ENCOUNTER — Ambulatory Visit (INDEPENDENT_AMBULATORY_CARE_PROVIDER_SITE_OTHER): Payer: BLUE CROSS/BLUE SHIELD | Admitting: Physician Assistant

## 2014-10-10 ENCOUNTER — Ambulatory Visit (HOSPITAL_COMMUNITY): Payer: BLUE CROSS/BLUE SHIELD

## 2014-10-10 ENCOUNTER — Encounter (HOSPITAL_COMMUNITY): Payer: BLUE CROSS/BLUE SHIELD

## 2014-10-10 ENCOUNTER — Encounter: Payer: Self-pay | Admitting: Physician Assistant

## 2014-10-10 VITALS — BP 120/70 | HR 63 | Ht 70.0 in | Wt 212.0 lb

## 2014-10-10 DIAGNOSIS — I351 Nonrheumatic aortic (valve) insufficiency: Secondary | ICD-10-CM | POA: Diagnosis not present

## 2014-10-10 DIAGNOSIS — I719 Aortic aneurysm of unspecified site, without rupture: Secondary | ICD-10-CM | POA: Diagnosis not present

## 2014-10-10 DIAGNOSIS — Z952 Presence of prosthetic heart valve: Secondary | ICD-10-CM | POA: Diagnosis not present

## 2014-10-10 DIAGNOSIS — I319 Disease of pericardium, unspecified: Secondary | ICD-10-CM

## 2014-10-10 DIAGNOSIS — I3139 Other pericardial effusion (noninflammatory): Secondary | ICD-10-CM

## 2014-10-10 DIAGNOSIS — I313 Pericardial effusion (noninflammatory): Secondary | ICD-10-CM

## 2014-10-10 DIAGNOSIS — Z48812 Encounter for surgical aftercare following surgery on the circulatory system: Secondary | ICD-10-CM | POA: Insufficient documentation

## 2014-10-10 DIAGNOSIS — I429 Cardiomyopathy, unspecified: Secondary | ICD-10-CM | POA: Diagnosis not present

## 2014-10-10 DIAGNOSIS — I428 Other cardiomyopathies: Secondary | ICD-10-CM

## 2014-10-10 DIAGNOSIS — Z954 Presence of other heart-valve replacement: Secondary | ICD-10-CM | POA: Diagnosis not present

## 2014-10-10 NOTE — Patient Instructions (Signed)
Medication Instructions:  Your physician recommends that you continue on your current medications as directed. Please refer to the Current Medication list given to you today.   Labwork: NONE  Testing/Procedures: NONE  Follow-Up: 9//22/2016 WITH DR. [name]  Any Other Special Instructions Will Be Listed Below (If Applicable).

## 2014-10-13 ENCOUNTER — Ambulatory Visit (HOSPITAL_COMMUNITY): Payer: BLUE CROSS/BLUE SHIELD

## 2014-10-15 ENCOUNTER — Ambulatory Visit (HOSPITAL_COMMUNITY): Payer: BLUE CROSS/BLUE SHIELD

## 2014-10-15 ENCOUNTER — Encounter (HOSPITAL_COMMUNITY)
Admission: RE | Admit: 2014-10-15 | Discharge: 2014-10-15 | Disposition: A | Discharge status: Home / Self Care | Payer: BLUE CROSS/BLUE SHIELD | Source: Ambulatory Visit | Attending: Cardiovascular Disease | Admitting: Cardiovascular Disease

## 2014-10-15 DIAGNOSIS — Z48812 Encounter for surgical aftercare following surgery on the circulatory system: Secondary | ICD-10-CM | POA: Diagnosis not present

## 2014-10-17 ENCOUNTER — Encounter (HOSPITAL_COMMUNITY)
Admission: RE | Admit: 2014-10-17 | Discharge: 2014-10-17 | Disposition: A | Discharge status: Home / Self Care | Payer: BLUE CROSS/BLUE SHIELD | Source: Ambulatory Visit | Attending: Cardiovascular Disease | Admitting: Cardiovascular Disease

## 2014-10-17 ENCOUNTER — Ambulatory Visit (HOSPITAL_COMMUNITY): Payer: BLUE CROSS/BLUE SHIELD

## 2014-10-17 DIAGNOSIS — Z48812 Encounter for surgical aftercare following surgery on the circulatory system: Secondary | ICD-10-CM | POA: Diagnosis not present

## 2014-10-20 ENCOUNTER — Encounter (HOSPITAL_COMMUNITY)
Admission: RE | Admit: 2014-10-20 | Discharge: 2014-10-20 | Disposition: A | Discharge status: Home / Self Care | Payer: BLUE CROSS/BLUE SHIELD | Source: Ambulatory Visit | Attending: Cardiovascular Disease | Admitting: Cardiovascular Disease

## 2014-10-20 ENCOUNTER — Ambulatory Visit (HOSPITAL_COMMUNITY): Payer: BLUE CROSS/BLUE SHIELD

## 2014-10-20 DIAGNOSIS — Z48812 Encounter for surgical aftercare following surgery on the circulatory system: Secondary | ICD-10-CM | POA: Diagnosis not present

## 2014-10-20 NOTE — Progress Notes (Signed)
Jeffrey [name] 49 y.o. male Nutrition Note Spoke with pt.  Nutrition Survey reviewed with pt. Pt is following Step 1 of the Therapeutic Lifestyle Changes diet. Pt wants to lose wt. Pt has not been actively trying to lose wt at this time. Benefit of 5-7% wt loss with pre-diabetes reviewed. Pre-diabetes discussed. Pt is on Coumadin and is aware of the need to follow a consistent vitamin K diet. Pt expressed understanding of the information reviewed. Pt aware of nutrition education classes offered. Lab Results  Component Value Date   HGBA1C 5.8* 02/18/2014   Wt Readings from Last 3 Encounters:  10/10/14 212 lb (96.163 kg)  10/08/14 213 lb 8 oz (96.843 kg)  09/11/14 212 lb 1.3 oz (96.2 kg)   Nutrition Diagnosis ? Food-and nutrition-related knowledge deficit related to lack of exposure to information as related to diagnosis of: ? CVD ? Pre-DM ? Obesity related to excessive energy intake as evidenced by a BMI of 30.2  Nutrition Intervention ? Benefits of adopting Therapeutic Lifestyle Changes discussed when Medficts reviewed. ? Pt to attend the Portion Distortion class ? Pt given handouts for: ? Nutrition I class ? Nutrition II class ? Continue client-centered nutrition education by RD, as part of interdisciplinary care.  Goal(s) ? Pt to identify and limit food sources of saturated fat, trans fat, and cholesterol ? Pt to identify food quantities necessary to achieve: ? wt loss to a goal wt of 188-206 lb (85.5-93.7 kg) at graduation from cardiac rehab.   Monitor and Evaluate progress toward nutrition goal with team.  Mickle Plumb, M.Ed, RD, LDN, CDE 10/20/2014 3:36 PM

## 2014-10-22 ENCOUNTER — Ambulatory Visit (HOSPITAL_COMMUNITY): Payer: BLUE CROSS/BLUE SHIELD

## 2014-10-22 ENCOUNTER — Ambulatory Visit (INDEPENDENT_AMBULATORY_CARE_PROVIDER_SITE_OTHER): Payer: BLUE CROSS/BLUE SHIELD | Admitting: *Deleted

## 2014-10-22 ENCOUNTER — Encounter (HOSPITAL_COMMUNITY)
Admission: RE | Admit: 2014-10-22 | Discharge: 2014-10-22 | Disposition: A | Discharge status: Home / Self Care | Payer: BLUE CROSS/BLUE SHIELD | Source: Ambulatory Visit | Attending: Cardiovascular Disease | Admitting: Cardiovascular Disease

## 2014-10-22 DIAGNOSIS — Z5181 Encounter for therapeutic drug level monitoring: Secondary | ICD-10-CM | POA: Diagnosis not present

## 2014-10-22 DIAGNOSIS — Z954 Presence of other heart-valve replacement: Secondary | ICD-10-CM

## 2014-10-22 DIAGNOSIS — Z48812 Encounter for surgical aftercare following surgery on the circulatory system: Secondary | ICD-10-CM | POA: Diagnosis not present

## 2014-10-22 DIAGNOSIS — Z952 Presence of prosthetic heart valve: Secondary | ICD-10-CM

## 2014-10-22 DIAGNOSIS — I719 Aortic aneurysm of unspecified site, without rupture: Secondary | ICD-10-CM

## 2014-10-22 LAB — POCT INR: INR: 2.5

## 2014-10-24 ENCOUNTER — Ambulatory Visit (HOSPITAL_COMMUNITY): Payer: BLUE CROSS/BLUE SHIELD

## 2014-10-24 ENCOUNTER — Encounter (HOSPITAL_COMMUNITY)
Admission: RE | Admit: 2014-10-24 | Discharge: 2014-10-24 | Disposition: A | Discharge status: Home / Self Care | Payer: BLUE CROSS/BLUE SHIELD | Source: Ambulatory Visit | Attending: Cardiovascular Disease | Admitting: Cardiovascular Disease

## 2014-10-24 DIAGNOSIS — Z48812 Encounter for surgical aftercare following surgery on the circulatory system: Secondary | ICD-10-CM | POA: Diagnosis not present

## 2014-10-27 ENCOUNTER — Encounter (HOSPITAL_COMMUNITY)
Admission: RE | Admit: 2014-10-27 | Discharge: 2014-10-27 | Disposition: A | Discharge status: Home / Self Care | Payer: BLUE CROSS/BLUE SHIELD | Source: Ambulatory Visit | Attending: Cardiovascular Disease | Admitting: Cardiovascular Disease

## 2014-10-27 ENCOUNTER — Ambulatory Visit (HOSPITAL_COMMUNITY): Payer: BLUE CROSS/BLUE SHIELD

## 2014-10-27 DIAGNOSIS — Z48812 Encounter for surgical aftercare following surgery on the circulatory system: Secondary | ICD-10-CM | POA: Diagnosis not present

## 2014-10-29 ENCOUNTER — Encounter (HOSPITAL_COMMUNITY)
Admission: RE | Admit: 2014-10-29 | Discharge: 2014-10-29 | Disposition: A | Discharge status: Home / Self Care | Payer: BLUE CROSS/BLUE SHIELD | Source: Ambulatory Visit | Attending: Cardiovascular Disease | Admitting: Cardiovascular Disease

## 2014-10-29 ENCOUNTER — Ambulatory Visit (HOSPITAL_COMMUNITY): Payer: BLUE CROSS/BLUE SHIELD

## 2014-10-29 DIAGNOSIS — Z48812 Encounter for surgical aftercare following surgery on the circulatory system: Secondary | ICD-10-CM | POA: Diagnosis not present

## 2014-10-31 ENCOUNTER — Encounter (HOSPITAL_COMMUNITY)
Admission: RE | Admit: 2014-10-31 | Discharge: 2014-10-31 | Disposition: A | Discharge status: Home / Self Care | Payer: BLUE CROSS/BLUE SHIELD | Source: Ambulatory Visit | Attending: Cardiovascular Disease | Admitting: Cardiovascular Disease

## 2014-10-31 ENCOUNTER — Ambulatory Visit (HOSPITAL_COMMUNITY): Payer: BLUE CROSS/BLUE SHIELD

## 2014-10-31 DIAGNOSIS — Z48812 Encounter for surgical aftercare following surgery on the circulatory system: Secondary | ICD-10-CM | POA: Diagnosis not present

## 2014-10-31 LAB — GLUCOSE, CAPILLARY: Glucose-Capillary: 101 mg/dL — ABNORMAL HIGH (ref 65–99)

## 2014-11-03 ENCOUNTER — Ambulatory Visit (HOSPITAL_COMMUNITY): Payer: BLUE CROSS/BLUE SHIELD

## 2014-11-03 ENCOUNTER — Encounter (HOSPITAL_COMMUNITY)
Admission: RE | Admit: 2014-11-03 | Discharge: 2014-11-03 | Disposition: A | Discharge status: Home / Self Care | Payer: BLUE CROSS/BLUE SHIELD | Source: Ambulatory Visit | Attending: Cardiovascular Disease | Admitting: Cardiovascular Disease

## 2014-11-03 DIAGNOSIS — Z48812 Encounter for surgical aftercare following surgery on the circulatory system: Secondary | ICD-10-CM | POA: Diagnosis not present

## 2014-11-05 ENCOUNTER — Ambulatory Visit (HOSPITAL_COMMUNITY): Payer: BLUE CROSS/BLUE SHIELD

## 2014-11-05 ENCOUNTER — Encounter (HOSPITAL_COMMUNITY): Payer: BLUE CROSS/BLUE SHIELD

## 2014-11-07 ENCOUNTER — Encounter (HOSPITAL_COMMUNITY)
Admission: RE | Admit: 2014-11-07 | Discharge: 2014-11-07 | Disposition: A | Discharge status: Home / Self Care | Payer: BLUE CROSS/BLUE SHIELD | Source: Ambulatory Visit | Attending: Cardiovascular Disease | Admitting: Cardiovascular Disease

## 2014-11-07 ENCOUNTER — Ambulatory Visit (HOSPITAL_COMMUNITY): Payer: BLUE CROSS/BLUE SHIELD

## 2014-11-07 DIAGNOSIS — Z48812 Encounter for surgical aftercare following surgery on the circulatory system: Secondary | ICD-10-CM | POA: Diagnosis not present

## 2014-11-10 ENCOUNTER — Encounter (HOSPITAL_COMMUNITY)
Admission: RE | Admit: 2014-11-10 | Discharge: 2014-11-10 | Disposition: A | Discharge status: Home / Self Care | Payer: BLUE CROSS/BLUE SHIELD | Source: Ambulatory Visit | Attending: Cardiovascular Disease | Admitting: Cardiovascular Disease

## 2014-11-10 ENCOUNTER — Ambulatory Visit (HOSPITAL_COMMUNITY): Payer: BLUE CROSS/BLUE SHIELD

## 2014-11-10 DIAGNOSIS — Z48812 Encounter for surgical aftercare following surgery on the circulatory system: Secondary | ICD-10-CM | POA: Diagnosis present

## 2014-11-10 DIAGNOSIS — Z952 Presence of prosthetic heart valve: Secondary | ICD-10-CM | POA: Insufficient documentation

## 2014-11-12 ENCOUNTER — Ambulatory Visit (HOSPITAL_COMMUNITY): Payer: BLUE CROSS/BLUE SHIELD

## 2014-11-12 ENCOUNTER — Encounter (HOSPITAL_COMMUNITY): Payer: Self-pay

## 2014-11-12 ENCOUNTER — Encounter (HOSPITAL_COMMUNITY)
Admission: RE | Admit: 2014-11-12 | Discharge: 2014-11-12 | Disposition: A | Discharge status: Home / Self Care | Payer: BLUE CROSS/BLUE SHIELD | Source: Ambulatory Visit | Attending: Cardiovascular Disease | Admitting: Cardiovascular Disease

## 2014-11-12 DIAGNOSIS — Z48812 Encounter for surgical aftercare following surgery on the circulatory system: Secondary | ICD-10-CM | POA: Diagnosis not present

## 2014-11-12 NOTE — Progress Notes (Signed)
Pt graduated from cardiac rehab program today with completion of 24 exercise sessions in Phase II. Pt maintained good attendance and progressed nicely during his participation in rehab as evidenced by increased MET level.   Medication list reconciled. Repeat  PHQ score- 0 .  Pt has made significant lifestyle changes and should be commended for his success. Pt feels he has achieved his goals during cardiac rehab, which include restored left shoulder mobility, weight maintenance from improved eating habits and establishing exercise routine.   Pt plans to continue exercise on his own at local gym.  

## 2014-11-14 ENCOUNTER — Ambulatory Visit (HOSPITAL_COMMUNITY): Payer: BLUE CROSS/BLUE SHIELD

## 2014-11-14 ENCOUNTER — Encounter (HOSPITAL_COMMUNITY): Payer: BLUE CROSS/BLUE SHIELD

## 2014-11-17 ENCOUNTER — Encounter (HOSPITAL_COMMUNITY): Payer: BLUE CROSS/BLUE SHIELD

## 2014-11-17 ENCOUNTER — Ambulatory Visit (HOSPITAL_COMMUNITY): Payer: BLUE CROSS/BLUE SHIELD

## 2014-11-19 ENCOUNTER — Ambulatory Visit (INDEPENDENT_AMBULATORY_CARE_PROVIDER_SITE_OTHER): Payer: BLUE CROSS/BLUE SHIELD | Admitting: *Deleted

## 2014-11-19 ENCOUNTER — Encounter (HOSPITAL_COMMUNITY): Payer: BLUE CROSS/BLUE SHIELD

## 2014-11-19 ENCOUNTER — Ambulatory Visit (HOSPITAL_COMMUNITY): Payer: BLUE CROSS/BLUE SHIELD

## 2014-11-19 DIAGNOSIS — Z954 Presence of other heart-valve replacement: Secondary | ICD-10-CM | POA: Diagnosis not present

## 2014-11-19 DIAGNOSIS — Z952 Presence of prosthetic heart valve: Secondary | ICD-10-CM

## 2014-11-19 DIAGNOSIS — I719 Aortic aneurysm of unspecified site, without rupture: Secondary | ICD-10-CM | POA: Diagnosis not present

## 2014-11-19 DIAGNOSIS — Z5181 Encounter for therapeutic drug level monitoring: Secondary | ICD-10-CM

## 2014-11-19 LAB — POCT INR: INR: 1.9

## 2014-11-21 ENCOUNTER — Ambulatory Visit (HOSPITAL_COMMUNITY): Payer: BLUE CROSS/BLUE SHIELD

## 2014-11-21 ENCOUNTER — Encounter (HOSPITAL_COMMUNITY): Payer: BLUE CROSS/BLUE SHIELD

## 2014-11-24 ENCOUNTER — Encounter (HOSPITAL_COMMUNITY): Payer: BLUE CROSS/BLUE SHIELD

## 2014-11-26 ENCOUNTER — Encounter (HOSPITAL_COMMUNITY): Payer: BLUE CROSS/BLUE SHIELD

## 2014-11-28 ENCOUNTER — Encounter (HOSPITAL_COMMUNITY): Payer: BLUE CROSS/BLUE SHIELD

## 2014-11-28 ENCOUNTER — Ambulatory Visit (INDEPENDENT_AMBULATORY_CARE_PROVIDER_SITE_OTHER): Payer: BLUE CROSS/BLUE SHIELD

## 2014-11-28 DIAGNOSIS — Z952 Presence of prosthetic heart valve: Secondary | ICD-10-CM

## 2014-11-28 DIAGNOSIS — Z5181 Encounter for therapeutic drug level monitoring: Secondary | ICD-10-CM

## 2014-11-28 DIAGNOSIS — I719 Aortic aneurysm of unspecified site, without rupture: Secondary | ICD-10-CM | POA: Diagnosis not present

## 2014-11-28 DIAGNOSIS — Z954 Presence of other heart-valve replacement: Secondary | ICD-10-CM

## 2014-11-28 LAB — POCT INR: INR: 2.2

## 2014-12-01 ENCOUNTER — Encounter (HOSPITAL_COMMUNITY): Payer: BLUE CROSS/BLUE SHIELD

## 2014-12-03 ENCOUNTER — Encounter (HOSPITAL_COMMUNITY): Payer: BLUE CROSS/BLUE SHIELD

## 2014-12-05 ENCOUNTER — Encounter (HOSPITAL_COMMUNITY): Payer: BLUE CROSS/BLUE SHIELD

## 2014-12-26 ENCOUNTER — Ambulatory Visit (INDEPENDENT_AMBULATORY_CARE_PROVIDER_SITE_OTHER): Payer: BLUE CROSS/BLUE SHIELD | Admitting: *Deleted

## 2014-12-26 DIAGNOSIS — Z954 Presence of other heart-valve replacement: Secondary | ICD-10-CM | POA: Diagnosis not present

## 2014-12-26 DIAGNOSIS — Z952 Presence of prosthetic heart valve: Secondary | ICD-10-CM

## 2014-12-26 DIAGNOSIS — I719 Aortic aneurysm of unspecified site, without rupture: Secondary | ICD-10-CM

## 2014-12-26 DIAGNOSIS — Z5181 Encounter for therapeutic drug level monitoring: Secondary | ICD-10-CM | POA: Diagnosis not present

## 2014-12-26 LAB — POCT INR: INR: 2.4

## 2015-01-01 ENCOUNTER — Ambulatory Visit (INDEPENDENT_AMBULATORY_CARE_PROVIDER_SITE_OTHER): Payer: BLUE CROSS/BLUE SHIELD | Admitting: Cardiovascular Disease

## 2015-01-01 ENCOUNTER — Encounter: Payer: Self-pay | Admitting: Cardiovascular Disease

## 2015-01-01 VITALS — BP 124/88 | HR 58 | Ht 70.0 in | Wt 217.0 lb

## 2015-01-01 DIAGNOSIS — I719 Aortic aneurysm of unspecified site, without rupture: Secondary | ICD-10-CM

## 2015-01-01 DIAGNOSIS — I428 Other cardiomyopathies: Secondary | ICD-10-CM

## 2015-01-01 DIAGNOSIS — I5022 Chronic systolic (congestive) heart failure: Secondary | ICD-10-CM

## 2015-01-01 DIAGNOSIS — Z952 Presence of prosthetic heart valve: Secondary | ICD-10-CM

## 2015-01-01 DIAGNOSIS — I429 Cardiomyopathy, unspecified: Secondary | ICD-10-CM

## 2015-01-01 DIAGNOSIS — Z954 Presence of other heart-valve replacement: Secondary | ICD-10-CM | POA: Diagnosis not present

## 2015-01-01 NOTE — Progress Notes (Signed)
Cardiology Office Note   Date:  01/01/2015   ID:  Jeffrey [name], DOB September 20, 1965, MRN [id number]  PCP:  Pearla Dubonnet, MD  Cardiologist:  [name], MD    Chief Complaint  Patient presents with  . Follow-up    aortic valve disease     History of Present Illness: Jeffrey [name] is a 49 y.o. male who presents for follow-up of aortic valve disease. He was initially hospitalized in November 2015 with congestive heart failure. The patient was diagnosed with severe aortic valve insufficiency in the setting of an ascending thoracic aortic aneurysm. He underwent Bentall procedure with aortic root replacement using a mechanical valve-conduit and coronary artery reimplantation (St. Jude 27 mm mechanical valve). His ascending aorta was replaced to the proximal arch with a 30 mm Dacron graft. A routine postoperative echo at the time of outpatient follow-up in December 2015 showed a large pericardial effusion. He underwent a subxiphoid pericardial window without complication. He had a follow-up echocardiogram in February 2016 showing improved LV function and only trivial effusion.    the patient is doing well. He has no complaints today. He specifically denies chest pain, shortness of breath, edema, or heart palpitations. He recently traveled to [place] without problems. He is tolerating warfarin without bleeding issues.  Past Medical History  Diagnosis Date  . Aortic insufficiency     a. 02/2014 - admx with sx severe AI and assoc ascending aortic aneurysm (6.8 cm) >> s/p Bentall with mechancial AVR (Dr. Donata Clay)  . Thoracic ascending aortic aneurysm     Echo 11/15: severe AI, severely dilated ascending aorta (6.5 cm);  s/p Bentall 02/2014 with aortic root replacement using a mechanical valve-conduit and reimplantation of coronary arteries (St. Jude 27 mm valve), replacement of ascending aorta to the proximal arch with a 30 mm Dacron graft  . Pericardial effusion     large  pericardial effusion post Bentall procedure >> s/p pericardial window  . Hx of echocardiogram     a. Echo 11/15: EF 55%, severe AI, marked dilation of ascending aorta; b. Echo 12/15: EF 30-35%, large effusion; c. Echo 2/16: EF 40-45%, no RWMA, mechanical AVR ok, trivial effusion post to heart   . NICM (nonischemic cardiomyopathy)     a. TEE (11/15): Mild LVH, EF 45-50%, diffuse HK, prominent apical trabeculations, severe AI, severely dilated ascending aorta (6.9 cm), mild MR;  b. EF 30-35% post AVR >> c. EF 40-45% by echo 05/2014  . Hx of cardiac catheterization     a. LHC (11/15): EF 55%, marked ascending aortic dilatation with severe AI on aortic root angiography, normal coronary arteries    Past Surgical History  Procedure Laterality Date  . Tee without cardioversion N/A 02/20/2014    Procedure: TRANSESOPHAGEAL ECHOCARDIOGRAM (TEE);  Surgeon: Lars Masson, MD;  Location: [facility] ENDOSCOPY;  Service: Cardiovascular;  Laterality: N/AZachary George procedure N/A 02/24/2014    Procedure: BENTALL PROCEDURE;  Surgeon: Kerin Perna, MD;  Location: [facility] OR;  Service: Open Heart Surgery;  Laterality: N/A;  . Intraoperative transesophageal echocardiogram N/A 02/24/2014    Procedure: INTRAOPERATIVE TRANSESOPHAGEAL ECHOCARDIOGRAM;  Surgeon: Kerin Perna, MD;  Location: [facility] OR;  Service: Open Heart Surgery;  Laterality: N/A;  . Left and right heart catheterization with coronary angiogram N/A 02/20/2014    Procedure: LEFT AND RIGHT HEART CATHETERIZATION WITH CORONARY ANGIOGRAM;  Surgeon: Micheline Chapman, MD;  Location: [facility] CATH LAB;  Service: Cardiovascular;  Laterality: N/A;  . Subxyphoid pericardial window N/A 04/02/2014  Procedure: SUBXYPHOID PERICARDIAL WINDOW;  Surgeon: Kerin Perna, MD;  Location: [facility] OR;  Service: Thoracic;  Laterality: N/A;  . Tee without cardioversion N/A 04/02/2014    Procedure: TRANSESOPHAGEAL ECHOCARDIOGRAM (TEE);  Surgeon: Kerin Perna, MD;  Location: [facility] OR;  Service:  Thoracic;  Laterality: N/A;    Current Outpatient Prescriptions  Medication Sig Dispense Refill  . acetaminophen (TYLENOL) 500 MG tablet Take 500 mg by mouth every 6 (six) hours as needed for mild pain or fever.     Marland Kitchen aspirin 81 MG EC tablet Take 1 tablet (81 mg total) by mouth daily.    . carvedilol (COREG) 12.5 MG tablet Take 1 tablet (12.5 mg total) by mouth 2 (two) times daily. 60 tablet 11  . lisinopril (PRINIVIL,ZESTRIL) 5 MG tablet Take 1 tablet (5 mg total) by mouth 2 (two) times daily. 60 tablet 11  . warfarin (COUMADIN) 7.5 MG tablet Take 1 tablet (7.5 mg total) by mouth as directed. 40 tablet 3  . zolpidem (AMBIEN) 5 MG tablet Take 5 mg by mouth at bedtime as needed for sleep.     No current facility-administered medications for this visit.    Allergies:   Review of patient's allergies indicates no known allergies.   Social History:  The patient  reports that he has never smoked. He does not have any smokeless tobacco history on file. He reports that he does not drink alcohol or use illicit drugs.   Family History:  The patient's  family history includes Cancer in his maternal grandmother. There is no history of Heart attack or Stroke.    ROS:  Please see the history of present illness. All other systems are reviewed and negative.   PHYSICAL EXAM: VS:  BP 124/88 mmHg  Pulse 58  Ht  (1.778 m)  Wt 217 lb (98.431 kg)  BMI 31.14 kg/m2 , BMI Body mass index is 31.14 kg/(m^2). GEN: Well nourished, well developed, in no acute distress HEENT: normal Neck: no JVD, no masses. No carotid bruits Cardiac: RRR with  Soft systolic ejection murmur at the right upper sternal border, normal mechanical A2.        Respiratory:  clear to auscultation bilaterally, normal work of breathing GI: soft, nontender, nondistended, + BS MS: no deformity or atrophy Ext: no pretibial edema, pedal pulses 2+= bilaterally Skin: warm and dry, no rash Neuro:  Strength and sensation are  intact Psych: euthymic mood, full affect  EKG:  EKG is ordered today. The ekg ordered today shows  Sinus bradycardia 58 bpm , ST and T wave abnormality consider lateral ischemia.  Recent Labs: 02/18/2014: Pro B Natriuretic peptide (BNP) 2601.0*; TSH 2.090 02/25/2014: Magnesium 2.2 07/21/2014: ALT 29; BUN 9; Creatinine, Ser 0.79; Hemoglobin 13.1; Platelets 180; Potassium 4.5; Sodium 136   Lipid Panel  No results found for: CHOL, TRIG, HDL, CHOLHDL, VLDL, LDLCALC, LDLDIRECT    Wt Readings from Last 3 Encounters:  01/01/15 217 lb (98.431 kg)  10/10/14 212 lb (96.163 kg)  10/08/14 213 lb 8 oz (96.843 kg)     Cardiac Studies Reviewed: CT Angio June 2016: FINDINGS: The lungs are well aerated bilaterally. No focal infiltrate or sizable effusion is seen. No parenchymal nodules are seen.  The thoracic aorta demonstrates evidence of aortic root replacement with aortic valve replacement. Coronary arteries are patent. Some postoperative scarring surrounding the aortic root is noted. No sizable pericardial effusion is seen. No evidence of pulmonary embolism is noted. The left vertebral artery arises directly from  the aorta. No hilar or mediastinal adenopathy is noted.  The visualized upper abdomen is within normal limits. No acute bony abnormality is seen. Median sternotomy is noted.  Review of the MIP images confirms the above findings.  IMPRESSION: Changes consistent with aortic root and aortic valve replacement. No acute abnormality is noted.  ASSESSMENT AND PLAN: 1.   Chronic systolic heart failure, New York Heart Association functional class I: Patient is doing well on his current medical program with carvedilol and lisinopril. He has continued to have moderate LV dysfunction. Now that he has been on medical therapy for greater than 6 months, will repeat his echo study. I will see him back in 6 months for follow-up.  2.  Status post mechanical aortic valve replacement: he  follows SBE prophylaxis. He is tolerating warfarin and low-dose aspirin without bleeding problems. Echo to be updated.   3. Pericardial effusion : No recurrence after subxiphoid pericardial window. We'll reassess with echocardiography.   Current medicines are reviewed with the patient today.  The patient does not have concerns regarding medicines.  Labs/ tests ordered today include:   Orders Placed This Encounter  Procedures  . EKG 12-Lead  . Echocardiogram    Disposition:   FU 6 months  Signed, [name], MD  01/01/2015 6:12 PM    [facility] [address] [place], [place], [place]  [address] Phone: [phone]; Fax: [phone]

## 2015-01-01 NOTE — Patient Instructions (Addendum)
Medication Instructions:   Your physician recommends that you continue on your current medications as directed. Please refer to the Current Medication list given to you today.   Labwork: NONE ORDER TODAY    Testing/Procedures:   Your physician has requested that you have an echocardiogram. Echocardiography is a painless test that uses sound waves to create images of your heart. It provides your doctor with information about the size and shape of your heart and how well your heart's chambers and valves are working. This procedure takes approximately one hour. There are no restrictions for this procedure.   Follow-Up:  Your physician wants you to follow-up in:  IN  6  MONTHS WITH DR Theodoro Parma will receive a reminder letter in the mail two months in advance. If you don't receive a letter, please call our office to schedule the follow-up appointment.      Any Other Special Instructions Will Be Listed Below (If Applicable).

## 2015-01-23 ENCOUNTER — Ambulatory Visit (INDEPENDENT_AMBULATORY_CARE_PROVIDER_SITE_OTHER): Payer: BLUE CROSS/BLUE SHIELD | Admitting: *Deleted

## 2015-01-23 DIAGNOSIS — Z954 Presence of other heart-valve replacement: Secondary | ICD-10-CM | POA: Diagnosis not present

## 2015-01-23 DIAGNOSIS — Z5181 Encounter for therapeutic drug level monitoring: Secondary | ICD-10-CM

## 2015-01-23 DIAGNOSIS — I719 Aortic aneurysm of unspecified site, without rupture: Secondary | ICD-10-CM

## 2015-01-23 DIAGNOSIS — Z952 Presence of prosthetic heart valve: Secondary | ICD-10-CM

## 2015-01-23 LAB — POCT INR: INR: 2.4

## 2015-01-23 MED ORDER — WARFARIN SODIUM 7.5 MG PO TABS: 7.5000 mg | ORAL_TABLET | ORAL | Status: DC

## 2015-02-27 ENCOUNTER — Ambulatory Visit (INDEPENDENT_AMBULATORY_CARE_PROVIDER_SITE_OTHER): Payer: BLUE CROSS/BLUE SHIELD | Admitting: *Deleted

## 2015-02-27 DIAGNOSIS — Z954 Presence of other heart-valve replacement: Secondary | ICD-10-CM | POA: Diagnosis not present

## 2015-02-27 DIAGNOSIS — Z952 Presence of prosthetic heart valve: Secondary | ICD-10-CM

## 2015-02-27 DIAGNOSIS — Z5181 Encounter for therapeutic drug level monitoring: Secondary | ICD-10-CM

## 2015-02-27 DIAGNOSIS — I719 Aortic aneurysm of unspecified site, without rupture: Secondary | ICD-10-CM | POA: Diagnosis not present

## 2015-02-27 LAB — POCT INR: INR: 1.9

## 2015-04-03 ENCOUNTER — Ambulatory Visit (INDEPENDENT_AMBULATORY_CARE_PROVIDER_SITE_OTHER): Payer: BLUE CROSS/BLUE SHIELD | Admitting: Surgery

## 2015-04-03 DIAGNOSIS — Z952 Presence of prosthetic heart valve: Secondary | ICD-10-CM

## 2015-04-03 DIAGNOSIS — Z954 Presence of other heart-valve replacement: Secondary | ICD-10-CM

## 2015-04-03 DIAGNOSIS — I719 Aortic aneurysm of unspecified site, without rupture: Secondary | ICD-10-CM

## 2015-04-03 DIAGNOSIS — Z5181 Encounter for therapeutic drug level monitoring: Secondary | ICD-10-CM | POA: Diagnosis not present

## 2015-04-03 LAB — POCT INR: INR: 1.9

## 2015-04-17 ENCOUNTER — Ambulatory Visit (INDEPENDENT_AMBULATORY_CARE_PROVIDER_SITE_OTHER): Payer: BLUE CROSS/BLUE SHIELD | Admitting: Pharmacist

## 2015-04-17 DIAGNOSIS — I719 Aortic aneurysm of unspecified site, without rupture: Secondary | ICD-10-CM

## 2015-04-17 DIAGNOSIS — Z5181 Encounter for therapeutic drug level monitoring: Secondary | ICD-10-CM | POA: Diagnosis not present

## 2015-04-17 DIAGNOSIS — Z954 Presence of other heart-valve replacement: Secondary | ICD-10-CM

## 2015-04-17 DIAGNOSIS — Z952 Presence of prosthetic heart valve: Secondary | ICD-10-CM

## 2015-04-17 LAB — POCT INR: INR: 1.5

## 2015-04-24 ENCOUNTER — Other Ambulatory Visit: Payer: Self-pay | Admitting: *Deleted

## 2015-04-24 ENCOUNTER — Ambulatory Visit (INDEPENDENT_AMBULATORY_CARE_PROVIDER_SITE_OTHER): Payer: BLUE CROSS/BLUE SHIELD | Admitting: Pharmacist

## 2015-04-24 DIAGNOSIS — I719 Aortic aneurysm of unspecified site, without rupture: Secondary | ICD-10-CM | POA: Diagnosis not present

## 2015-04-24 DIAGNOSIS — Z954 Presence of other heart-valve replacement: Secondary | ICD-10-CM

## 2015-04-24 DIAGNOSIS — I428 Other cardiomyopathies: Secondary | ICD-10-CM

## 2015-04-24 DIAGNOSIS — Z5181 Encounter for therapeutic drug level monitoring: Secondary | ICD-10-CM | POA: Diagnosis not present

## 2015-04-24 DIAGNOSIS — Z952 Presence of prosthetic heart valve: Secondary | ICD-10-CM

## 2015-04-24 LAB — POCT INR: INR: 2.7

## 2015-04-24 MED ORDER — LISINOPRIL 5 MG PO TABS: 5.0000 mg | ORAL_TABLET | Freq: Two times a day (BID) | ORAL | Status: DC

## 2015-05-15 ENCOUNTER — Ambulatory Visit (INDEPENDENT_AMBULATORY_CARE_PROVIDER_SITE_OTHER): Payer: BLUE CROSS/BLUE SHIELD | Admitting: *Deleted

## 2015-05-15 ENCOUNTER — Other Ambulatory Visit: Payer: Self-pay | Admitting: Cardiovascular Disease

## 2015-05-15 DIAGNOSIS — Z954 Presence of other heart-valve replacement: Secondary | ICD-10-CM

## 2015-05-15 DIAGNOSIS — Z5181 Encounter for therapeutic drug level monitoring: Secondary | ICD-10-CM | POA: Diagnosis not present

## 2015-05-15 DIAGNOSIS — I351 Nonrheumatic aortic (valve) insufficiency: Secondary | ICD-10-CM

## 2015-05-15 DIAGNOSIS — Z952 Presence of prosthetic heart valve: Secondary | ICD-10-CM

## 2015-05-15 DIAGNOSIS — I719 Aortic aneurysm of unspecified site, without rupture: Secondary | ICD-10-CM

## 2015-05-15 LAB — POCT INR: INR: 2.5

## 2015-05-15 MED ORDER — CARVEDILOL 12.5 MG PO TABS: 12.5000 mg | ORAL_TABLET | Freq: Two times a day (BID) | ORAL | Status: DC

## 2015-05-20 ENCOUNTER — Other Ambulatory Visit: Payer: Self-pay | Admitting: Cardiovascular Disease

## 2015-06-05 ENCOUNTER — Other Ambulatory Visit (HOSPITAL_COMMUNITY): Payer: BLUE CROSS/BLUE SHIELD

## 2015-06-12 ENCOUNTER — Ambulatory Visit (INDEPENDENT_AMBULATORY_CARE_PROVIDER_SITE_OTHER): Payer: BLUE CROSS/BLUE SHIELD | Admitting: *Deleted

## 2015-06-12 DIAGNOSIS — Z5181 Encounter for therapeutic drug level monitoring: Secondary | ICD-10-CM

## 2015-06-12 DIAGNOSIS — I719 Aortic aneurysm of unspecified site, without rupture: Secondary | ICD-10-CM | POA: Diagnosis not present

## 2015-06-12 DIAGNOSIS — Z952 Presence of prosthetic heart valve: Secondary | ICD-10-CM

## 2015-06-12 DIAGNOSIS — Z954 Presence of other heart-valve replacement: Secondary | ICD-10-CM

## 2015-06-12 LAB — POCT INR: INR: 2.1

## 2015-06-16 ENCOUNTER — Other Ambulatory Visit: Payer: Self-pay | Admitting: *Deleted

## 2015-06-16 ENCOUNTER — Other Ambulatory Visit: Payer: Self-pay

## 2015-06-16 DIAGNOSIS — I351 Nonrheumatic aortic (valve) insufficiency: Secondary | ICD-10-CM

## 2015-06-16 DIAGNOSIS — Z952 Presence of prosthetic heart valve: Secondary | ICD-10-CM

## 2015-06-16 DIAGNOSIS — I428 Other cardiomyopathies: Secondary | ICD-10-CM

## 2015-06-16 MED ORDER — LISINOPRIL 5 MG PO TABS: 5.0000 mg | ORAL_TABLET | Freq: Two times a day (BID) | ORAL | Status: DC

## 2015-06-16 MED ORDER — CARVEDILOL 12.5 MG PO TABS: 12.5000 mg | ORAL_TABLET | Freq: Two times a day (BID) | ORAL | Status: DC

## 2015-06-16 MED ORDER — WARFARIN SODIUM 7.5 MG PO TABS: 7.5000 mg | ORAL_TABLET | Freq: Every day | ORAL | Status: DC

## 2015-06-29 ENCOUNTER — Encounter: Payer: Self-pay | Admitting: Cardiovascular Disease

## 2015-06-29 ENCOUNTER — Ambulatory Visit (HOSPITAL_COMMUNITY): Payer: BLUE CROSS/BLUE SHIELD | Attending: Cardiology

## 2015-06-29 ENCOUNTER — Other Ambulatory Visit: Payer: Self-pay

## 2015-06-29 DIAGNOSIS — I5022 Chronic systolic (congestive) heart failure: Secondary | ICD-10-CM | POA: Diagnosis not present

## 2015-06-29 DIAGNOSIS — I5021 Acute systolic (congestive) heart failure: Secondary | ICD-10-CM | POA: Diagnosis present

## 2015-06-29 DIAGNOSIS — Z953 Presence of xenogenic heart valve: Secondary | ICD-10-CM | POA: Insufficient documentation

## 2015-06-29 DIAGNOSIS — I351 Nonrheumatic aortic (valve) insufficiency: Secondary | ICD-10-CM | POA: Diagnosis not present

## 2015-07-02 ENCOUNTER — Telehealth: Payer: Self-pay | Admitting: Cardiovascular Disease

## 2015-07-02 DIAGNOSIS — I428 Other cardiomyopathies: Secondary | ICD-10-CM

## 2015-07-02 DIAGNOSIS — I351 Nonrheumatic aortic (valve) insufficiency: Secondary | ICD-10-CM

## 2015-07-02 DIAGNOSIS — Z952 Presence of prosthetic heart valve: Secondary | ICD-10-CM

## 2015-07-02 MED ORDER — CARVEDILOL 12.5 MG PO TABS: 12.5000 mg | ORAL_TABLET | Freq: Two times a day (BID) | ORAL | Status: DC

## 2015-07-02 MED ORDER — LISINOPRIL 5 MG PO TABS: 5.0000 mg | ORAL_TABLET | Freq: Two times a day (BID) | ORAL | Status: DC

## 2015-07-02 NOTE — Telephone Encounter (Signed)
°  New Prob   Requesting new prescriptions, 90 day supply of the following medications:  Lisinopril 5 mg Carvedilol 12.5 mg  Listed as [facility]  Address:  [address] [place] [address]  Listed with Fax number [phone]

## 2015-07-02 NOTE — Telephone Encounter (Signed)
Sent 90 day supply to patient's pharmacy of choice.

## 2015-07-06 ENCOUNTER — Encounter: Payer: Self-pay | Admitting: Cardiovascular Disease

## 2015-07-06 ENCOUNTER — Ambulatory Visit (INDEPENDENT_AMBULATORY_CARE_PROVIDER_SITE_OTHER): Payer: BLUE CROSS/BLUE SHIELD | Admitting: Cardiovascular Disease

## 2015-07-06 VITALS — BP 138/70 | HR 68 | Ht 70.0 in | Wt 225.8 lb

## 2015-07-06 DIAGNOSIS — Z952 Presence of prosthetic heart valve: Secondary | ICD-10-CM

## 2015-07-06 DIAGNOSIS — Z954 Presence of other heart-valve replacement: Secondary | ICD-10-CM | POA: Diagnosis not present

## 2015-07-06 NOTE — Patient Instructions (Signed)

## 2015-07-06 NOTE — Progress Notes (Signed)
Cardiology Office Note Date:  07/06/2015   ID:  Jeffrey [name], DOB 01-31-1966, MRN [id number]  PCP:  Pearla Dubonnet, MD  Cardiologist:  [name], MD    Chief Complaint  Patient presents with  . Aortic Insuffiency    denies any sob, cp, le edema, or claudication   History of Present Illness: Jeffrey [name] is a 50 y.o. male who presents for follow-up of aortic valve disease. He was initially hospitalized in November 2015 with congestive heart failure. The patient was diagnosed with severe aortic valve insufficiency in the setting of an ascending thoracic aortic aneurysm. He underwent Bentall procedure with aortic root replacement using a mechanical valve-conduit and coronary artery reimplantation (St. Jude 27 mm mechanical valve). His ascending aorta was replaced to the proximal arch with a 30 mm Dacron graft. A routine postoperative echo at the time of outpatient follow-up in December 2015 showed a large pericardial effusion. He underwent a subxiphoid pericardial window without complication. He had a follow-up echocardiogram in 2016 showing improved LV function and only trivial effusion. His most recent echo 06/29/2015 showed normal LV systolic function and normal function of his mechanical aortic valve prosthesis.  The patient is doing well from a cardiac perspective. He has some problems with insomnia. Denies shortness of breath, chest pain, heart palpitations, edema, orthopnea, or PND. Denies any bleeding problems on chronic warfarin.  Past Medical History  Diagnosis Date  . Aortic insufficiency     a. 02/2014 - admx with sx severe AI and assoc ascending aortic aneurysm (6.8 cm) >> s/p Bentall with mechancial AVR (Dr. Donata Clay)  . Thoracic ascending aortic aneurysm (HCC)     Echo 11/15: severe AI, severely dilated ascending aorta (6.5 cm);  s/p Bentall 02/2014 with aortic root replacement using a mechanical valve-conduit and reimplantation of coronary arteries (St.  Jude 27 mm valve), replacement of ascending aorta to the proximal arch with a 30 mm Dacron graft  . Pericardial effusion     large pericardial effusion post Bentall procedure >> s/p pericardial window  . Hx of echocardiogram     a. Echo 11/15: EF 55%, severe AI, marked dilation of ascending aorta; b. Echo 12/15: EF 30-35%, large effusion; c. Echo 2/16: EF 40-45%, no RWMA, mechanical AVR ok, trivial effusion post to heart   . NICM (nonischemic cardiomyopathy) (HCC)     a. TEE (11/15): Mild LVH, EF 45-50%, diffuse HK, prominent apical trabeculations, severe AI, severely dilated ascending aorta (6.9 cm), mild MR;  b. EF 30-35% post AVR >> c. EF 40-45% by echo 05/2014  . Hx of cardiac catheterization     a. LHC (11/15): EF 55%, marked ascending aortic dilatation with severe AI on aortic root angiography, normal coronary arteries    Past Surgical History  Procedure Laterality Date  . Tee without cardioversion N/A 02/20/2014    Procedure: TRANSESOPHAGEAL ECHOCARDIOGRAM (TEE);  Surgeon: Lars Masson, MD;  Location: [facility] ENDOSCOPY;  Service: Cardiovascular;  Laterality: N/AZachary George procedure N/A 02/24/2014    Procedure: BENTALL PROCEDURE;  Surgeon: Kerin Perna, MD;  Location: [facility] OR;  Service: Open Heart Surgery;  Laterality: N/A;  . Intraoperative transesophageal echocardiogram N/A 02/24/2014    Procedure: INTRAOPERATIVE TRANSESOPHAGEAL ECHOCARDIOGRAM;  Surgeon: Kerin Perna, MD;  Location: [facility] OR;  Service: Open Heart Surgery;  Laterality: N/A;  . Left and right heart catheterization with coronary angiogram N/A 02/20/2014    Procedure: LEFT AND RIGHT HEART CATHETERIZATION WITH CORONARY ANGIOGRAM;  Surgeon: Micheline Chapman, MD;  Location: [facility] CATH LAB;  Service: Cardiovascular;  Laterality: N/A;  . Subxyphoid pericardial window N/A 04/02/2014    Procedure: SUBXYPHOID PERICARDIAL WINDOW;  Surgeon: Kerin Perna, MD;  Location: [facility] OR;  Service: Thoracic;  Laterality: N/A;  . Tee without  cardioversion N/A 04/02/2014    Procedure: TRANSESOPHAGEAL ECHOCARDIOGRAM (TEE);  Surgeon: Kerin Perna, MD;  Location: [facility] OR;  Service: Thoracic;  Laterality: N/A;    Current Outpatient Prescriptions  Medication Sig Dispense Refill  . acetaminophen (TYLENOL) 500 MG tablet Take 500 mg by mouth every 6 (six) hours as needed for mild pain or fever.     Marland Kitchen aspirin 81 MG EC tablet Take 1 tablet (81 mg total) by mouth daily.    . carvedilol (COREG) 12.5 MG tablet Take 1 tablet (12.5 mg total) by mouth 2 (two) times daily. 180 tablet 3  . lisinopril (PRINIVIL,ZESTRIL) 5 MG tablet Take 1 tablet (5 mg total) by mouth 2 (two) times daily. 180 tablet 3  . warfarin (COUMADIN) 7.5 MG tablet Take 1 tablet (7.5 mg total) by mouth daily. 100 tablet 1  . zolpidem (AMBIEN) 5 MG tablet Take 5 mg by mouth at bedtime as needed for sleep.     No current facility-administered medications for this visit.    Allergies:   Review of patient's allergies indicates no known allergies.   Social History:  The patient  reports that he has never smoked. He does not have any smokeless tobacco history on file. He reports that he does not drink alcohol or use illicit drugs.   Family History:  The patient's  family history includes Cancer in his maternal grandmother. There is no history of Heart attack or Stroke.    ROS:  Please see the history of present illness.  Otherwise, review of systems is positive for upper respiratory sx's a few months ago with congestion, fever, cough, now resolved.  All other systems are reviewed and negative.   PHYSICAL EXAM: VS:  BP 138/70 mmHg  Pulse 68  Ht 5\' 10"  (1.778 m)  Wt 225 lb 12.8 oz (102.422 kg)  BMI 32.40 kg/m2 , BMI Body mass index is 32.4 kg/(m^2). GEN: Well nourished, well developed, in no acute distress HEENT: normal Neck: no JVD, no masses. No carotid bruits Cardiac: RRR with 2/6 systolic ejection murmur at the right upper sternal border and crisp mechanical A2, no  diastolic murmur        Respiratory:  clear to auscultation bilaterally, normal work of breathing GI: soft, nontender, nondistended, + BS MS: no deformity or atrophy Ext: no pretibial edema, pedal pulses 2+= bilaterally Skin: warm and dry, no rash Neuro:  Strength and sensation are intact Psych: euthymic mood, full affect  EKG:  EKG is ordered today. The ekg ordered today shows NSR 70 bpm, ST-T abnormality consider lateral ischemia no change from previous  Recent Labs: 07/21/2014: ALT 29; BUN 9; Creatinine, Ser 0.79; Hemoglobin 13.1; Platelets 180; Potassium 4.5; Sodium 136   Lipid Panel  No results found for: CHOL, TRIG, HDL, CHOLHDL, VLDL, LDLCALC, LDLDIRECT    Wt Readings from Last 3 Encounters:  07/06/15 225 lb 12.8 oz (102.422 kg)  01/01/15 217 lb (98.431 kg)  10/10/14 212 lb (96.163 kg)     Cardiac Studies Reviewed: 2D Echo 06/29/2015: Study Conclusions  - Left ventricle: The cavity size was normal. Wall thickness was  normal. Systolic function was normal. The estimated ejection  fraction was in the range of 60% to 65%. Wall motion was  normal;  there were no regional wall motion abnormalities. Doppler  parameters are consistent with abnormal left ventricular  relaxation (grade 1 diastolic dysfunction). Doppler parameters  are consistent with high ventricular filling pressure. - Aortic valve: A mechanical prosthesis was present. There was  trivial regurgitation. - Left atrium: The atrium was mildly dilated. - Pulmonary arteries: Systolic pressure was mildly increased. PA  peak pressure: 36 mm Hg (S).  Impressions:  - Normal LV systolic function; grade 1 diastolic dysfunction;  elevated LV filling pressure; mild LAE; mechanical aortic valve  with normal gradients and trace AI; mildly elevated pulmonary  pressure.  CT Angio June 2016: FINDINGS: The lungs are well aerated bilaterally. No focal infiltrate or sizable effusion is seen. No parenchymal  nodules are seen.  The thoracic aorta demonstrates evidence of aortic root replacement with aortic valve replacement. Coronary arteries are patent. Some postoperative scarring surrounding the aortic root is noted. No sizable pericardial effusion is seen. No evidence of pulmonary embolism is noted. The left vertebral artery arises directly from the aorta. No hilar or mediastinal adenopathy is noted.  The visualized upper abdomen is within normal limits. No acute bony abnormality is seen. Median sternotomy is noted.  Review of the MIP images confirms the above findings.  IMPRESSION: Changes consistent with aortic root and aortic valve replacement. No acute abnormality is noted.  ASSESSMENT AND PLAN: 1.  Aortic valve disease status post mechanical aortic valve replacement: The patient follows SBE prophylaxis. He is tolerating warfarin and low-dose aspirin without bleeding problems. His aortic valve prosthesis is functioning normally by recent echo which was reviewed with him today.  2. Chronic systolic heart failure, New York Heart Association functional class I: The patient's echo shows complete normalization of LV function. He will continue on his current medical program which includes carvedilol and lisinopril. This is an excellent prognostic sign considering the presence of severe LV dysfunction in the setting of severe aortic insufficiency at the time of his initial diagnosis.  3. Use of a chronic anticoagulant drug: The patient is tolerating warfarin well. There has been some fluctuation in his INRs but he has highly compliant. He will continue to follow with our Coumadin clinic.  Overall the patient is doing very well. I have reviewed his imaging studies that are outlined above (CTA and echo) as part of today's evaluation. He has gained weight over the last year and we reviewed the importance of regular exercise and diet.  Current medicines are reviewed with the patient today.  The  patient does not have concerns regarding medicines.  Labs/ tests ordered today include:   Orders Placed This Encounter  Procedures  . EKG 12-Lead    Disposition:   FU 6 months  Signed, [name], MD  07/06/2015 12:39 PM    [facility] [address] [place], [place], [place]  [address] Phone: [phone]; Fax: [phone]

## 2015-07-17 ENCOUNTER — Ambulatory Visit (INDEPENDENT_AMBULATORY_CARE_PROVIDER_SITE_OTHER): Payer: BLUE CROSS/BLUE SHIELD | Admitting: *Deleted

## 2015-07-17 DIAGNOSIS — Z5181 Encounter for therapeutic drug level monitoring: Secondary | ICD-10-CM

## 2015-07-17 DIAGNOSIS — Z954 Presence of other heart-valve replacement: Secondary | ICD-10-CM | POA: Diagnosis not present

## 2015-07-17 DIAGNOSIS — Z952 Presence of prosthetic heart valve: Secondary | ICD-10-CM

## 2015-07-17 DIAGNOSIS — I719 Aortic aneurysm of unspecified site, without rupture: Secondary | ICD-10-CM

## 2015-07-17 LAB — POCT INR: INR: 2.1

## 2015-08-28 ENCOUNTER — Ambulatory Visit (INDEPENDENT_AMBULATORY_CARE_PROVIDER_SITE_OTHER): Payer: BLUE CROSS/BLUE SHIELD | Admitting: *Deleted

## 2015-08-28 DIAGNOSIS — Z5181 Encounter for therapeutic drug level monitoring: Secondary | ICD-10-CM

## 2015-08-28 DIAGNOSIS — I719 Aortic aneurysm of unspecified site, without rupture: Secondary | ICD-10-CM | POA: Diagnosis not present

## 2015-08-28 DIAGNOSIS — Z954 Presence of other heart-valve replacement: Secondary | ICD-10-CM

## 2015-08-28 DIAGNOSIS — Z952 Presence of prosthetic heart valve: Secondary | ICD-10-CM

## 2015-08-28 LAB — POCT INR: INR: 2.7

## 2015-09-25 ENCOUNTER — Ambulatory Visit (INDEPENDENT_AMBULATORY_CARE_PROVIDER_SITE_OTHER): Payer: BLUE CROSS/BLUE SHIELD | Admitting: *Deleted

## 2015-09-25 DIAGNOSIS — Z952 Presence of prosthetic heart valve: Secondary | ICD-10-CM

## 2015-09-25 DIAGNOSIS — Z954 Presence of other heart-valve replacement: Secondary | ICD-10-CM

## 2015-09-25 DIAGNOSIS — Z5181 Encounter for therapeutic drug level monitoring: Secondary | ICD-10-CM

## 2015-09-25 DIAGNOSIS — I719 Aortic aneurysm of unspecified site, without rupture: Secondary | ICD-10-CM

## 2015-09-25 LAB — POCT INR: INR: 2

## 2015-11-02 ENCOUNTER — Other Ambulatory Visit: Payer: Self-pay | Admitting: Cardiovascular Disease

## 2015-11-13 ENCOUNTER — Ambulatory Visit (INDEPENDENT_AMBULATORY_CARE_PROVIDER_SITE_OTHER): Payer: Managed Care, Other (non HMO)

## 2015-11-13 DIAGNOSIS — I719 Aortic aneurysm of unspecified site, without rupture: Secondary | ICD-10-CM | POA: Diagnosis not present

## 2015-11-13 DIAGNOSIS — Z5181 Encounter for therapeutic drug level monitoring: Secondary | ICD-10-CM

## 2015-11-13 DIAGNOSIS — Z954 Presence of other heart-valve replacement: Secondary | ICD-10-CM | POA: Diagnosis not present

## 2015-11-13 DIAGNOSIS — Z952 Presence of prosthetic heart valve: Secondary | ICD-10-CM

## 2015-11-13 LAB — POCT INR: INR: 2.3

## 2015-11-13 MED ORDER — WARFARIN SODIUM 7.5 MG PO TABS: ORAL_TABLET | ORAL | 1 refills | Status: DC

## 2015-11-16 ENCOUNTER — Other Ambulatory Visit: Payer: Self-pay | Admitting: *Deleted

## 2015-11-16 DIAGNOSIS — Z952 Presence of prosthetic heart valve: Secondary | ICD-10-CM

## 2015-11-16 DIAGNOSIS — I428 Other cardiomyopathies: Secondary | ICD-10-CM

## 2015-11-16 DIAGNOSIS — I351 Nonrheumatic aortic (valve) insufficiency: Secondary | ICD-10-CM

## 2015-11-16 MED ORDER — LISINOPRIL 5 MG PO TABS: 5.0000 mg | ORAL_TABLET | Freq: Two times a day (BID) | ORAL | 3 refills | Status: DC

## 2015-11-16 MED ORDER — CARVEDILOL 12.5 MG PO TABS: 12.5000 mg | ORAL_TABLET | Freq: Two times a day (BID) | ORAL | 3 refills | Status: DC

## 2015-12-15 ENCOUNTER — Telehealth: Payer: Self-pay | Admitting: Cardiovascular Disease

## 2015-12-15 NOTE — Telephone Encounter (Signed)
New message   Pt verbalized that he wants appt on 12-25-15 same day as coumadin with Dr.Cooper

## 2015-12-15 NOTE — Telephone Encounter (Signed)
Attempted to call pt back x 2 and received message that number can not be completed as dialed.  Will try again later.

## 2015-12-15 NOTE — Telephone Encounter (Signed)
Left message to call back  

## 2015-12-15 NOTE — Telephone Encounter (Signed)
Spoke with pt and advised him that Dr. [name] is not in the office on 9/15, but is on 9/14 but at this time I do not see any open slots.  Advised pt I would have to send message to see about getting him in.  Pt agreeable to this plan and agreeable to CVRR appt being changed to 9/14 if we are able to get him in to see Dr. [name].  Advised once we hear back from Dr. [name] we will call back.  Pt appreciative for assistance.

## 2015-12-15 NOTE — Telephone Encounter (Signed)
Pt returned call.  Scheduled pt for 9/14 to see Dr. [name] and CVRR clinic.

## 2015-12-15 NOTE — Telephone Encounter (Signed)
Sure that's fine thanks 

## 2015-12-15 NOTE — Telephone Encounter (Signed)
F/u Message ° °Pt returning call. Please call back to discuss  °

## 2015-12-15 NOTE — Telephone Encounter (Signed)
New message    Patient calling back to speak with triage nurse from today.

## 2015-12-17 ENCOUNTER — Encounter: Payer: Self-pay | Admitting: Internal Medicine

## 2015-12-22 ENCOUNTER — Encounter: Payer: Self-pay | Admitting: Cardiovascular Disease

## 2015-12-24 ENCOUNTER — Ambulatory Visit (INDEPENDENT_AMBULATORY_CARE_PROVIDER_SITE_OTHER): Payer: BLUE CROSS/BLUE SHIELD | Admitting: *Deleted

## 2015-12-24 ENCOUNTER — Encounter: Payer: Self-pay | Admitting: Cardiovascular Disease

## 2015-12-24 ENCOUNTER — Ambulatory Visit (INDEPENDENT_AMBULATORY_CARE_PROVIDER_SITE_OTHER): Payer: BLUE CROSS/BLUE SHIELD | Admitting: Cardiovascular Disease

## 2015-12-24 VITALS — BP 120/74 | HR 64 | Ht 70.0 in | Wt 221.0 lb

## 2015-12-24 DIAGNOSIS — I429 Cardiomyopathy, unspecified: Secondary | ICD-10-CM

## 2015-12-24 DIAGNOSIS — I351 Nonrheumatic aortic (valve) insufficiency: Secondary | ICD-10-CM | POA: Diagnosis not present

## 2015-12-24 DIAGNOSIS — Z954 Presence of other heart-valve replacement: Secondary | ICD-10-CM | POA: Diagnosis not present

## 2015-12-24 DIAGNOSIS — Z5181 Encounter for therapeutic drug level monitoring: Secondary | ICD-10-CM

## 2015-12-24 DIAGNOSIS — I428 Other cardiomyopathies: Secondary | ICD-10-CM

## 2015-12-24 DIAGNOSIS — I719 Aortic aneurysm of unspecified site, without rupture: Secondary | ICD-10-CM | POA: Diagnosis not present

## 2015-12-24 DIAGNOSIS — Z952 Presence of prosthetic heart valve: Secondary | ICD-10-CM

## 2015-12-24 LAB — POCT INR: INR: 2.5

## 2015-12-24 MED ORDER — LISINOPRIL 5 MG PO TABS
5.0000 mg | ORAL_TABLET | Freq: Every day | ORAL | 3 refills | Status: DC
Start: 2015-12-24 — End: 2016-12-06

## 2015-12-24 NOTE — Patient Instructions (Signed)
Medication Instructions:  Your physician has recommended you make the following change in your medication:  1. DECREASE Lisinopril to 5mg  take one tablet by mouth daily  Labwork: No new orders.   Testing/Procedures: Your physician has requested that you have an echocardiogram in [facility] 2018. Echocardiography is a painless test that uses sound waves to create images of your heart. It provides your doctor with information about the size and shape of your heart and how well your heart's chambers and valves are working. This procedure takes approximately one hour. There are no restrictions for this procedure.  Follow-Up: Your physician wants you to follow-up in: April 2018 with Dr [name].  You will receive a reminder letter in the mail two months in advance. If you don't receive a letter, please call our office to schedule the follow-up appointment.   Any Other Special Instructions Will Be Listed Below (If Applicable).     If you need a refill on your cardiac medications before your next appointment, please call your pharmacy.

## 2015-12-24 NOTE — Progress Notes (Signed)
Cardiology Office Note Date:  12/24/2015   ID:  Jeffrey [name], DOB [birth year], MRN [id number]  PCP:  Pearla Dubonnet, MD  Cardiologist:  [name], MD    Chief Complaint  Patient presents with  . Follow-up    no new sx     History of Present Illness: Jeffrey [name] is a 50 y.o. male who presents for follow-up of aortic valve disease. He was initially hospitalized in 2015 with congestive heart failure. The patient was diagnosed with severe aortic valve insufficiency in the setting of an ascending thoracic aortic aneurysm. He underwent Bentall procedure with aortic root replacement using a mechanical valve-conduit and coronary artery reimplantation (St. Jude 27 mm mechanical valve). His ascending aorta was replaced to the proximal arch with a 30 mm Dacron graft. A routine postoperative echo at the time of outpatient follow-up in  2015 showed a large pericardial effusion. He underwent a subxiphoid pericardial window without complication. He had a follow-up echocardiogram in 2016 showing improved LV function and only trivial effusion. His most recent echo 06/29/2015 showed normal LV systolic function and normal function of his mechanical aortic valve prosthesis. He was last seen 07/06/2015 at which time he was doing very well.  He brings in recent labs with a cholesterol 162, HDL, 29, Trig 195, and LDL 94 mg/dL.  The patient is doing well. He's been trying to follow a healthier diet. He has no cardiac complaints. Today, he denies symptoms of palpitations, chest pain, shortness of breath, orthopnea, PND, lower extremity edema, dizziness, or syncope.  He's tolerating warfarin fairly well.   Past Medical History:  Diagnosis Date  . Aortic insufficiency    a. 02/2014 - admx with sx severe AI and assoc ascending aortic aneurysm (6.8 cm) >> s/p Bentall with mechancial AVR (Dr. Donata Clay)  . Hx of cardiac catheterization    a. LHC (11/15): EF 55%, marked ascending aortic  dilatation with severe AI on aortic root angiography, normal coronary arteries  . Hx of echocardiogram    a. Echo 11/15: EF 55%, severe AI, marked dilation of ascending aorta; b. Echo 12/15: EF 30-35%, large effusion; c. Echo 2/16: EF 40-45%, no RWMA, mechanical AVR ok, trivial effusion post to heart   . NICM (nonischemic cardiomyopathy) (HCC)    a. TEE (11/15): Mild LVH, EF 45-50%, diffuse HK, prominent apical trabeculations, severe AI, severely dilated ascending aorta (6.9 cm), mild MR;  b. EF 30-35% post AVR >> c. EF 40-45% by echo 05/2014  . Pericardial effusion    large pericardial effusion post Bentall procedure >> s/p pericardial window  . Thoracic ascending aortic aneurysm (HCC)    Echo 11/15: severe AI, severely dilated ascending aorta (6.5 cm);  s/p Bentall 02/2014 with aortic root replacement using a mechanical valve-conduit and reimplantation of coronary arteries (St. Jude 27 mm valve), replacement of ascending aorta to the proximal arch with a 30 mm Dacron graft    Past Surgical History:  Procedure Laterality Date  . BENTALL PROCEDURE N/A 02/24/2014   Procedure: BENTALL PROCEDURE;  Surgeon: Kerin Perna, MD;  [facility] OR;  Service: Open Heart Surgery;  Laterality: N/A;  . INTRAOPERATIVE TRANSESOPHAGEAL ECHOCARDIOGRAM N/A 02/24/2014   Procedure: INTRAOPERATIVE TRANSESOPHAGEAL ECHOCARDIOGRAM;  Surgeon: Kerin Perna, MD;  Location: [facility] OR;  Service: Open Heart Surgery;  Laterality: N/A;  . LEFT AND RIGHT HEART CATHETERIZATION WITH CORONARY ANGIOGRAM N/A 02/20/2014   Procedure: LEFT AND RIGHT HEART CATHETERIZATION WITH CORONARY ANGIOGRAM;  Surgeon: Micheline Chapman, MD;  Location: [facility]  CATH LAB;  Service: Cardiovascular;  Laterality: N/A;  . SUBXYPHOID PERICARDIAL WINDOW N/A 04/02/2014   Procedure: SUBXYPHOID PERICARDIAL WINDOW;  Surgeon: Kerin Perna, MD;  Location: [facility] OR;  Service: Thoracic;  Laterality: N/A;  . TEE WITHOUT CARDIOVERSION N/A 02/20/2014   Procedure:  TRANSESOPHAGEAL ECHOCARDIOGRAM (TEE);  Surgeon: Lars Masson, MD;  Location: [facility] ENDOSCOPY;  Service: Cardiovascular;  Laterality: N/A;  . TEE WITHOUT CARDIOVERSION N/A 04/02/2014   Procedure: TRANSESOPHAGEAL ECHOCARDIOGRAM (TEE);  Surgeon: Kerin Perna, MD;  Location: [facility] OR;  Service: Thoracic;  Laterality: N/A;    Current Outpatient Prescriptions  Medication Sig Dispense Refill  . acetaminophen (TYLENOL) 500 MG tablet Take 500 mg by mouth every 6 (six) hours as needed for mild pain or fever.     Marland Kitchen aspirin 81 MG EC tablet Take 1 tablet (81 mg total) by mouth daily.    . carvedilol (COREG) 12.5 MG tablet Take 1 tablet (12.5 mg total) by mouth 2 (two) times daily. 180 tablet 3  . lisinopril (PRINIVIL,ZESTRIL) 5 MG tablet Take 1 tablet (5 mg total) by mouth 2 (two) times daily. 180 tablet 3  . warfarin (COUMADIN) 7.5 MG tablet Take as directed by [facility] 100 tablet 1  . zolpidem (AMBIEN) 10 MG tablet Take 5 mg by mouth daily.  0   No current facility-administered medications for this visit.     Allergies:   Review of patient's allergies indicates no known allergies.   Social History:  The patient  reports that he has never smoked. He has never used smokeless tobacco. He reports that he does not drink alcohol or use drugs.   Family History:  The patient's family history includes Cancer in his maternal grandmother.    ROS:  Please see the history of present illness.  All other systems are reviewed and negative.    PHYSICAL EXAM: VS:  BP 120/74   Pulse 64   Ht 5\' 10"  (1.778 m)   Wt 100.2 kg (221 lb)   SpO2 96%   BMI 31.71 kg/m  , BMI Body mass index is 31.71 kg/m. GEN: Well nourished, well developed, in no acute distress  HEENT: normal  Neck: no JVD, no masses. No carotid bruits Cardiac: RRR with a crisp mechanical A2               Respiratory:  clear to auscultation bilaterally, normal work of breathing GI: soft, nontender, nondistended, + BS MS: no deformity or  atrophy  Ext: no pretibial edema, pedal pulses 2+= bilaterally Skin: warm and dry, no rash Neuro:  Strength and sensation are intact Psych: euthymic mood, full affect  EKG:  EKG is ordered today. The ekg ordered today shows NSR 60 bpm, nonspecific ST-T abnormality  Recent Labs: No results found for requested labs within last 8760 hours.   Lipid Panel  No results found for: CHOL, TRIG, HDL, CHOLHDL, VLDL, LDLCALC, LDLDIRECT    Wt Readings from Last 3 Encounters:  12/24/15 100.2 kg (221 lb)  07/06/15 102.4 kg (225 lb 12.8 oz)  01/01/15 98.4 kg (217 lb)     Cardiac Studies Reviewed: 2D Echo 06/29/2015: Study Conclusions  - Left ventricle: The cavity size was normal. Wall thickness was  normal. Systolic function was normal. The estimated ejection  fraction was in the range of 60% to 65%. Wall motion was normal;  there were no regional wall motion abnormalities. Doppler  parameters are consistent with abnormal left ventricular  relaxation (grade 1 diastolic dysfunction). Doppler parameters  are consistent with high ventricular filling pressure. - Aortic valve: A mechanical prosthesis was present. There was  trivial regurgitation. - Left atrium: The atrium was mildly dilated. - Pulmonary arteries: Systolic pressure was mildly increased. PA  peak pressure: 36 mm Hg (S).  Impressions:  - Normal LV systolic function; grade 1 diastolic dysfunction;  elevated LV filling pressure; mild LAE; mechanical aortic valve  with normal gradients and trace AI; mildly elevated pulmonary  pressure.  CT Angio June 2016: FINDINGS: The lungs are well aerated bilaterally. No focal infiltrate or sizable effusion is seen. No parenchymal nodules are seen.  The thoracic aorta demonstrates evidence of aortic root replacement with aortic valve replacement. Coronary arteries are patent. Some postoperative scarring surrounding the aortic root is noted. No sizable pericardial effusion  is seen. No evidence of pulmonary embolism is noted. The left vertebral artery arises directly from the aorta. No hilar or mediastinal adenopathy is noted.  The visualized upper abdomen is within normal limits. No acute bony abnormality is seen. Median sternotomy is noted.  Review of the MIP images confirms the above findings.  IMPRESSION: Changes consistent with aortic root and aortic valve replacement. No acute abnormality is noted.   ASSESSMENT AND PLAN: 1.  Aortic valve disease status post mechanical aortic valve replacement: The patient continues to do well and is tolerating warfarin and low-dose aspirin without bleeding problems. SBE prophylaxis guidelines are reviewed.  2. Chronic systolic heart failure, New York Heart Association functional class I: The patient's LV function has normalized. BP runs a little low. Will continue carvedilol at the current dose and decrease lisinopril to 5 mg daily. Will repeat an echocardiogram prior to his rtn visit in 6 months.  3. Use of a chronic anticoagulant drug: Patient is tolerating warfarin well.  Current medicines are reviewed with the patient today.  The patient does not have concerns regarding medicines.  Labs/ tests ordered today include:  No orders of the defined types were placed in this encounter.  Disposition:   FU 6 months  Signed, Tonny Bollmanooper, Jamille Yoshino, MD  12/24/2015 3:47 PM    [facility] [address], [place], [place]  [address] Phone: [phone]) [phone]; Fax: [phone]

## 2015-12-30 DIAGNOSIS — Z7901 Long term (current) use of anticoagulants: Secondary | ICD-10-CM | POA: Diagnosis not present

## 2015-12-30 DIAGNOSIS — Y93H2 Activity, gardening and landscaping: Secondary | ICD-10-CM | POA: Diagnosis not present

## 2015-12-30 DIAGNOSIS — S01511A Laceration without foreign body of lip, initial encounter: Secondary | ICD-10-CM | POA: Diagnosis not present

## 2015-12-30 DIAGNOSIS — S0990XA Unspecified injury of head, initial encounter: Secondary | ICD-10-CM | POA: Diagnosis not present

## 2015-12-30 DIAGNOSIS — W19XXXA Unspecified fall, initial encounter: Secondary | ICD-10-CM | POA: Diagnosis not present

## 2015-12-30 DIAGNOSIS — Y92017 Garden or yard in single-family (private) house as the place of occurrence of the external cause: Secondary | ICD-10-CM | POA: Diagnosis not present

## 2015-12-30 DIAGNOSIS — G4700 Insomnia, unspecified: Secondary | ICD-10-CM | POA: Diagnosis not present

## 2015-12-30 DIAGNOSIS — Z043 Encounter for examination and observation following other accident: Secondary | ICD-10-CM | POA: Diagnosis not present

## 2015-12-30 DIAGNOSIS — S80211A Abrasion, right knee, initial encounter: Secondary | ICD-10-CM | POA: Diagnosis not present

## 2015-12-30 DIAGNOSIS — R51 Headache: Secondary | ICD-10-CM | POA: Diagnosis not present

## 2015-12-30 DIAGNOSIS — W1809XA Striking against other object with subsequent fall, initial encounter: Secondary | ICD-10-CM | POA: Diagnosis not present

## 2015-12-30 DIAGNOSIS — I10 Essential (primary) hypertension: Secondary | ICD-10-CM | POA: Diagnosis not present

## 2015-12-30 LAB — PROTIME-INR: INR: 2.2 — AB (ref 0.9–1.1)

## 2015-12-31 DIAGNOSIS — Z043 Encounter for examination and observation following other accident: Secondary | ICD-10-CM | POA: Diagnosis not present

## 2015-12-31 DIAGNOSIS — W19XXXA Unspecified fall, initial encounter: Secondary | ICD-10-CM | POA: Diagnosis not present

## 2016-01-04 ENCOUNTER — Encounter: Payer: Self-pay | Admitting: Cardiovascular Disease

## 2016-01-05 DIAGNOSIS — Z23 Encounter for immunization: Secondary | ICD-10-CM | POA: Diagnosis not present

## 2016-01-05 DIAGNOSIS — S0181XA Laceration without foreign body of other part of head, initial encounter: Secondary | ICD-10-CM | POA: Diagnosis not present

## 2016-01-22 DIAGNOSIS — Z954 Presence of other heart-valve replacement: Secondary | ICD-10-CM | POA: Diagnosis not present

## 2016-01-22 DIAGNOSIS — R05 Cough: Secondary | ICD-10-CM | POA: Diagnosis not present

## 2016-01-26 ENCOUNTER — Encounter: Payer: Self-pay | Admitting: Cardiovascular Disease

## 2016-01-28 ENCOUNTER — Other Ambulatory Visit: Payer: Self-pay | Admitting: *Deleted

## 2016-01-28 MED ORDER — AMOXICILLIN 500 MG PO TABS: ORAL_TABLET | ORAL | 6 refills | Status: DC

## 2016-02-12 ENCOUNTER — Ambulatory Visit (INDEPENDENT_AMBULATORY_CARE_PROVIDER_SITE_OTHER): Payer: BLUE CROSS/BLUE SHIELD | Admitting: Pharmacist

## 2016-02-12 DIAGNOSIS — Z952 Presence of prosthetic heart valve: Secondary | ICD-10-CM | POA: Diagnosis not present

## 2016-02-12 DIAGNOSIS — I719 Aortic aneurysm of unspecified site, without rupture: Secondary | ICD-10-CM

## 2016-02-12 DIAGNOSIS — Z5181 Encounter for therapeutic drug level monitoring: Secondary | ICD-10-CM

## 2016-02-12 LAB — POCT INR: INR: 2.1

## 2016-03-25 ENCOUNTER — Ambulatory Visit (INDEPENDENT_AMBULATORY_CARE_PROVIDER_SITE_OTHER): Payer: BLUE CROSS/BLUE SHIELD | Admitting: Pharmacist Clinician (PhC)/ Clinical Pharmacy Specialist

## 2016-03-25 DIAGNOSIS — Z952 Presence of prosthetic heart valve: Secondary | ICD-10-CM

## 2016-03-25 DIAGNOSIS — Z5181 Encounter for therapeutic drug level monitoring: Secondary | ICD-10-CM | POA: Diagnosis not present

## 2016-03-25 DIAGNOSIS — I719 Aortic aneurysm of unspecified site, without rupture: Secondary | ICD-10-CM | POA: Diagnosis not present

## 2016-03-25 LAB — POCT INR: INR: 2.7

## 2016-03-28 ENCOUNTER — Ambulatory Visit: Payer: Managed Care, Other (non HMO) | Admitting: Cardiovascular Disease

## 2016-05-06 ENCOUNTER — Ambulatory Visit (INDEPENDENT_AMBULATORY_CARE_PROVIDER_SITE_OTHER): Payer: BLUE CROSS/BLUE SHIELD | Admitting: *Deleted

## 2016-05-06 DIAGNOSIS — I719 Aortic aneurysm of unspecified site, without rupture: Secondary | ICD-10-CM | POA: Diagnosis not present

## 2016-05-06 DIAGNOSIS — Z5181 Encounter for therapeutic drug level monitoring: Secondary | ICD-10-CM

## 2016-05-06 DIAGNOSIS — Z952 Presence of prosthetic heart valve: Secondary | ICD-10-CM | POA: Diagnosis not present

## 2016-05-06 LAB — POCT INR: INR: 2.8

## 2016-05-11 DIAGNOSIS — R140 Abdominal distension (gaseous): Secondary | ICD-10-CM | POA: Diagnosis not present

## 2016-05-11 DIAGNOSIS — R197 Diarrhea, unspecified: Secondary | ICD-10-CM | POA: Diagnosis not present

## 2016-05-11 DIAGNOSIS — Z952 Presence of prosthetic heart valve: Secondary | ICD-10-CM | POA: Diagnosis not present

## 2016-06-01 ENCOUNTER — Encounter: Payer: Self-pay | Admitting: Cardiovascular Disease

## 2016-06-02 ENCOUNTER — Encounter: Payer: Self-pay | Admitting: Cardiovascular Disease

## 2016-06-09 DIAGNOSIS — G4700 Insomnia, unspecified: Secondary | ICD-10-CM | POA: Diagnosis not present

## 2016-06-09 DIAGNOSIS — J069 Acute upper respiratory infection, unspecified: Secondary | ICD-10-CM | POA: Diagnosis not present

## 2016-06-17 ENCOUNTER — Other Ambulatory Visit: Payer: Self-pay

## 2016-06-17 ENCOUNTER — Ambulatory Visit (INDEPENDENT_AMBULATORY_CARE_PROVIDER_SITE_OTHER): Payer: BLUE CROSS/BLUE SHIELD | Admitting: Pharmacist

## 2016-06-17 ENCOUNTER — Ambulatory Visit (HOSPITAL_COMMUNITY): Payer: BLUE CROSS/BLUE SHIELD | Attending: Internal Medicine

## 2016-06-17 DIAGNOSIS — Z952 Presence of prosthetic heart valve: Secondary | ICD-10-CM | POA: Insufficient documentation

## 2016-06-17 DIAGNOSIS — I428 Other cardiomyopathies: Secondary | ICD-10-CM | POA: Diagnosis not present

## 2016-06-17 DIAGNOSIS — I719 Aortic aneurysm of unspecified site, without rupture: Secondary | ICD-10-CM

## 2016-06-17 DIAGNOSIS — Z5181 Encounter for therapeutic drug level monitoring: Secondary | ICD-10-CM

## 2016-06-17 DIAGNOSIS — I351 Nonrheumatic aortic (valve) insufficiency: Secondary | ICD-10-CM

## 2016-06-17 LAB — POCT INR: INR: 2.6

## 2016-06-20 ENCOUNTER — Encounter: Payer: Self-pay | Admitting: Cardiovascular Disease

## 2016-07-01 ENCOUNTER — Other Ambulatory Visit: Payer: Self-pay | Admitting: *Deleted

## 2016-07-01 MED ORDER — WARFARIN SODIUM 7.5 MG PO TABS: ORAL_TABLET | ORAL | 1 refills | Status: DC

## 2016-07-06 ENCOUNTER — Encounter: Payer: Self-pay | Admitting: Cardiovascular Disease

## 2016-07-20 ENCOUNTER — Encounter: Payer: Self-pay | Admitting: Cardiovascular Disease

## 2016-07-20 ENCOUNTER — Ambulatory Visit (INDEPENDENT_AMBULATORY_CARE_PROVIDER_SITE_OTHER): Payer: BLUE CROSS/BLUE SHIELD | Admitting: Cardiovascular Disease

## 2016-07-20 ENCOUNTER — Ambulatory Visit (INDEPENDENT_AMBULATORY_CARE_PROVIDER_SITE_OTHER): Payer: BLUE CROSS/BLUE SHIELD | Admitting: Pharmacist

## 2016-07-20 VITALS — BP 128/74 | HR 64 | Ht 70.0 in | Wt 226.1 lb

## 2016-07-20 DIAGNOSIS — I719 Aortic aneurysm of unspecified site, without rupture: Secondary | ICD-10-CM

## 2016-07-20 DIAGNOSIS — Z952 Presence of prosthetic heart valve: Secondary | ICD-10-CM

## 2016-07-20 DIAGNOSIS — Z5181 Encounter for therapeutic drug level monitoring: Secondary | ICD-10-CM | POA: Diagnosis not present

## 2016-07-20 DIAGNOSIS — I428 Other cardiomyopathies: Secondary | ICD-10-CM

## 2016-07-20 LAB — POCT INR: INR: 1.9

## 2016-07-20 NOTE — Progress Notes (Signed)
Cardiology Office Note Date:  07/22/2016   ID:  Rhonin Lauriano, DOB 1965/07/09, MRN [id number]  PCP:  Pearla Dubonnet, MD  Cardiologist:  [name], MD    Chief Complaint  Patient presents with  . Follow-up     History of Present Illness: Jeffrey [name] is a 51 y.o. male who presents for follow-up of aortic valve disease. He was initially hospitalized in 2015 with congestive heart failure. The patient was diagnosed with severe aortic valve insufficiency in the setting of an ascending thoracic aortic aneurysm. He underwent Bentall procedure with aortic root replacement using a mechanical valve-conduit and coronary artery reimplantation (St. Jude 27 mm mechanical valve). His ascending aorta was replaced to the proximal arch with a 30 mm Dacron graft.  The patient is here alone today. He is doing very well. He is walking over 10,000 steps every day without symptoms. Today, he denies symptoms of palpitations, chest pain, shortness of breath, orthopnea, PND, lower extremity edema, dizziness, or syncope. Reports compliance with his medications.   Past Medical History:  Diagnosis Date  . Aortic insufficiency    a. 02/2014 - admx with sx severe AI and assoc ascending aortic aneurysm (6.8 cm) >> s/p Bentall with mechancial AVR (Dr. Donata Clay)  . Hx of cardiac catheterization    a. LHC (11/15): EF 55%, marked ascending aortic dilatation with severe AI on aortic root angiography, normal coronary arteries  . Hx of echocardiogram    a. Echo 11/15: EF 55%, severe AI, marked dilation of ascending aorta; b. Echo 12/15: EF 30-35%, large effusion; c. Echo 2/16: EF 40-45%, no RWMA, mechanical AVR ok, trivial effusion post to heart   . NICM (nonischemic cardiomyopathy) (HCC)    a. TEE (11/15): Mild LVH, EF 45-50%, diffuse HK, prominent apical trabeculations, severe AI, severely dilated ascending aorta (6.9 cm), mild MR;  b. EF 30-35% post AVR >> c. EF 40-45% by echo 05/2014  . Pericardial  effusion    large pericardial effusion post Bentall procedure >> s/p pericardial window  . Thoracic ascending aortic aneurysm (HCC)    Echo 11/15: severe AI, severely dilated ascending aorta (6.5 cm);  s/p Bentall 02/2014 with aortic root replacement using a mechanical valve-conduit and reimplantation of coronary arteries (St. Jude 27 mm valve), replacement of ascending aorta to the proximal arch with a 30 mm Dacron graft    Past Surgical History:  Procedure Laterality Date  . BENTALL PROCEDURE N/A 02/24/2014   Procedure: BENTALL PROCEDURE;  Surgeon: Kerin Perna, MD;  Location: [facility] OR;  Service: Open Heart Surgery;  Laterality: N/A;  . INTRAOPERATIVE TRANSESOPHAGEAL ECHOCARDIOGRAM N/A 02/24/2014   Procedure: INTRAOPERATIVE TRANSESOPHAGEAL ECHOCARDIOGRAM;  Surgeon: Kerin Perna, MD;  Location: [facility] OR;  Service: Open Heart Surgery;  Laterality: N/A;  . LEFT AND RIGHT HEART CATHETERIZATION WITH CORONARY ANGIOGRAM N/A 02/20/2014   Procedure: LEFT AND RIGHT HEART CATHETERIZATION WITH CORONARY ANGIOGRAM;  Surgeon: Micheline Chapman, MD;  Location: [facility] CATH LAB;  Service: Cardiovascular;  Laterality: N/A;  . SUBXYPHOID PERICARDIAL WINDOW N/A 04/02/2014   Procedure: SUBXYPHOID PERICARDIAL WINDOW;  Surgeon: Kerin Perna, MD;  Location: [facility] OR;  Service: Thoracic;  Laterality: N/A;  . TEE WITHOUT CARDIOVERSION N/A 02/20/2014   Procedure: TRANSESOPHAGEAL ECHOCARDIOGRAM (TEE);  Surgeon: Lars Masson, MD;  Location: [facility] ENDOSCOPY;  Service: Cardiovascular;  Laterality: N/A;  . TEE WITHOUT CARDIOVERSION N/A 04/02/2014   Procedure: TRANSESOPHAGEAL ECHOCARDIOGRAM (TEE);  Surgeon: Kerin Perna, MD;  Location: [facility] OR;  Service: Thoracic;  Laterality: N/A;  Current Outpatient Prescriptions  Medication Sig Dispense Refill  . acetaminophen (TYLENOL) 500 MG tablet Take 500 mg by mouth every 6 (six) hours as needed for mild pain or fever.     Marland Kitchen amoxicillin (AMOXIL) 500 MG tablet Take all 4 tablets one  hour prior to procedure 4 tablet 6  . aspirin 81 MG EC tablet Take 1 tablet (81 mg total) by mouth daily.    . carvedilol (COREG) 12.5 MG tablet Take 1 tablet (12.5 mg total) by mouth 2 (two) times daily. 180 tablet 3  . lisinopril (PRINIVIL,ZESTRIL) 5 MG tablet Take 1 tablet (5 mg total) by mouth daily. 90 tablet 3  . warfarin (COUMADIN) 7.5 MG tablet Take as directed by [facility] 100 tablet 1  . zolpidem (AMBIEN) 10 MG tablet Take 5 mg by mouth daily.  0   No current facility-administered medications for this visit.     Allergies:   Patient has no known allergies.   Social History:  The patient  reports that he has never smoked. He has never used smokeless tobacco. He reports that he does not drink alcohol or use drugs.   Family History:  The patient's  family history includes Cancer in his maternal grandmother.    ROS:  Please see the history of present illness.   All other systems are reviewed and negative.    PHYSICAL EXAM: VS:  BP 128/74   Pulse 64   Ht 5\' 10"  (1.778 m)   Wt 226 lb 1.9 oz (102.6 kg)   BMI 32.44 kg/m  , BMI Body mass index is 32.44 kg/m. GEN: Well nourished, well developed, in no acute distress  HEENT: normal  Neck: no JVD, no masses. No carotid bruits Cardiac: RRR with soft SEM at the RUSB 1/6 and crisp mechanical A2                Respiratory:  clear to auscultation bilaterally, normal work of breathing GI: soft, nontender, nondistended, + BS MS: no deformity or atrophy  Ext: no pretibial edema, pedal pulses 2+= bilaterally Skin: warm and dry, no rash Neuro:  Strength and sensation are intact Psych: euthymic mood, full affect  EKG:  EKG is ordered today. The ekg ordered today shows NSR 63 bpm, 1st degree AV block, St-T abnormality nonspecific  Recent Labs: No results found for requested labs within last 8760 hours.   Lipid Panel  No results found for: CHOL, TRIG, HDL, CHOLHDL, VLDL, LDLCALC, LDLDIRECT    Wt Readings from Last 3  Encounters:  07/20/16 226 lb 1.9 oz (102.6 kg)  12/24/15 221 lb (100.2 kg)  07/06/15 225 lb 12.8 oz (102.4 kg)     Cardiac Studies Reviewed: 2D Echo 06-17-2016: Study Conclusions  - Left ventricle: The cavity size was normal. Wall thickness was   increased in a pattern of moderate LVH. Systolic function was   normal. The estimated ejection fraction was in the range of 60%   to 65%. Septal hypokinesis. Doppler parameters are consistent   with abnormal left ventricular relaxation (grade 1 diastolic   dysfunction). - Aortic valve: Mechanical AVR. No obstruction. Trivial   regurgitation. Mean gradient (S): 12 mm Hg. Peak gradient (S): 26   mm Hg. - Left atrium: The atrium was normal in size. - Right ventricle: The cavity size was normal. Systolic function is   mildly reduced. - Tricuspid valve: There was trivial regurgitation. - Pulmonary arteries: PA peak pressure: 27 mm Hg (S). - Inferior vena cava: The vessel  was normal in size. The   respirophasic diameter changes were in the normal range (>= 50%),   consistent with normal central venous pressure.  Impressions:  - Compared to a prior study in 2017, there is a slightly higher   mean gradient across the aortic valve, but it does not appear   obstructed. LVEF is unchanged at 60-65%.   ASSESSMENT AND PLAN: 1.  Aortic valve disease s/p mechanical AVR: most recent echo reviewed. Mild increase in gradients from baseline study but still within expected range. Will update echo next year before his return visit. Appears to be asymptomatic. Tolerating warfarin, low dose ASA without bleeding problems. SBE prophylaxis reviewed today.  2. Nonischemic cardiomyopathy: normalization of LV function after treatment of severe AI. Tolerating low dose ACE/beta-blocker. No symptoms of heart failure.   Current medicines are reviewed with the patient today.  The patient does not have concerns regarding medicines.  Labs/ tests ordered today  include:   Orders Placed This Encounter  Procedures  . ECHOCARDIOGRAM COMPLETE    Disposition:   FU one year with an echo prior to the visit.   Enzo Bi, MD  07/22/2016 6:16 AM    [facility] [address] [place], [place], [place]  [address] Phone: [phone]; Fax: [phone]

## 2016-07-20 NOTE — Patient Instructions (Signed)

## 2016-07-29 ENCOUNTER — Other Ambulatory Visit: Payer: Self-pay

## 2016-07-29 DIAGNOSIS — Z952 Presence of prosthetic heart valve: Secondary | ICD-10-CM

## 2016-09-04 ENCOUNTER — Other Ambulatory Visit: Payer: Self-pay | Admitting: Cardiovascular Disease

## 2016-09-04 DIAGNOSIS — I351 Nonrheumatic aortic (valve) insufficiency: Secondary | ICD-10-CM

## 2016-09-04 DIAGNOSIS — Z952 Presence of prosthetic heart valve: Secondary | ICD-10-CM

## 2016-09-09 ENCOUNTER — Ambulatory Visit (INDEPENDENT_AMBULATORY_CARE_PROVIDER_SITE_OTHER): Payer: Managed Care, Other (non HMO)

## 2016-09-09 ENCOUNTER — Telehealth: Payer: Self-pay

## 2016-09-09 DIAGNOSIS — Z7901 Long term (current) use of anticoagulants: Secondary | ICD-10-CM | POA: Diagnosis not present

## 2016-09-09 DIAGNOSIS — Z952 Presence of prosthetic heart valve: Secondary | ICD-10-CM | POA: Diagnosis not present

## 2016-09-09 DIAGNOSIS — Z5181 Encounter for therapeutic drug level monitoring: Secondary | ICD-10-CM | POA: Diagnosis not present

## 2016-09-09 DIAGNOSIS — I719 Aortic aneurysm of unspecified site, without rupture: Secondary | ICD-10-CM

## 2016-09-09 LAB — POCT INR: INR: 2.6

## 2016-09-09 NOTE — Telephone Encounter (Signed)
Called pt's pharmacy [facility] in [place], [place], to inform them that pt has refills at [facility] in [place], [place] and that the pharmacy requested, can call [facility] to transfer the pt's Rx. The pharmacist took [facility] pharmacy information to request the medication transfer. Pharmacist verbalized understanding.

## 2016-09-09 NOTE — Telephone Encounter (Signed)
Called pt and could not leave a message, because mailbox was full.

## 2016-09-09 NOTE — Telephone Encounter (Signed)
Pt seen in [facility] going out of town on business, needs Coreg refill sent to [id number] [address] [place], [place] [address] [phone], leaving town on Saturday.

## 2016-10-20 ENCOUNTER — Other Ambulatory Visit: Payer: Self-pay | Admitting: Cardiovascular Disease

## 2016-10-20 LAB — PROTIME-INR: INR: 1.9 — AB (ref <=1.1)

## 2016-10-21 ENCOUNTER — Ambulatory Visit (INDEPENDENT_AMBULATORY_CARE_PROVIDER_SITE_OTHER): Payer: Managed Care, Other (non HMO) | Admitting: Internal Medicine

## 2016-10-21 DIAGNOSIS — Z7901 Long term (current) use of anticoagulants: Secondary | ICD-10-CM

## 2016-10-21 DIAGNOSIS — Z952 Presence of prosthetic heart valve: Secondary | ICD-10-CM

## 2016-10-21 LAB — PROTIME-INR
INR: 1.9 — AB (ref 0.8–1.2)
Prothrombin Time: 18.5 s — ABNORMAL HIGH (ref 9.1–12.0)

## 2016-10-30 IMAGING — CR DG CHEST 2V
2 series · 2 of 2 positions shown · non-contrast
Comparison: 03/29/2013

CLINICAL DATA: Shortness of breath for 1 day, abdominal pain

EXAM:
CHEST  2 VIEW

[w chest pa]
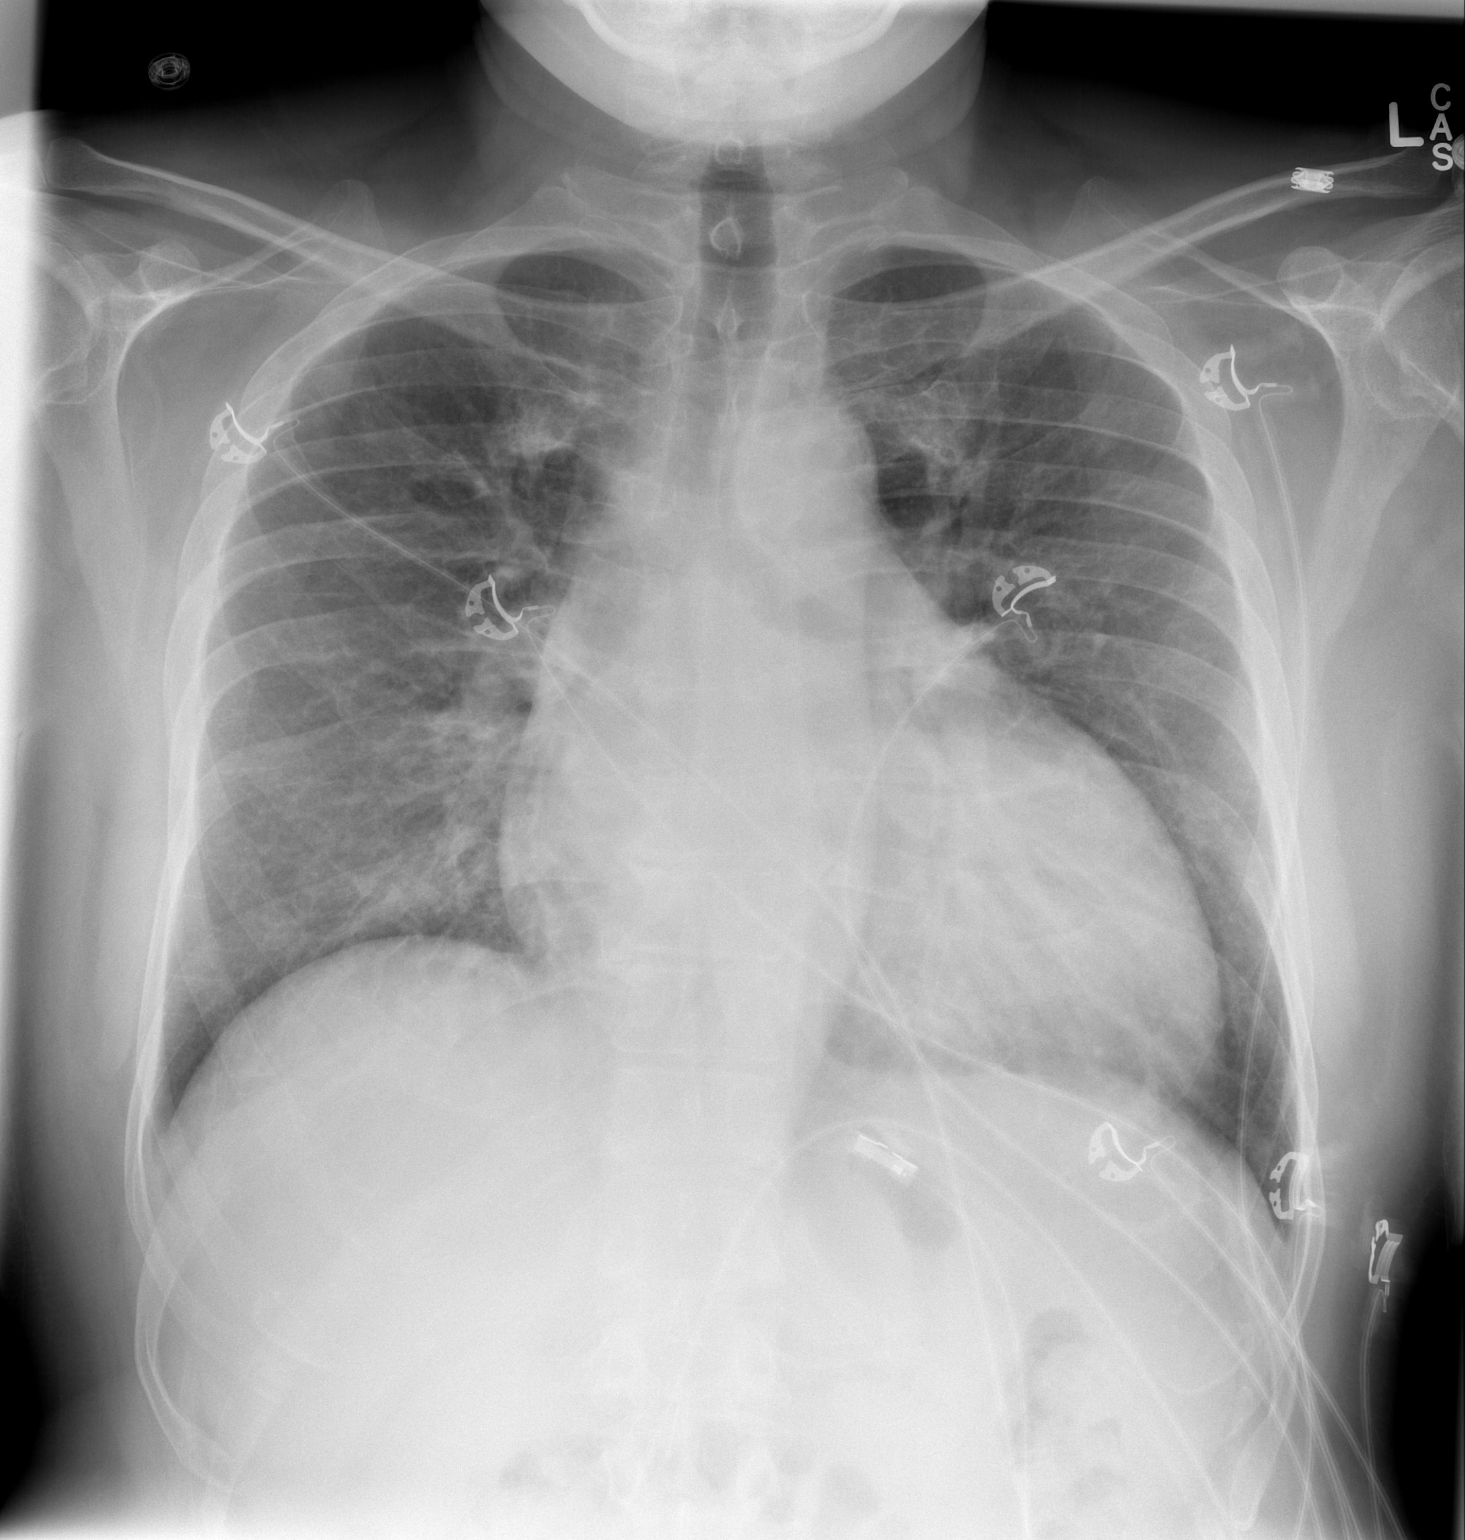

[w chest lat]
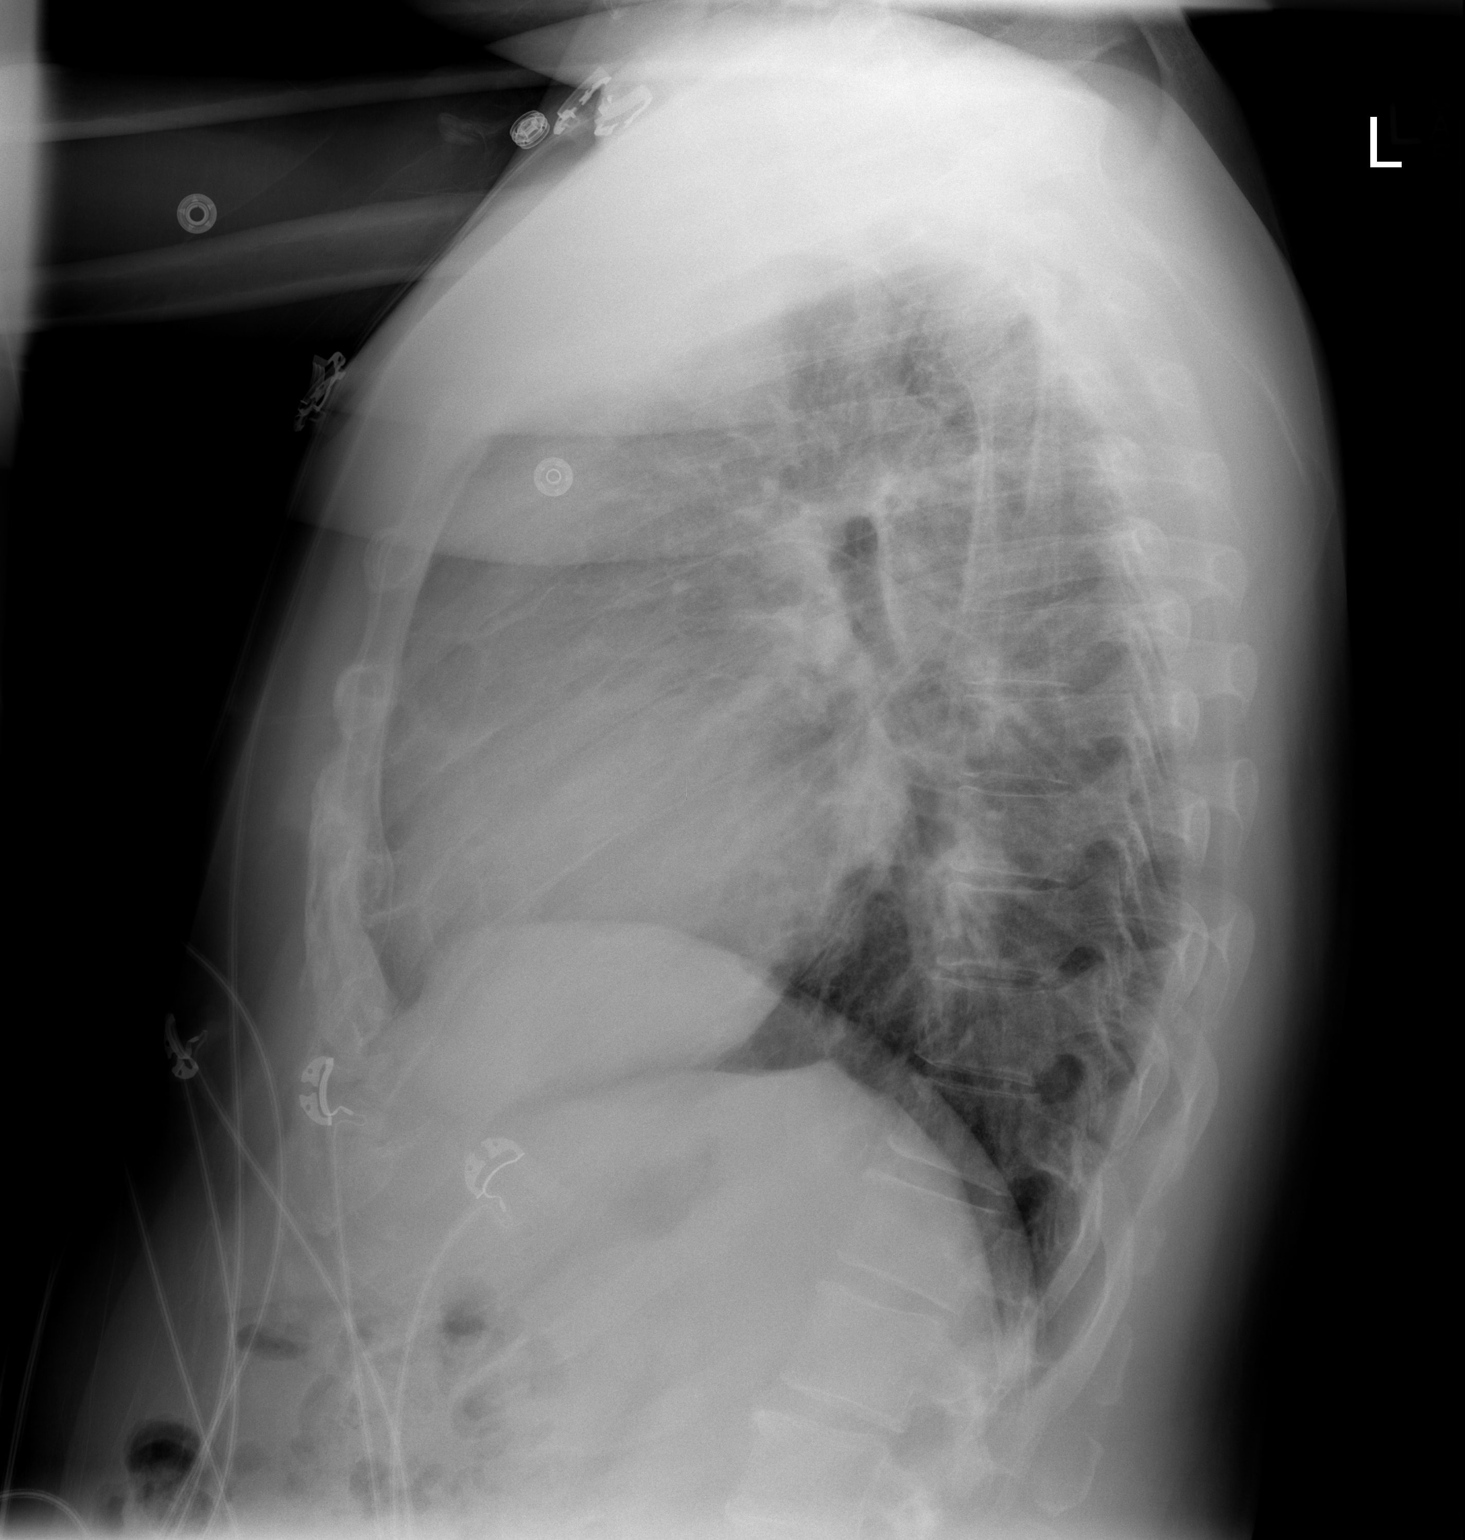

[2 of 2 positions shown; findings below may reference images not displayed]

FINDINGS: Enlargement of cardiac silhouette with pulmonary vascular
congestion.

Mediastinal contours normal.

Question minimal RIGHT perihilar edema with note of a few Kerley
B-lines at the lung bases, likely representing minimal pulmonary
edema.

No segmental consolidation, pleural effusion or pneumothorax.

Bones unremarkable.
IMPRESSION: Enlargement of cardiac silhouette with pulmonary vascular congestion
and suspect minimal pulmonary edema.

## 2016-11-18 ENCOUNTER — Other Ambulatory Visit: Payer: Self-pay | Admitting: Cardiovascular Disease

## 2016-11-18 ENCOUNTER — Ambulatory Visit (INDEPENDENT_AMBULATORY_CARE_PROVIDER_SITE_OTHER): Payer: Managed Care, Other (non HMO) | Admitting: *Deleted

## 2016-11-18 DIAGNOSIS — I351 Nonrheumatic aortic (valve) insufficiency: Secondary | ICD-10-CM

## 2016-11-18 DIAGNOSIS — I719 Aortic aneurysm of unspecified site, without rupture: Secondary | ICD-10-CM | POA: Diagnosis not present

## 2016-11-18 DIAGNOSIS — Z7901 Long term (current) use of anticoagulants: Secondary | ICD-10-CM

## 2016-11-18 DIAGNOSIS — Z952 Presence of prosthetic heart valve: Secondary | ICD-10-CM | POA: Diagnosis not present

## 2016-11-18 DIAGNOSIS — Z5181 Encounter for therapeutic drug level monitoring: Secondary | ICD-10-CM

## 2016-11-18 DIAGNOSIS — I428 Other cardiomyopathies: Secondary | ICD-10-CM

## 2016-11-18 LAB — POCT INR: INR: 2.3

## 2016-11-18 NOTE — Telephone Encounter (Signed)
Medication Detail    Disp Refills Start End   carvedilol (COREG) 12.5 MG tablet 180 tablet 2 09/06/2016    Sig: TAKE ONE TABLET BY MOUTH TWICE A DAY   Sent to pharmacy as: carvedilol (COREG) 12.5 MG tablet   E-Prescribing Status: Receipt confirmed by pharmacy (09/06/2016 11:18 AM EDT)   Associated Diagnoses   Aortic regurgitation     S/P AVR (aortic valve replacement) and aortoplasty     Pharmacy   [phone] - [place], [place] - [address]   Called patient to see how he takes lisinopril. Does he take it once daily or twice daily. No answer, line just kept beeping.

## 2016-12-06 ENCOUNTER — Other Ambulatory Visit: Payer: Self-pay | Admitting: Cardiovascular Disease

## 2016-12-06 DIAGNOSIS — I428 Other cardiomyopathies: Secondary | ICD-10-CM

## 2016-12-06 MED ORDER — LISINOPRIL 5 MG PO TABS: 5.0000 mg | ORAL_TABLET | Freq: Every day | ORAL | 2 refills | Status: DC

## 2016-12-15 IMAGING — CT CT CHEST W/O CM
2 of 3 series · 15 of 36 positions shown, 18 images · non-contrast
Comparison: 02/19/2014

CLINICAL DATA: Left pleural effusion

EXAM:
CT CHEST WITHOUT CONTRAST
TECHNIQUE: Multidetector CT imaging of the chest was performed following the
standard protocol without IV contrast..

[Series 2: thorax 5.0 i31f 1 · axial · 0.74mm/px · z∈[-277,-52]mm · 12 of 53 slices shown, 15 images]
[im 4/53  mediastinal]
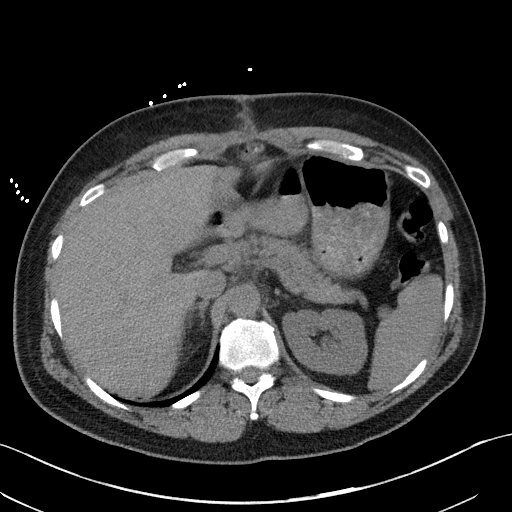
[im 4/53  lung]
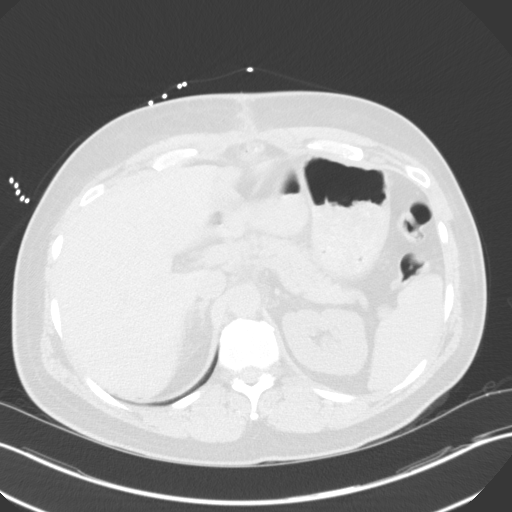
[im 8/53  lung]
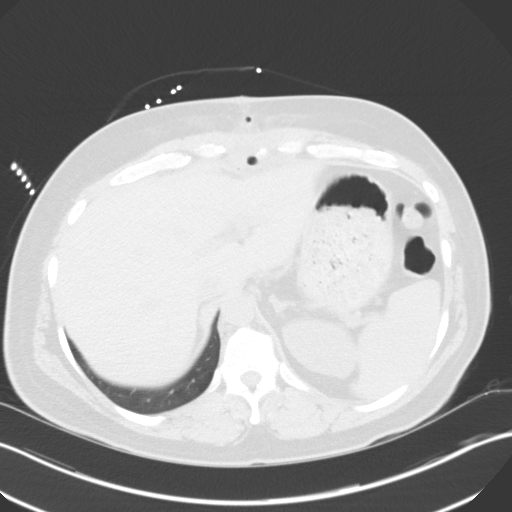
[im 12/53  lung]
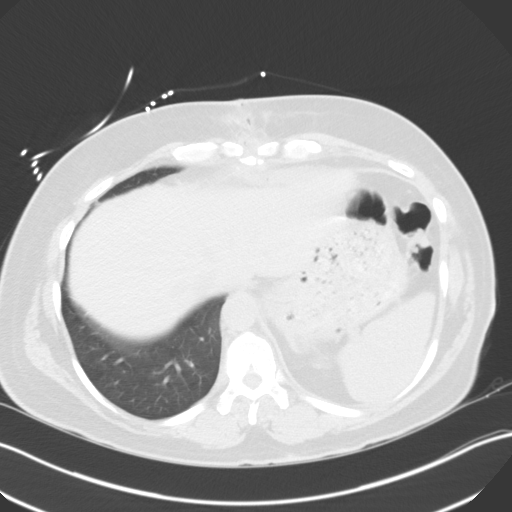
[im 16/53  lung]
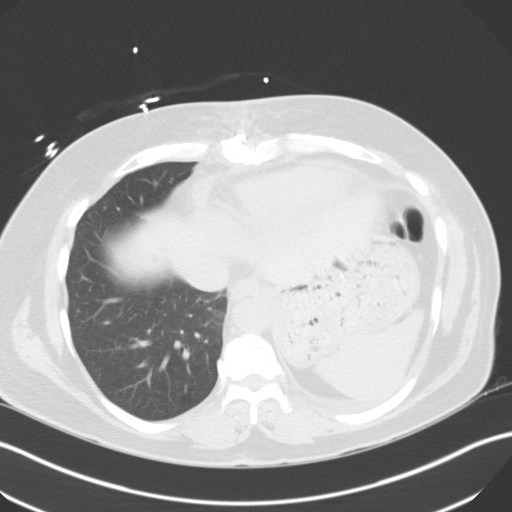
[im 20/53  mediastinal]
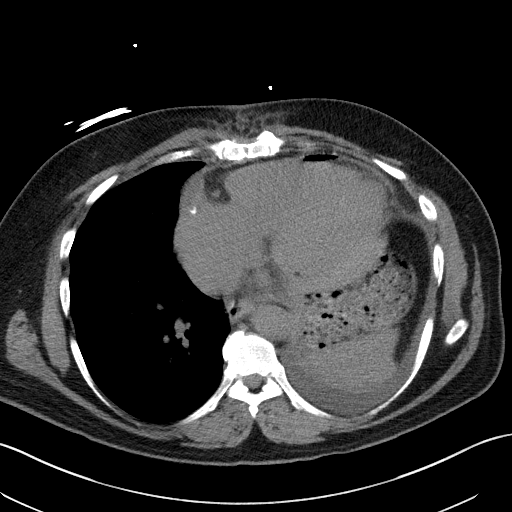
[im 20/53  lung]
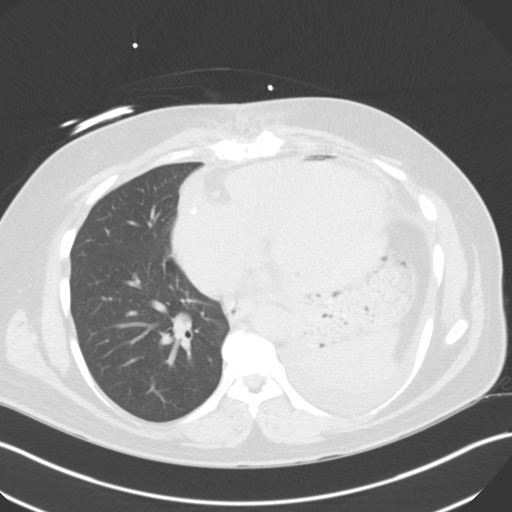
[im 24/53  lung]
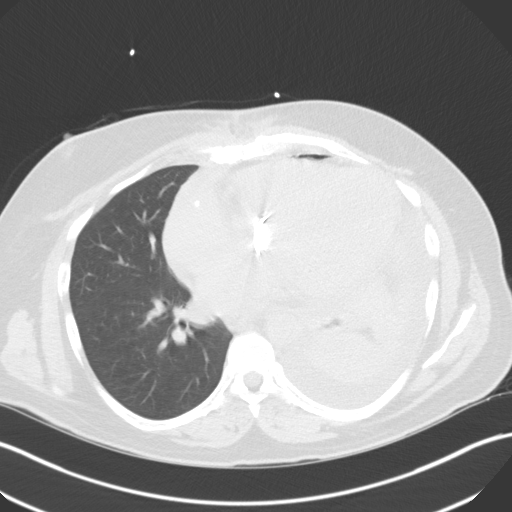
[im 29/53  lung]
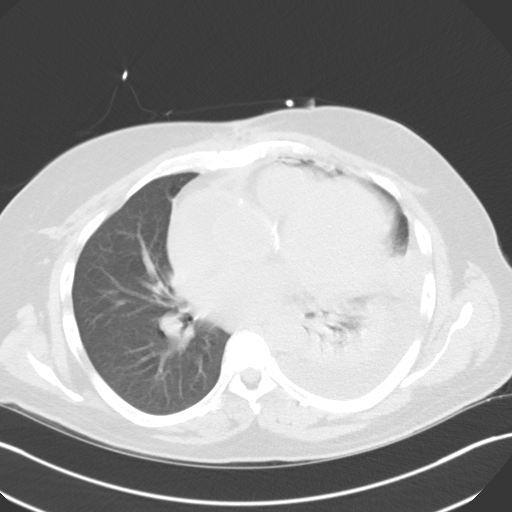
[im 33/53  lung]
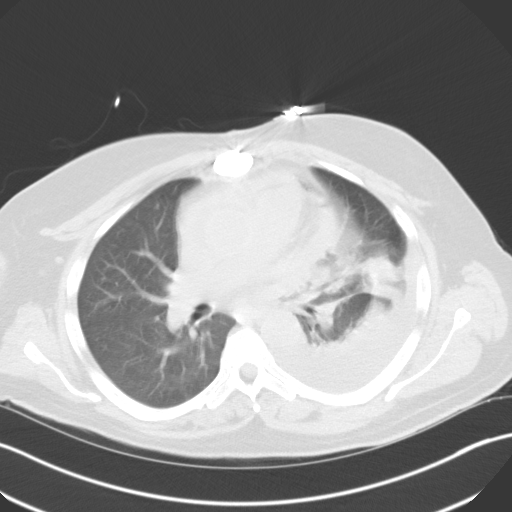
[im 37/53  mediastinal]
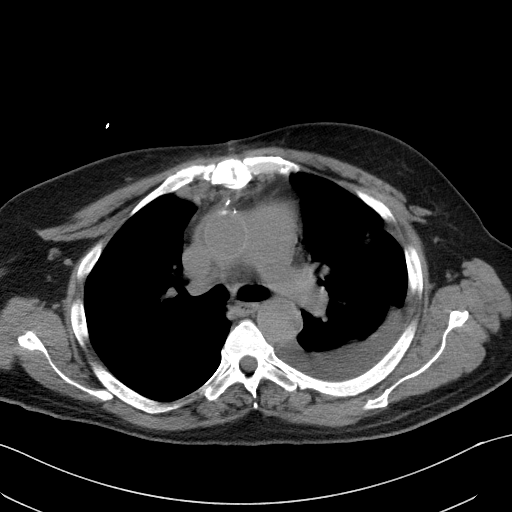
[im 37/53  lung]
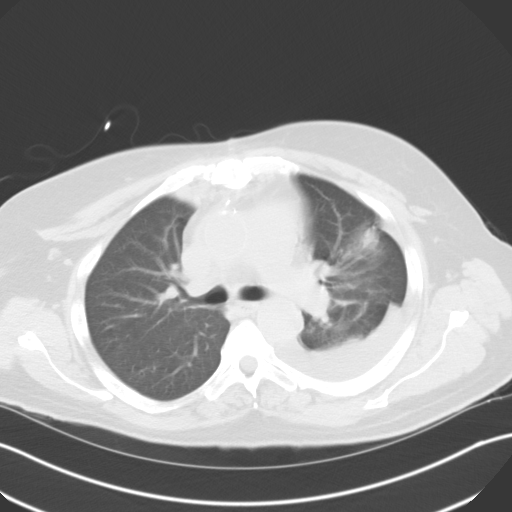
[im 41/53  lung]
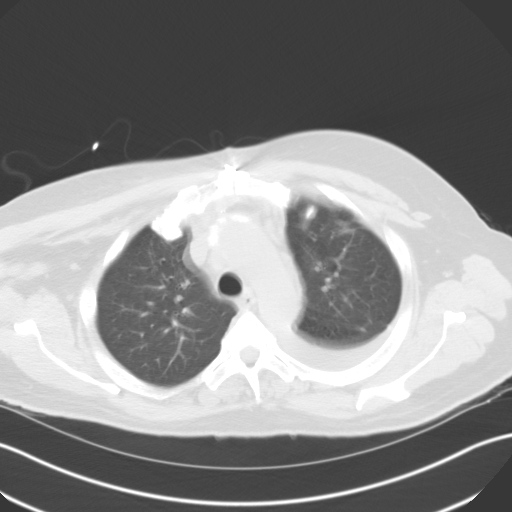
[im 45/53  lung]
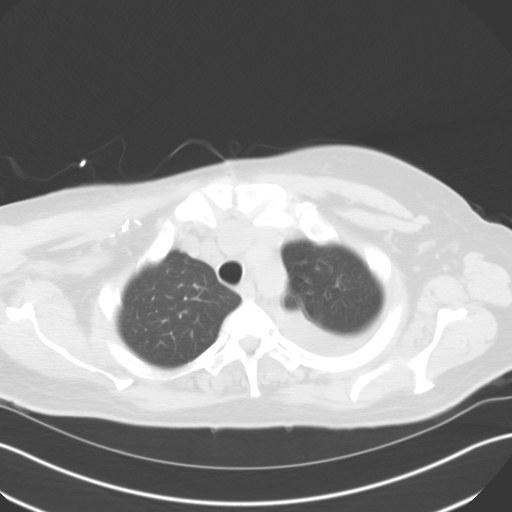
[im 49/53  lung]
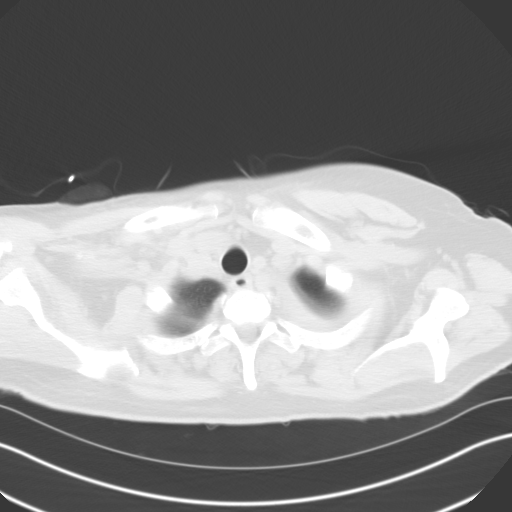

[Series 5: coronal · coronal · 0.59mm/px · 3 of 101 slices shown]
[im 21/101  lung]
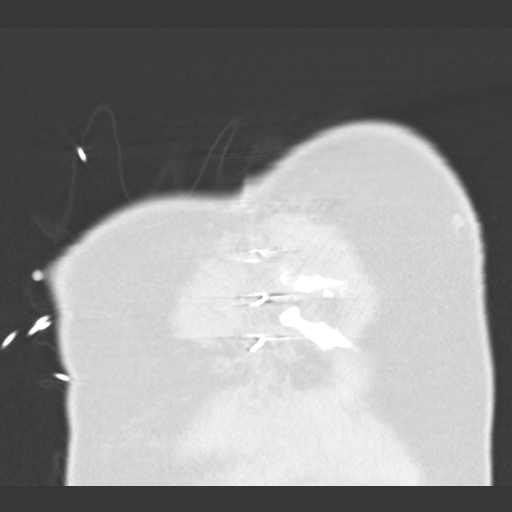
[im 41/101  lung]
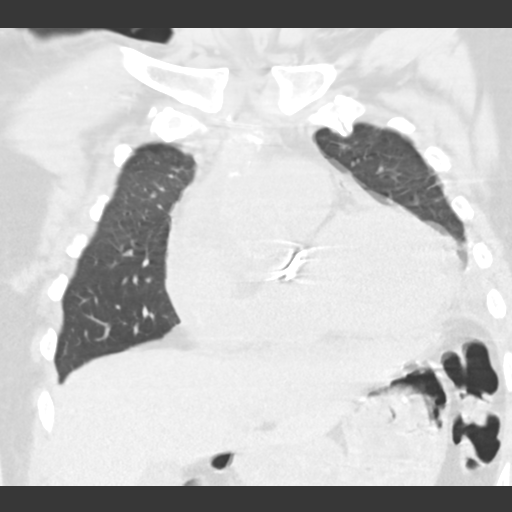
[im 61/101  lung]
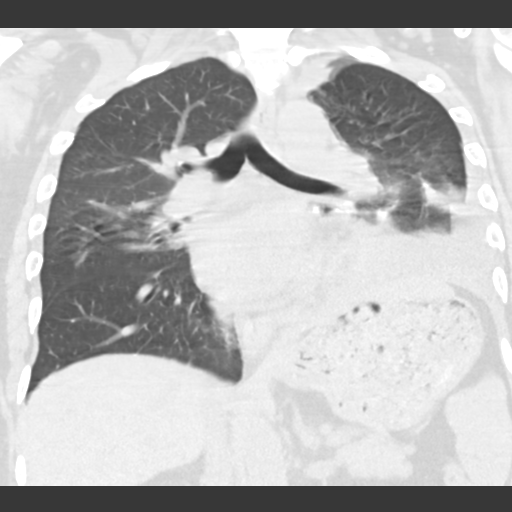

[15 of 36 positions shown; findings below may reference images not displayed]

FINDINGS: Prosthetic aortic valve and aortic root replacement has been
performed. Graft material extends from the valve to the aortic arch.
There is low-density surrounding the graft material likely related
to organizing thrombus in the aneurysm sac which has been excluded.

There is a small amount of pneumomediastinum anterior to the heart
and pulmonary artery. There is also gas in the soft tissues about
the xiphoid process. There is trace free intraperitoneal gas over
the liver dome. This likely represents recent pericardial tube
placement with removal. There is no pneumothorax.

Moderate left pleural effusion. There is a small amount of airspace
disease at the base of the lingula and left lower lobe. Airspace
disease does extend into the anterior and upper left upper lobe.
Right lung is clear.

Small lymph nodes at the thoracic inlet. Sternotomy wires in place.
No vertebral compression deformity.
IMPRESSION: Moderate size left pleural effusion associated with a small amount
of airspace disease at the base of the left upper and lower lobes as
described.

There is pneumomediastinum and other areas of gas likely related to
recent pericardial chest tube placement and removal. Correlate with
history.

## 2016-12-29 DIAGNOSIS — M109 Gout, unspecified: Secondary | ICD-10-CM | POA: Diagnosis not present

## 2016-12-29 DIAGNOSIS — Z0001 Encounter for general adult medical examination with abnormal findings: Secondary | ICD-10-CM | POA: Diagnosis not present

## 2016-12-29 DIAGNOSIS — R7303 Prediabetes: Secondary | ICD-10-CM | POA: Diagnosis not present

## 2016-12-29 DIAGNOSIS — Z952 Presence of prosthetic heart valve: Secondary | ICD-10-CM | POA: Diagnosis not present

## 2016-12-29 DIAGNOSIS — E781 Pure hyperglyceridemia: Secondary | ICD-10-CM | POA: Diagnosis not present

## 2016-12-29 DIAGNOSIS — Z125 Encounter for screening for malignant neoplasm of prostate: Secondary | ICD-10-CM | POA: Diagnosis not present

## 2016-12-29 DIAGNOSIS — Z954 Presence of other heart-valve replacement: Secondary | ICD-10-CM | POA: Diagnosis not present

## 2016-12-29 DIAGNOSIS — Z23 Encounter for immunization: Secondary | ICD-10-CM | POA: Diagnosis not present

## 2016-12-30 ENCOUNTER — Telehealth: Payer: Self-pay | Admitting: Cardiovascular Disease

## 2016-12-30 ENCOUNTER — Ambulatory Visit (INDEPENDENT_AMBULATORY_CARE_PROVIDER_SITE_OTHER): Payer: BLUE CROSS/BLUE SHIELD | Admitting: Pharmacist

## 2016-12-30 DIAGNOSIS — I719 Aortic aneurysm of unspecified site, without rupture: Secondary | ICD-10-CM

## 2016-12-30 DIAGNOSIS — Z7901 Long term (current) use of anticoagulants: Secondary | ICD-10-CM

## 2016-12-30 DIAGNOSIS — Z952 Presence of prosthetic heart valve: Secondary | ICD-10-CM

## 2016-12-30 DIAGNOSIS — Z5181 Encounter for therapeutic drug level monitoring: Secondary | ICD-10-CM

## 2016-12-30 LAB — POCT INR: INR: 1.8

## 2016-12-30 MED ORDER — WARFARIN SODIUM 7.5 MG PO TABS: ORAL_TABLET | ORAL | 1 refills | Status: DC

## 2016-12-30 NOTE — Telephone Encounter (Signed)
New message   Pt wants to pick up lab order so that he can get his labs done anywhere. He wants to know when the order will be ready

## 2016-12-30 NOTE — Telephone Encounter (Signed)
Disregard - pt called back to Coumadin clinic and appt rescheduled.

## 2017-01-31 ENCOUNTER — Ambulatory Visit (INDEPENDENT_AMBULATORY_CARE_PROVIDER_SITE_OTHER): Payer: BLUE CROSS/BLUE SHIELD | Admitting: *Deleted

## 2017-01-31 DIAGNOSIS — I719 Aortic aneurysm of unspecified site, without rupture: Secondary | ICD-10-CM | POA: Diagnosis not present

## 2017-01-31 DIAGNOSIS — Z952 Presence of prosthetic heart valve: Secondary | ICD-10-CM

## 2017-01-31 DIAGNOSIS — Z5181 Encounter for therapeutic drug level monitoring: Secondary | ICD-10-CM

## 2017-01-31 DIAGNOSIS — Z7901 Long term (current) use of anticoagulants: Secondary | ICD-10-CM

## 2017-01-31 LAB — POCT INR: INR: 2.4

## 2017-02-22 DIAGNOSIS — M109 Gout, unspecified: Secondary | ICD-10-CM | POA: Diagnosis not present

## 2017-03-01 ENCOUNTER — Ambulatory Visit (INDEPENDENT_AMBULATORY_CARE_PROVIDER_SITE_OTHER): Payer: BLUE CROSS/BLUE SHIELD | Admitting: *Deleted

## 2017-03-01 DIAGNOSIS — Z7901 Long term (current) use of anticoagulants: Secondary | ICD-10-CM

## 2017-03-01 DIAGNOSIS — Z5181 Encounter for therapeutic drug level monitoring: Secondary | ICD-10-CM

## 2017-03-01 DIAGNOSIS — I719 Aortic aneurysm of unspecified site, without rupture: Secondary | ICD-10-CM

## 2017-03-01 DIAGNOSIS — Z952 Presence of prosthetic heart valve: Secondary | ICD-10-CM | POA: Diagnosis not present

## 2017-03-01 LAB — POCT INR: INR: 2.2

## 2017-03-01 NOTE — Patient Instructions (Signed)
Continue same dosage 1 tablet every day except 1.5 tablets on Mondays.  Recheck in 6 weeks. Call with any changes [phone].

## 2017-03-06 DIAGNOSIS — J069 Acute upper respiratory infection, unspecified: Secondary | ICD-10-CM | POA: Diagnosis not present

## 2017-03-06 DIAGNOSIS — J029 Acute pharyngitis, unspecified: Secondary | ICD-10-CM | POA: Diagnosis not present

## 2017-03-07 ENCOUNTER — Encounter: Payer: Self-pay | Admitting: Cardiovascular Disease

## 2017-03-08 DIAGNOSIS — J069 Acute upper respiratory infection, unspecified: Secondary | ICD-10-CM | POA: Diagnosis not present

## 2017-03-09 ENCOUNTER — Encounter: Payer: Self-pay | Admitting: Cardiovascular Disease

## 2017-03-13 ENCOUNTER — Ambulatory Visit (INDEPENDENT_AMBULATORY_CARE_PROVIDER_SITE_OTHER): Payer: BLUE CROSS/BLUE SHIELD | Admitting: *Deleted

## 2017-03-13 DIAGNOSIS — I719 Aortic aneurysm of unspecified site, without rupture: Secondary | ICD-10-CM

## 2017-03-13 DIAGNOSIS — Z5181 Encounter for therapeutic drug level monitoring: Secondary | ICD-10-CM

## 2017-03-13 DIAGNOSIS — Z952 Presence of prosthetic heart valve: Secondary | ICD-10-CM

## 2017-03-13 DIAGNOSIS — Z7901 Long term (current) use of anticoagulants: Secondary | ICD-10-CM

## 2017-03-13 DIAGNOSIS — M109 Gout, unspecified: Secondary | ICD-10-CM | POA: Diagnosis not present

## 2017-03-13 LAB — POCT INR: INR: 2.4

## 2017-03-13 NOTE — Patient Instructions (Signed)
Continue same dosage 1 tablet every day except 1.5 tablets on Mondays.  Recheck in 4 weeks. Call with any changes [phone].

## 2017-03-14 ENCOUNTER — Encounter: Payer: Self-pay | Admitting: Cardiovascular Disease

## 2017-03-16 ENCOUNTER — Encounter: Payer: Self-pay | Admitting: Cardiovascular Disease

## 2017-03-17 ENCOUNTER — Ambulatory Visit (INDEPENDENT_AMBULATORY_CARE_PROVIDER_SITE_OTHER): Payer: BLUE CROSS/BLUE SHIELD | Admitting: Pharmacist

## 2017-03-17 DIAGNOSIS — Z5181 Encounter for therapeutic drug level monitoring: Secondary | ICD-10-CM

## 2017-03-17 DIAGNOSIS — Z952 Presence of prosthetic heart valve: Secondary | ICD-10-CM | POA: Diagnosis not present

## 2017-03-17 DIAGNOSIS — Z7901 Long term (current) use of anticoagulants: Secondary | ICD-10-CM | POA: Diagnosis not present

## 2017-03-17 DIAGNOSIS — I719 Aortic aneurysm of unspecified site, without rupture: Secondary | ICD-10-CM

## 2017-03-17 LAB — POCT INR: INR: 2.9

## 2017-03-17 NOTE — Patient Instructions (Signed)
Take 1/2 tablet today, then take 1 tablet daily while on prednisone. Rcheck INR in 1 week. Call with any changes [phone].

## 2017-03-24 ENCOUNTER — Ambulatory Visit (INDEPENDENT_AMBULATORY_CARE_PROVIDER_SITE_OTHER): Payer: BLUE CROSS/BLUE SHIELD | Admitting: *Deleted

## 2017-03-24 DIAGNOSIS — Z952 Presence of prosthetic heart valve: Secondary | ICD-10-CM | POA: Diagnosis not present

## 2017-03-24 DIAGNOSIS — Z7901 Long term (current) use of anticoagulants: Secondary | ICD-10-CM | POA: Diagnosis not present

## 2017-03-24 DIAGNOSIS — Z5181 Encounter for therapeutic drug level monitoring: Secondary | ICD-10-CM | POA: Diagnosis not present

## 2017-03-24 DIAGNOSIS — I719 Aortic aneurysm of unspecified site, without rupture: Secondary | ICD-10-CM

## 2017-03-24 LAB — POCT INR: INR: 1.9

## 2017-03-24 NOTE — Patient Instructions (Signed)
Description   Today take an extra 1/2 tablet then continue taking 1 tablet daily except 1.5 tablets on Mondays.  Recheck INR in 2 weeks.  Call with any changes [phone].

## 2017-03-30 ENCOUNTER — Encounter: Payer: Self-pay | Admitting: Cardiovascular Disease

## 2017-03-31 DIAGNOSIS — M79672 Pain in left foot: Secondary | ICD-10-CM | POA: Diagnosis not present

## 2017-03-31 DIAGNOSIS — M109 Gout, unspecified: Secondary | ICD-10-CM | POA: Diagnosis not present

## 2017-04-05 ENCOUNTER — Telehealth: Payer: Self-pay | Admitting: Cardiovascular Disease

## 2017-04-05 ENCOUNTER — Encounter: Payer: Self-pay | Admitting: Cardiovascular Disease

## 2017-04-05 NOTE — Telephone Encounter (Signed)
Called spoke with pt, made new appt for 04/10/17 at 10am.

## 2017-04-05 NOTE — Telephone Encounter (Signed)
New Message     If Home Health RN is calling please get Coumadin Nurse on the phone STAT  1.  Are you calling in regards to an appointment? No  2.  Are you calling for a refill ? yes  3.  Are you having bleeding issues? no  4.  Do you need clearance to hold Coumadin? No  Patient cancelled his 12/28 appt because he is going out of town. He wants to pick up today an order to have his coumadin checked at [facility] and a prescription for warfarin today. Please call asap to let him know if he can do this.   Please route to the Coumadin Clinic Pool

## 2017-04-10 ENCOUNTER — Ambulatory Visit (INDEPENDENT_AMBULATORY_CARE_PROVIDER_SITE_OTHER): Payer: BLUE CROSS/BLUE SHIELD | Admitting: *Deleted

## 2017-04-10 DIAGNOSIS — Z7901 Long term (current) use of anticoagulants: Secondary | ICD-10-CM

## 2017-04-10 DIAGNOSIS — Z952 Presence of prosthetic heart valve: Secondary | ICD-10-CM

## 2017-04-10 DIAGNOSIS — Z5181 Encounter for therapeutic drug level monitoring: Secondary | ICD-10-CM | POA: Diagnosis not present

## 2017-04-10 DIAGNOSIS — I719 Aortic aneurysm of unspecified site, without rupture: Secondary | ICD-10-CM | POA: Diagnosis not present

## 2017-04-10 LAB — POCT INR: INR: 2

## 2017-04-10 NOTE — Patient Instructions (Signed)
Description   Today take 2 tablets then continue taking 1 tablet daily except 1.5 tablets on Mondays.  Recheck INR in 3 weeks at [facility] in [place]. Call with any changes [phone].

## 2017-04-12 DIAGNOSIS — M109 Gout, unspecified: Secondary | ICD-10-CM | POA: Diagnosis not present

## 2017-04-12 DIAGNOSIS — R7303 Prediabetes: Secondary | ICD-10-CM | POA: Diagnosis not present

## 2017-04-13 ENCOUNTER — Ambulatory Visit: Payer: BLUE CROSS/BLUE SHIELD | Admitting: Podiatry

## 2017-04-13 ENCOUNTER — Ambulatory Visit (INDEPENDENT_AMBULATORY_CARE_PROVIDER_SITE_OTHER): Payer: BLUE CROSS/BLUE SHIELD

## 2017-04-13 ENCOUNTER — Encounter: Payer: Self-pay | Admitting: Podiatry

## 2017-04-13 ENCOUNTER — Ambulatory Visit: Payer: Self-pay

## 2017-04-13 DIAGNOSIS — M109 Gout, unspecified: Secondary | ICD-10-CM

## 2017-04-17 ENCOUNTER — Encounter: Payer: Self-pay | Admitting: Cardiovascular Disease

## 2017-04-18 ENCOUNTER — Telehealth: Payer: Self-pay | Admitting: *Deleted

## 2017-04-18 NOTE — Telephone Encounter (Signed)
Pt called and states that he continues to have gout and will finish Prednisone 5mg  daily this week and will continue to take Allopurinol as he has been taking for the past months. Pt instructed that unless he has an increase in dose of Prednisone or on some new medications that it will be ok to get INR checked as scheduled in [place] on 05/03/2017 . Pt states understanding Also transferred call to scheduling so he can get the appointments scheduled as needed

## 2017-05-04 ENCOUNTER — Other Ambulatory Visit: Payer: Self-pay | Admitting: Cardiovascular Disease

## 2017-05-04 LAB — PROTIME-INR: INR: 2.6 — AB (ref <=1.1)

## 2017-05-05 LAB — PROTIME-INR
INR: 2.6 — AB (ref 0.8–1.2)
PROTHROMBIN TIME: 26.5 s — AB (ref 9.1–12.0)

## 2017-05-08 ENCOUNTER — Ambulatory Visit (INDEPENDENT_AMBULATORY_CARE_PROVIDER_SITE_OTHER): Payer: BLUE CROSS/BLUE SHIELD | Admitting: Cardiology

## 2017-05-08 DIAGNOSIS — Z7901 Long term (current) use of anticoagulants: Secondary | ICD-10-CM | POA: Diagnosis not present

## 2017-05-08 DIAGNOSIS — Z952 Presence of prosthetic heart valve: Secondary | ICD-10-CM

## 2017-06-14 ENCOUNTER — Other Ambulatory Visit: Payer: Self-pay

## 2017-06-14 ENCOUNTER — Ambulatory Visit (HOSPITAL_COMMUNITY): Payer: BLUE CROSS/BLUE SHIELD | Attending: Cardiology

## 2017-06-14 ENCOUNTER — Ambulatory Visit (INDEPENDENT_AMBULATORY_CARE_PROVIDER_SITE_OTHER): Payer: BLUE CROSS/BLUE SHIELD | Admitting: *Deleted

## 2017-06-14 DIAGNOSIS — Z952 Presence of prosthetic heart valve: Secondary | ICD-10-CM

## 2017-06-14 DIAGNOSIS — I719 Aortic aneurysm of unspecified site, without rupture: Secondary | ICD-10-CM | POA: Diagnosis not present

## 2017-06-14 DIAGNOSIS — Z7901 Long term (current) use of anticoagulants: Secondary | ICD-10-CM

## 2017-06-14 DIAGNOSIS — I428 Other cardiomyopathies: Secondary | ICD-10-CM

## 2017-06-14 DIAGNOSIS — I5030 Unspecified diastolic (congestive) heart failure: Secondary | ICD-10-CM | POA: Diagnosis not present

## 2017-06-14 DIAGNOSIS — Z5181 Encounter for therapeutic drug level monitoring: Secondary | ICD-10-CM | POA: Diagnosis not present

## 2017-06-14 DIAGNOSIS — I420 Dilated cardiomyopathy: Secondary | ICD-10-CM | POA: Diagnosis not present

## 2017-06-14 LAB — POCT INR: INR: 2.6

## 2017-06-14 MED ORDER — PERFLUTREN LIPID MICROSPHERE
1.0000 mL | INTRAVENOUS | Status: AC | PRN
  Administered 2017-06-14: 1 mL via INTRAVENOUS

## 2017-06-14 NOTE — Patient Instructions (Signed)
Description   Spoke with patient and advised to Continue taking 1 tablet daily except 1.5 tablets on Mondays.  Recheck INR in 4 weeks at [place] in [place]. Call with any changes [phone].

## 2017-06-19 ENCOUNTER — Encounter: Payer: Self-pay | Admitting: Cardiovascular Disease

## 2017-06-19 ENCOUNTER — Ambulatory Visit (INDEPENDENT_AMBULATORY_CARE_PROVIDER_SITE_OTHER): Payer: BLUE CROSS/BLUE SHIELD | Admitting: Cardiovascular Disease

## 2017-06-19 VITALS — BP 130/74 | HR 66 | Ht 70.0 in | Wt 223.1 lb

## 2017-06-19 DIAGNOSIS — I428 Other cardiomyopathies: Secondary | ICD-10-CM | POA: Diagnosis not present

## 2017-06-19 DIAGNOSIS — I351 Nonrheumatic aortic (valve) insufficiency: Secondary | ICD-10-CM | POA: Diagnosis not present

## 2017-06-19 DIAGNOSIS — Z952 Presence of prosthetic heart valve: Secondary | ICD-10-CM | POA: Diagnosis not present

## 2017-06-19 MED ORDER — LISINOPRIL 5 MG PO TABS: 5.0000 mg | ORAL_TABLET | Freq: Every day | ORAL | 3 refills | Status: DC

## 2017-06-19 MED ORDER — CARVEDILOL 12.5 MG PO TABS: 12.5000 mg | ORAL_TABLET | Freq: Two times a day (BID) | ORAL | 3 refills | Status: DC

## 2017-06-19 NOTE — Progress Notes (Signed)
Cardiology Office Note Date:  06/19/2017   ID:  Jeffrey [name], DOB 02/04/1966, MRN [id number]  PCP:  Jeffrey Noble, MD  Cardiologist:  Jeffrey Bollman, MD    Chief Complaint  Patient presents with  . Follow-up    aortic valve disease     History of Present Illness: Jeffrey [name] is a 51 y.o. male who presents for follow-up of aortic valve disease.  The patient was diagnosed with severe aortic valve insufficiency in 2015 in the setting of an ascending thoracic aortic aneurysm.  He was treated with Bentall surgery with aortic root replacement using a mechanical valve conduit and coronary artery reimplantation.  A Saint Jude 27 mm mechanical valve was used and is a sending aorta was replaced with a 30 mm Dacron graft.  He was seen last 1 year ago at which time he was doing very well.  The patient is here with his wife today.  He continues to do well from a cardiac perspective.  Since his appointment last year he has had some problems with gout in his left foot.  This is been managed by his primary physician.  He has had no chest pain, chest pressure, or shortness of breath.  He continues to travel to [place] regularly with his work.  He reports no bleeding problems on chronic anticoagulation.  Past Medical History:  Diagnosis Date  . Aortic insufficiency    a. 02/2014 - admx with sx severe AI and assoc ascending aortic aneurysm (6.8 cm) >> s/p Bentall with mechancial AVR (Dr. Donata [name])  . Hx of cardiac catheterization    a. LHC (11/15): EF 55%, marked ascending aortic dilatation with severe AI on aortic root angiography, normal coronary arteries  . Hx of echocardiogram    a. Echo 11/15: EF 55%, severe AI, marked dilation of ascending aorta; b. Echo 12/15: EF 30-35%, large effusion; c. Echo 2/16: EF 40-45%, no RWMA, mechanical AVR ok, trivial effusion post to heart   . NICM (nonischemic cardiomyopathy) (HCC)    a. TEE (11/15): Mild LVH, EF 45-50%, diffuse HK, prominent apical  trabeculations, severe AI, severely dilated ascending aorta (6.9 cm), mild MR;  b. EF 30-35% post AVR >> c. EF 40-45% by echo 05/2014  . Pericardial effusion    large pericardial effusion post Bentall procedure >> s/p pericardial window  . Thoracic ascending aortic aneurysm (HCC)    Echo 11/15: severe AI, severely dilated ascending aorta (6.5 cm);  s/p Bentall 02/2014 with aortic root replacement using a mechanical valve-conduit and reimplantation of coronary arteries (St. Jude 27 mm valve), replacement of ascending aorta to the proximal arch with a 30 mm Dacron graft    Past Surgical History:  Procedure Laterality Date  . BENTALL PROCEDURE N/A 02/24/2014   Procedure: BENTALL PROCEDURE;  Surgeon: Jeffrey Perna, MD;  Location: [facility] OR;  Service: Open Heart Surgery;  Laterality: N/A;  . INTRAOPERATIVE TRANSESOPHAGEAL ECHOCARDIOGRAM N/A 02/24/2014   Procedure: INTRAOPERATIVE TRANSESOPHAGEAL ECHOCARDIOGRAM;  Surgeon: Jeffrey Perna, MD;  Location: [facility] OR;  Service: Open Heart Surgery;  Laterality: N/A;  . LEFT AND RIGHT HEART CATHETERIZATION WITH CORONARY ANGIOGRAM N/A 02/20/2014   Procedure: LEFT AND RIGHT HEART CATHETERIZATION WITH CORONARY ANGIOGRAM;  Surgeon: Jeffrey Chapman, MD;  Location: [facility] CATH LAB;  Service: Cardiovascular;  Laterality: N/A;  . SUBXYPHOID PERICARDIAL WINDOW N/A 04/02/2014   Procedure: SUBXYPHOID PERICARDIAL WINDOW;  Surgeon: Jeffrey Perna, MD;  Location[facility] OR;  Service: Thoracic;  Laterality: N/A;  . TEE WITHOUT CARDIOVERSION N/A  02/20/2014   Procedure: TRANSESOPHAGEAL ECHOCARDIOGRAM (TEE);  Surgeon: Jeffrey Masson, MD;  Location: [facility] ENDOSCOPY;  Service: Cardiovascular;  Laterality: N/A;  . TEE WITHOUT CARDIOVERSION N/A 04/02/2014   Procedure: TRANSESOPHAGEAL ECHOCARDIOGRAM (TEE);  Surgeon: Jeffrey Perna, MD;  Location: [facility] OR;  Service: Thoracic;  Laterality: N/A;    Current Outpatient Medications  Medication Sig Dispense Refill  . acetaminophen (TYLENOL) 500 MG  tablet Take 500 mg by mouth every 6 (six) hours as needed for mild pain or fever.     Marland Kitchen allopurinol (ZYLOPRIM) 100 MG tablet Take 300 mg by mouth daily    . amoxicillin (AMOXIL) 500 MG tablet Take all 4 tablets one hour prior to procedure 4 tablet 6  . aspirin 81 MG EC tablet Take 1 tablet (81 mg total) by mouth daily.    . carvedilol (COREG) 12.5 MG tablet Take 1 tablet (12.5 mg total) by mouth 2 (two) times daily. 180 tablet 3  . lisinopril (PRINIVIL,ZESTRIL) 5 MG tablet Take 1 tablet (5 mg total) by mouth daily. 90 tablet 3  . warfarin (COUMADIN) 7.5 MG tablet Take as directed by [facility] 100 tablet 1  . zolpidem (AMBIEN) 10 MG tablet Take 5 mg by mouth daily.  0   No current facility-administered medications for this visit.     Allergies:   Patient has no known allergies.   Social History:  The patient  reports that  has never smoked. he has never used smokeless tobacco. He reports that he does not drink alcohol or use drugs.   Family History:  The patient's family history includes Cancer in his maternal grandmother.   ROS:  Please see the history of present illness.  Otherwise, review of systems is positive for left foot pain (gout), rash.  All other systems are reviewed and negative.   PHYSICAL EXAM: VS:  BP 130/74   Pulse 66   Ht 5\' 10"  (1.778 m)   Wt 223 lb 1.9 oz (101.2 kg)   BMI 32.01 kg/m  , BMI Body mass index is 32.01 kg/m. GEN: Well nourished, well developed, in no acute distress  HEENT: normal  Neck: no JVD, no masses. No carotid bruits Cardiac: RRR with crisp mechanical A2 Respiratory:  clear to auscultation bilaterally, normal work of breathing GI: soft, nontender, nondistended, + BS MS: no deformity or atrophy  Ext: no pretibial edema, pedal pulses 2+= bilaterally Skin: warm and dry, no rash Neuro:  Strength and sensation are intact Psych: euthymic mood, full affect  EKG:  EKG is ordered today. The ekg ordered today shows NSR 66 bpm, ST-T wave changes  consider lateral ischemia.  No change from last year's tracing.  Recent Labs: No results found for requested labs within last 8760 hours.   Lipid Panel  No results found for: CHOL, TRIG, HDL, CHOLHDL, VLDL, LDLCALC, LDLDIRECT    Wt Readings from Last 3 Encounters:  06/19/17 223 lb 1.9 oz (101.2 kg)  07/20/16 226 lb 1.9 oz (102.6 kg)  12/24/15 221 lb (100.2 kg)     Cardiac Studies Reviewed: 2D Echo 06-14-2017: Left ventricle:  The cavity size was normal. There was moderate focal basal hypertrophy. Systolic function was normal. The estimated ejection fraction was in the range of 55% to 60%. Wall motion was normal; there were no regional wall motion abnormalities. Features are consistent with a pseudonormal left ventricular filling pattern, with concomitant abnormal relaxation and increased filling pressure (grade 2 diastolic dysfunction). Doppler parameters are consistent with high ventricular filling  pressure.  ------------------------------------------------------------------- Aortic valve:   Normal thickness leaflets. A mechanical prosthesis was present and functioning normally. Mobility was not restricted. Doppler:  Transvalvular velocity was within the normal range. There was no stenosis. There was no regurgitation.    VTI ratio of LVOT to aortic valve: 0.57. Peak velocity ratio of LVOT to aortic valve: 0.52. Mean velocity ratio of LVOT to aortic valve: 0.54.    Mean gradient (S): 7 mm Hg. Peak gradient (S): 15 mm Hg.  ------------------------------------------------------------------- Aorta:  Aortic root: The aortic root was normal in size.  ------------------------------------------------------------------- Mitral valve:   Structurally normal valve.   Mobility was not restricted.  Doppler:  Transvalvular velocity was within the normal range. There was no evidence for stenosis. There was no regurgitation.    Valve area by pressure half-time: 2.37 cm^2. Indexed valve  area by pressure half-time: 1.04 cm^2/m^2.    Peak gradient (D): 4 mm Hg.  ------------------------------------------------------------------- Left atrium:  The atrium was mildly dilated.  ------------------------------------------------------------------- Right ventricle:  The cavity size was normal. Wall thickness was normal. Systolic function was normal.  ------------------------------------------------------------------- Pulmonic valve:    Structurally normal valve.   Cusp separation was normal.  Doppler:  Transvalvular velocity was within the normal range. There was no evidence for stenosis. There was trivial regurgitation.  ------------------------------------------------------------------- Tricuspid valve:   Structurally normal valve.    Doppler: Transvalvular velocity was within the normal range. There was no regurgitation.  ------------------------------------------------------------------- Pulmonary artery:   The main pulmonary artery was normal-sized. Systolic pressure could not be accurately estimated.  ------------------------------------------------------------------- Right atrium:  The atrium was normal in size.  ------------------------------------------------------------------- Pericardium:  There was no pericardial effusion.  ------------------------------------------------------------------- Systemic veins: Inferior vena cava: The vessel was normal in size.   ASSESSMENT AND PLAN: 1.  Aortic valve disease status post mechanical aortic valve replacement: The patient is doing very well and tolerating chronic oral anticoagulation with warfarin as well as antiplatelet therapy with aspirin.  He follows SBE prophylaxis.  His recent echo is reviewed with a mean transvalvular gradient of 7 mmHg demonstrating normal function of his mechanical aortic prosthesis.  2.  Nonischemic cardiomyopathy: LV function has completely normalized.  He continues on an ACE  inhibitor and beta-blocker.  He seems to be tolerating his medical therapy quite well.  Current medicines are reviewed with the patient today.  The patient does not have concerns regarding medicines.  Labs/ tests ordered today include:   Orders Placed This Encounter  Procedures  . EKG 12-Lead    Disposition:   FU one year  Signed, Jeffrey Bollman, MD  06/19/2017 1:38 PM    [facility] [address] [place], [place], [place]  [address] Phone: [phone]; Fax: [phone]

## 2017-06-19 NOTE — Patient Instructions (Signed)

## 2017-06-26 NOTE — Progress Notes (Signed)
Subjective:  Patient ID: Jeffrey [name], male    DOB: 07/22/1965,  MRN: [id number]  Chief Complaint  Patient presents with  . Gout    possible gout referred by Dr. Randon Goldsmith matter which dr he saw   52 y.o. male presents with the above complaint. Referred for evaluation for gout. Reports pain and swelling in the left great toe joint. Denies prior treatments. Denies N/V/F/Ch. On chronic coumadin therapy.  Past Medical History:  Diagnosis Date  . Aortic insufficiency    a. 02/2014 - admx with sx severe AI and assoc ascending aortic aneurysm (6.8 cm) >> s/p Bentall with mechancial AVR (Dr. Donata Clay)  . Hx of cardiac catheterization    a. LHC (11/15): EF 55%, marked ascending aortic dilatation with severe AI on aortic root angiography, normal coronary arteries  . Hx of echocardiogram    a. Echo 11/15: EF 55%, severe AI, marked dilation of ascending aorta; b. Echo 12/15: EF 30-35%, large effusion; c. Echo 2/16: EF 40-45%, no RWMA, mechanical AVR ok, trivial effusion post to heart   . NICM (nonischemic cardiomyopathy) (HCC)    a. TEE (11/15): Mild LVH, EF 45-50%, diffuse HK, prominent apical trabeculations, severe AI, severely dilated ascending aorta (6.9 cm), mild MR;  b. EF 30-35% post AVR >> c. EF 40-45% by echo 05/2014  . Pericardial effusion    large pericardial effusion post Bentall procedure >> s/p pericardial window  . Thoracic ascending aortic aneurysm (HCC)    Echo 11/15: severe AI, severely dilated ascending aorta (6.5 cm);  s/p Bentall 02/2014 with aortic root replacement using a mechanical valve-conduit and reimplantation of coronary arteries (St. Jude 27 mm valve), replacement of ascending aorta to the proximal arch with a 30 mm Dacron graft   Past Surgical History:  Procedure Laterality Date  . BENTALL PROCEDURE N/A 02/24/2014   Procedure: BENTALL PROCEDURE;  Surgeon: Kerin Perna, MD;  Location: [facility] OR;  Service: Open Heart Surgery;  Laterality: N/A;  .  INTRAOPERATIVE TRANSESOPHAGEAL ECHOCARDIOGRAM N/A 02/24/2014   Procedure: INTRAOPERATIVE TRANSESOPHAGEAL ECHOCARDIOGRAM;  Surgeon: Kerin Perna, MD;  Location: [facility] OR;  Service: Open Heart Surgery;  Laterality: N/A;  . LEFT AND RIGHT HEART CATHETERIZATION WITH CORONARY ANGIOGRAM N/A 02/20/2014   Procedure: LEFT AND RIGHT HEART CATHETERIZATION WITH CORONARY ANGIOGRAM;  Surgeon: Micheline Chapman, MD;  Location: [facility] CATH LAB;  Service: Cardiovascular;  Laterality: N/A;  . SUBXYPHOID PERICARDIAL WINDOW N/A 04/02/2014   Procedure: SUBXYPHOID PERICARDIAL WINDOW;  Surgeon: Kerin Perna, MD;  Location: [facility] OR;  Service: Thoracic;  Laterality: N/A;  . TEE WITHOUT CARDIOVERSION N/A 02/20/2014   Procedure: TRANSESOPHAGEAL ECHOCARDIOGRAM (TEE);  Surgeon: Lars Masson, MD;  Location: [facility] ENDOSCOPY;  Service: Cardiovascular;  Laterality: N/A;  . TEE WITHOUT CARDIOVERSION N/A 04/02/2014   Procedure: TRANSESOPHAGEAL ECHOCARDIOGRAM (TEE);  Surgeon: Kerin Perna, MD;  Location: [facility] OR;  Service: Thoracic;  Laterality: N/A;    Current Outpatient Medications:  .  acetaminophen (TYLENOL) 500 MG tablet, Take 500 mg by mouth every 6 (six) hours as needed for mild pain or fever. , Disp: , Rfl:  .  allopurinol (ZYLOPRIM) 100 MG tablet, Take 300 mg by mouth daily, Disp: , Rfl:  .  amoxicillin (AMOXIL) 500 MG tablet, Take all 4 tablets one hour prior to procedure, Disp: 4 tablet, Rfl: 6 .  aspirin 81 MG EC tablet, Take 1 tablet (81 mg total) by mouth daily., Disp: , Rfl:  .  carvedilol (COREG) 12.5 MG tablet, Take 1 tablet (  12.5 mg total) by mouth 2 (two) times daily., Disp: 180 tablet, Rfl: 3 .  lisinopril (PRINIVIL,ZESTRIL) 5 MG tablet, Take 1 tablet (5 mg total) by mouth daily., Disp: 90 tablet, Rfl: 3 .  warfarin (COUMADIN) 7.5 MG tablet, Take as directed by Coumadin Clinic, Disp: 100 tablet, Rfl: 1 .  zolpidem (AMBIEN) 10 MG tablet, Take 5 mg by mouth daily., Disp: , Rfl: 0  No Known Allergies Review of  Systems Objective:  There were no vitals filed for this visit. General AA&O x3. Normal mood and affect.  Vascular Dorsalis pedis and posterior tibial pulses  present 2+ bilaterally  Capillary refill normal to all digits. Pedal hair growth normal.  Neurologic Epicritic sensation grossly present.  Dermatologic No open lesions. Interspaces clear of maceration. Nails well groomed and normal in appearance.  Orthopedic: MMT 5/5 in dorsiflexion, plantarflexion, inversion, and eversion. Pain on ROM L 1st MPJ   Assessment & Plan:  Patient was evaluated and treated and all questions answered.  Gout L 1st MPJ -Injeciton delivered as below. -XR taken and reviewed. No acute fractures or dislocations. -Educated on signs and symptoms of gout. Educated on gout diet.  Procedure: Joint Injection Location: Left 1st MPJ joint Skin Prep: Alcohol. Injectate: 0.5 cc 1% lidocaine plain, 0.5 cc dexamethasone phosphate. Disposition: Patient tolerated procedure well. Injection site dressed with a band-aid.  Return in about 3 weeks (around 05/04/2017) for Gout.

## 2017-07-19 LAB — PROTIME-INR: INR: 2.5 — AB (ref 0.9–1.1)

## 2017-07-20 ENCOUNTER — Ambulatory Visit (INDEPENDENT_AMBULATORY_CARE_PROVIDER_SITE_OTHER): Payer: BLUE CROSS/BLUE SHIELD | Admitting: Cardiology

## 2017-07-20 DIAGNOSIS — Z952 Presence of prosthetic heart valve: Secondary | ICD-10-CM | POA: Diagnosis not present

## 2017-07-20 DIAGNOSIS — Z7901 Long term (current) use of anticoagulants: Secondary | ICD-10-CM | POA: Diagnosis not present

## 2017-07-20 NOTE — Patient Instructions (Signed)
Description   Spoke with patient and advised to Continue taking 1 tablet daily except 1.5 tablets on Mondays.  Recheck INR in 4 weeks at [facility] in [place]. Call with any changes [phone].      

## 2017-07-28 ENCOUNTER — Other Ambulatory Visit: Payer: Self-pay | Admitting: Cardiovascular Disease

## 2017-08-09 ENCOUNTER — Encounter: Payer: Self-pay | Admitting: Cardiovascular Disease

## 2017-08-10 ENCOUNTER — Encounter: Payer: Self-pay | Admitting: Cardiovascular Disease

## 2017-08-10 ENCOUNTER — Telehealth: Payer: Self-pay | Admitting: *Deleted

## 2017-08-10 NOTE — Telephone Encounter (Signed)
Left message on pt's phone that had changed appt to May 20th at 11am and left our phone number to call with any further questions

## 2017-08-10 NOTE — Telephone Encounter (Signed)
Spoke with pt and made an appt for him to be seen in coumadin clinic on May 13th as he requested

## 2017-08-28 ENCOUNTER — Ambulatory Visit (INDEPENDENT_AMBULATORY_CARE_PROVIDER_SITE_OTHER): Payer: BLUE CROSS/BLUE SHIELD | Admitting: Pharmacist

## 2017-08-28 DIAGNOSIS — Z5181 Encounter for therapeutic drug level monitoring: Secondary | ICD-10-CM

## 2017-08-28 DIAGNOSIS — Z7901 Long term (current) use of anticoagulants: Secondary | ICD-10-CM | POA: Diagnosis not present

## 2017-08-28 DIAGNOSIS — I719 Aortic aneurysm of unspecified site, without rupture: Secondary | ICD-10-CM

## 2017-08-28 DIAGNOSIS — Z952 Presence of prosthetic heart valve: Secondary | ICD-10-CM

## 2017-08-28 LAB — POCT INR
INR POC, (Mech Heart Valve Range): 2.8 (ref 2.5–3.5)
INR: 2.8 (ref 2.0–3.0)

## 2017-08-28 MED ORDER — WARFARIN SODIUM 7.5 MG PO TABS: ORAL_TABLET | ORAL | 0 refills | Status: DC

## 2017-08-28 NOTE — Patient Instructions (Signed)
Description   Continue taking 1 tablet daily except 1.5 tablets on Mondays.  Recheck INR in 4 weeks. Call with any changes [phone].

## 2017-10-02 ENCOUNTER — Ambulatory Visit (INDEPENDENT_AMBULATORY_CARE_PROVIDER_SITE_OTHER): Payer: BLUE CROSS/BLUE SHIELD | Admitting: *Deleted

## 2017-10-02 DIAGNOSIS — Z952 Presence of prosthetic heart valve: Secondary | ICD-10-CM | POA: Diagnosis not present

## 2017-10-02 DIAGNOSIS — I719 Aortic aneurysm of unspecified site, without rupture: Secondary | ICD-10-CM | POA: Diagnosis not present

## 2017-10-02 DIAGNOSIS — Z5181 Encounter for therapeutic drug level monitoring: Secondary | ICD-10-CM | POA: Diagnosis not present

## 2017-10-02 DIAGNOSIS — Z7901 Long term (current) use of anticoagulants: Secondary | ICD-10-CM

## 2017-10-02 LAB — POCT INR: INR: 2.5 (ref 2.0–3.0)

## 2017-10-02 NOTE — Patient Instructions (Signed)
Description   Continue taking 1 tablet daily except 1.5 tablets on Mondays.  Recheck INR in 6 weeks. Call with any changes [phone].      

## 2017-11-10 ENCOUNTER — Ambulatory Visit (INDEPENDENT_AMBULATORY_CARE_PROVIDER_SITE_OTHER): Payer: BLUE CROSS/BLUE SHIELD | Admitting: *Deleted

## 2017-11-10 DIAGNOSIS — I719 Aortic aneurysm of unspecified site, without rupture: Secondary | ICD-10-CM | POA: Diagnosis not present

## 2017-11-10 DIAGNOSIS — Z5181 Encounter for therapeutic drug level monitoring: Secondary | ICD-10-CM

## 2017-11-10 DIAGNOSIS — Z952 Presence of prosthetic heart valve: Secondary | ICD-10-CM

## 2017-11-10 DIAGNOSIS — Z7901 Long term (current) use of anticoagulants: Secondary | ICD-10-CM

## 2017-11-10 LAB — POCT INR: INR: 1.8 — AB (ref 2.0–3.0)

## 2017-11-10 NOTE — Patient Instructions (Signed)
Description   Today take 1.5 tablets then continue taking 1 tablet daily except 1.5 tablets on Mondays.  Recheck INR in 3 weeks while [place]. Call with any changes [phone].

## 2017-11-12 ENCOUNTER — Other Ambulatory Visit: Payer: Self-pay | Admitting: Cardiovascular Disease

## 2017-11-29 ENCOUNTER — Ambulatory Visit (INDEPENDENT_AMBULATORY_CARE_PROVIDER_SITE_OTHER): Payer: BLUE CROSS/BLUE SHIELD

## 2017-11-29 DIAGNOSIS — Z7901 Long term (current) use of anticoagulants: Secondary | ICD-10-CM

## 2017-11-29 DIAGNOSIS — Z952 Presence of prosthetic heart valve: Secondary | ICD-10-CM

## 2017-11-29 LAB — PROTIME-INR: INR: 2.6 — AB (ref 0.9–1.1)

## 2017-11-29 NOTE — Patient Instructions (Addendum)
  Description   Continue taking 1 tablet daily except 1.5 tablets on Mondays.  Recheck INR in 4 weeks while [place]. Call with any changes [phone].

## 2017-12-15 DIAGNOSIS — Z125 Encounter for screening for malignant neoplasm of prostate: Secondary | ICD-10-CM | POA: Diagnosis not present

## 2017-12-15 DIAGNOSIS — Z23 Encounter for immunization: Secondary | ICD-10-CM | POA: Diagnosis not present

## 2017-12-15 DIAGNOSIS — M109 Gout, unspecified: Secondary | ICD-10-CM | POA: Diagnosis not present

## 2017-12-15 DIAGNOSIS — Z0001 Encounter for general adult medical examination with abnormal findings: Secondary | ICD-10-CM | POA: Diagnosis not present

## 2017-12-15 DIAGNOSIS — Z954 Presence of other heart-valve replacement: Secondary | ICD-10-CM | POA: Diagnosis not present

## 2017-12-15 DIAGNOSIS — Z79899 Other long term (current) drug therapy: Secondary | ICD-10-CM | POA: Diagnosis not present

## 2017-12-15 DIAGNOSIS — E781 Pure hyperglyceridemia: Secondary | ICD-10-CM | POA: Diagnosis not present

## 2017-12-15 DIAGNOSIS — R7303 Prediabetes: Secondary | ICD-10-CM | POA: Diagnosis not present

## 2017-12-18 ENCOUNTER — Other Ambulatory Visit: Payer: Self-pay | Admitting: Cardiovascular Disease

## 2017-12-21 ENCOUNTER — Ambulatory Visit (INDEPENDENT_AMBULATORY_CARE_PROVIDER_SITE_OTHER): Payer: BLUE CROSS/BLUE SHIELD | Admitting: Pharmacist

## 2017-12-21 DIAGNOSIS — I719 Aortic aneurysm of unspecified site, without rupture: Secondary | ICD-10-CM

## 2017-12-21 DIAGNOSIS — Z5181 Encounter for therapeutic drug level monitoring: Secondary | ICD-10-CM

## 2017-12-21 DIAGNOSIS — Z952 Presence of prosthetic heart valve: Secondary | ICD-10-CM

## 2017-12-21 DIAGNOSIS — Z7901 Long term (current) use of anticoagulants: Secondary | ICD-10-CM

## 2017-12-21 LAB — POCT INR: INR: 2.1 (ref 2.0–3.0)

## 2017-12-21 NOTE — Patient Instructions (Signed)
Description   Continue taking 1 tablet daily except 1.5 tablets on Mondays.  Recheck INR in 5 weeks. Call with any changes [phone].

## 2018-01-12 ENCOUNTER — Telehealth: Payer: Self-pay | Admitting: Cardiovascular Disease

## 2018-01-12 NOTE — Telephone Encounter (Signed)
Faxed written order to [facility] to draw PT/INR 01/29/18 and weekly or as needed x 80mos. Fax results to [phone].

## 2018-01-12 NOTE — Telephone Encounter (Signed)
° ° °  [facility] calling to request verbal order/ fax for INR; the frequency, duration needed. Please call [phone] Fax #[phone]

## 2018-01-29 ENCOUNTER — Ambulatory Visit (INDEPENDENT_AMBULATORY_CARE_PROVIDER_SITE_OTHER): Payer: BLUE CROSS/BLUE SHIELD | Admitting: Pharmacist

## 2018-01-29 DIAGNOSIS — I719 Aortic aneurysm of unspecified site, without rupture: Secondary | ICD-10-CM

## 2018-01-29 DIAGNOSIS — Z7901 Long term (current) use of anticoagulants: Secondary | ICD-10-CM

## 2018-01-29 DIAGNOSIS — Z5181 Encounter for therapeutic drug level monitoring: Secondary | ICD-10-CM

## 2018-01-29 DIAGNOSIS — Z952 Presence of prosthetic heart valve: Secondary | ICD-10-CM

## 2018-01-29 LAB — POCT INR: INR: 2.4 (ref 2.0–3.0)

## 2018-01-29 NOTE — Patient Instructions (Signed)
Description   Continue taking 1 tablet daily except 1.5 tablets on Mondays.  Recheck INR in 5 weeks. Call with any changes [phone].      

## 2018-02-02 DIAGNOSIS — H35033 Hypertensive retinopathy, bilateral: Secondary | ICD-10-CM | POA: Diagnosis not present

## 2018-03-07 ENCOUNTER — Ambulatory Visit (INDEPENDENT_AMBULATORY_CARE_PROVIDER_SITE_OTHER): Payer: BLUE CROSS/BLUE SHIELD

## 2018-03-07 ENCOUNTER — Telehealth: Payer: Self-pay | Admitting: Cardiovascular Disease

## 2018-03-07 DIAGNOSIS — Z7901 Long term (current) use of anticoagulants: Secondary | ICD-10-CM

## 2018-03-07 DIAGNOSIS — Z952 Presence of prosthetic heart valve: Secondary | ICD-10-CM

## 2018-03-07 LAB — POCT INR: INR: 2.1 (ref 2.0–3.0)

## 2018-03-07 NOTE — Telephone Encounter (Signed)
Informed patient that no repeat echo was ordered at his last OV (echo March 2019 showed normal functioning AV and normal EF).  Scheduled patient for 1 year follow-up with Dr. [name] June 18, 2018. He understands follow-up echo will be ordered at that time if Dr. [name] thinks clinically indicated. He was grateful for assistance.

## 2018-03-07 NOTE — Telephone Encounter (Signed)
Patient rec'd recall letter to schedule March appt. He is asking should he have an echo before the appt with Dr.Cooper.

## 2018-03-07 NOTE — Patient Instructions (Signed)
Description   Continue taking 1 tablet daily except 1.5 tablets on Mondays.  Recheck INR in 6 weeks. Call with any changes [phone].

## 2018-03-30 DIAGNOSIS — Z1212 Encounter for screening for malignant neoplasm of rectum: Secondary | ICD-10-CM | POA: Diagnosis not present

## 2018-03-30 DIAGNOSIS — Z1211 Encounter for screening for malignant neoplasm of colon: Secondary | ICD-10-CM | POA: Diagnosis not present

## 2018-04-13 ENCOUNTER — Ambulatory Visit (INDEPENDENT_AMBULATORY_CARE_PROVIDER_SITE_OTHER): Payer: BLUE CROSS/BLUE SHIELD

## 2018-04-13 DIAGNOSIS — Z7901 Long term (current) use of anticoagulants: Secondary | ICD-10-CM | POA: Diagnosis not present

## 2018-04-13 DIAGNOSIS — Z952 Presence of prosthetic heart valve: Secondary | ICD-10-CM | POA: Diagnosis not present

## 2018-04-13 LAB — POCT INR: INR: 1.8 — AB (ref 2.0–3.0)

## 2018-04-13 NOTE — Patient Instructions (Signed)
Description   Take 1.5 tablets today, then resume same dosage 1 tablet daily except 1.5 tablets on Mondays.  Recheck INR in 4 weeks. Call with any changes [phone].

## 2018-04-21 DIAGNOSIS — L0291 Cutaneous abscess, unspecified: Secondary | ICD-10-CM | POA: Diagnosis not present

## 2018-05-08 DIAGNOSIS — Z954 Presence of other heart-valve replacement: Secondary | ICD-10-CM | POA: Diagnosis not present

## 2018-05-08 DIAGNOSIS — R739 Hyperglycemia, unspecified: Secondary | ICD-10-CM | POA: Diagnosis not present

## 2018-05-08 DIAGNOSIS — L0591 Pilonidal cyst without abscess: Secondary | ICD-10-CM | POA: Diagnosis not present

## 2018-05-08 DIAGNOSIS — R7303 Prediabetes: Secondary | ICD-10-CM | POA: Diagnosis not present

## 2018-05-11 ENCOUNTER — Ambulatory Visit (INDEPENDENT_AMBULATORY_CARE_PROVIDER_SITE_OTHER): Payer: BLUE CROSS/BLUE SHIELD | Admitting: Pharmacist

## 2018-05-11 DIAGNOSIS — Z952 Presence of prosthetic heart valve: Secondary | ICD-10-CM

## 2018-05-11 DIAGNOSIS — Z7901 Long term (current) use of anticoagulants: Secondary | ICD-10-CM | POA: Diagnosis not present

## 2018-05-11 LAB — POCT INR: INR: 1.5 — AB (ref 2.0–3.0)

## 2018-05-11 NOTE — Patient Instructions (Signed)
Take 1.5 tablets today, then start 1 tablet daily except 1.5 tablets on Mondays and Wed.  Recheck INR in 1 week. Call with any changes [phone].

## 2018-05-18 ENCOUNTER — Ambulatory Visit (INDEPENDENT_AMBULATORY_CARE_PROVIDER_SITE_OTHER): Payer: BLUE CROSS/BLUE SHIELD | Admitting: Cardiology

## 2018-05-18 DIAGNOSIS — Z7901 Long term (current) use of anticoagulants: Secondary | ICD-10-CM

## 2018-05-18 DIAGNOSIS — Z952 Presence of prosthetic heart valve: Secondary | ICD-10-CM

## 2018-05-18 LAB — PROTIME-INR: INR: 2 — AB (ref 0.9–1.1)

## 2018-05-18 NOTE — Patient Instructions (Signed)
Description   Spoke with patient and advised him to continue taking 1 tablet daily except 1.5 tablets on Mondays and Wednesdays.  Recheck INR in 3 weeks. Call with any changes [phone].

## 2018-05-29 DIAGNOSIS — Z954 Presence of other heart-valve replacement: Secondary | ICD-10-CM | POA: Diagnosis not present

## 2018-05-29 DIAGNOSIS — E119 Type 2 diabetes mellitus without complications: Secondary | ICD-10-CM | POA: Diagnosis not present

## 2018-05-29 DIAGNOSIS — L0591 Pilonidal cyst without abscess: Secondary | ICD-10-CM | POA: Diagnosis not present

## 2018-06-07 LAB — POCT INR: INR: 1.5 — AB (ref 2.0–3.0)

## 2018-06-08 ENCOUNTER — Ambulatory Visit (INDEPENDENT_AMBULATORY_CARE_PROVIDER_SITE_OTHER): Payer: BLUE CROSS/BLUE SHIELD | Admitting: Internal Medicine

## 2018-06-08 DIAGNOSIS — Z952 Presence of prosthetic heart valve: Secondary | ICD-10-CM

## 2018-06-08 DIAGNOSIS — Z7901 Long term (current) use of anticoagulants: Secondary | ICD-10-CM | POA: Diagnosis not present

## 2018-06-08 LAB — POCT INR: INR: 1.6 — AB (ref 2.0–3.0)

## 2018-06-18 ENCOUNTER — Ambulatory Visit: Payer: BLUE CROSS/BLUE SHIELD | Admitting: Cardiovascular Disease

## 2018-06-28 ENCOUNTER — Other Ambulatory Visit: Payer: Self-pay

## 2018-06-28 ENCOUNTER — Ambulatory Visit (INDEPENDENT_AMBULATORY_CARE_PROVIDER_SITE_OTHER): Payer: BLUE CROSS/BLUE SHIELD

## 2018-06-28 DIAGNOSIS — Z7901 Long term (current) use of anticoagulants: Secondary | ICD-10-CM | POA: Diagnosis not present

## 2018-06-28 DIAGNOSIS — Z952 Presence of prosthetic heart valve: Secondary | ICD-10-CM | POA: Diagnosis not present

## 2018-06-28 LAB — POCT INR: INR: 1.9 — AB (ref 2.0–3.0)

## 2018-06-28 NOTE — Patient Instructions (Signed)
Description   Take 1.5 tablets today, then resume same dosage 1 tablet daily except 1.5 tablets on Mondays, Wednesdays and Fridays.  Recheck INR in 3 weeks. Call with any changes [phone]. Keep the same diet you have been eating. We have adjusted your dose based off of your new diet.

## 2018-06-30 ENCOUNTER — Other Ambulatory Visit: Payer: Self-pay | Admitting: Cardiovascular Disease

## 2018-06-30 DIAGNOSIS — I428 Other cardiomyopathies: Secondary | ICD-10-CM

## 2018-06-30 DIAGNOSIS — Z952 Presence of prosthetic heart valve: Secondary | ICD-10-CM

## 2018-07-02 ENCOUNTER — Telehealth: Payer: Self-pay

## 2018-07-02 NOTE — Telephone Encounter (Signed)
In light of COVID-19 pandemic, the patient wishes to cancel 3/25 appointment and reschedule once precautions are lifted. He understands he will be called at a later date to reschedule.  He was grateful for call and agrees with treatment plan.

## 2018-07-04 ENCOUNTER — Ambulatory Visit: Payer: BLUE CROSS/BLUE SHIELD | Admitting: Cardiovascular Disease

## 2018-07-17 ENCOUNTER — Telehealth: Payer: Self-pay | Admitting: *Deleted

## 2018-07-17 NOTE — Telephone Encounter (Signed)
1. Do you currently have a fever? No (yes = cancel and refer to pcp for e-visit) 2. Have you recently travelled on a cruise, internationally, or to [place], [place], [place], [place], [place], or [place], [place] [employer])? No (yes = cancel, stay home, monitor symptoms, and contact pcp or initiate e-visit if symptoms develop) 3. Have you been in contact with someone that is currently pending confirmation of Covid19 testing or has been confirmed to have the Covid19 virus? No (yes = cancel, stay home, away from tested individual, monitor symptoms, and contact pcp or initiate e-visit if symptoms develop) 4. Are you currently experiencing fatigue or cough? No (yes = pt should be prepared to have a mask placed at the time of their visit).  Pt. Advised that we are restricting visitors at this time and anyone present in the vehicle should meet the above criteria as well. Advised that visit will be at curbside for finger stick ONLY and will receive call with instructions. Pt also advised to please bring own pen for signature of arrival document.   Explained to pt to drive up under covered area and changed his appt to the afternoon as the office will not open until 1pm that day.

## 2018-07-19 ENCOUNTER — Ambulatory Visit (INDEPENDENT_AMBULATORY_CARE_PROVIDER_SITE_OTHER): Payer: BLUE CROSS/BLUE SHIELD | Admitting: *Deleted

## 2018-07-19 ENCOUNTER — Other Ambulatory Visit: Payer: Self-pay

## 2018-07-19 DIAGNOSIS — Z7901 Long term (current) use of anticoagulants: Secondary | ICD-10-CM

## 2018-07-19 DIAGNOSIS — Z952 Presence of prosthetic heart valve: Secondary | ICD-10-CM | POA: Diagnosis not present

## 2018-07-19 LAB — POCT INR: INR: 1.8 — AB (ref 2.0–3.0)

## 2018-07-19 NOTE — Patient Instructions (Addendum)
Description   Spoke with pt and instructed pt to take 1.5 tablets today, then start taking 1.5 tablets daily except 1 tablet on Saturdays, Tuesdays, and Thursdays. Recheck INR in 3 weeks. Call with any changes [phone]. Keep the same diet you have been eating. We have adjusted your dose based off of your new diet.

## 2018-08-01 NOTE — Telephone Encounter (Signed)
Scheduled the patient tomorrow for an evisit with Dr. [name].  The patient was grateful for assistance.  Consent obtained (see below and MyChart message).    1. Confirm consent - "In the setting of the current Covid19 crisis, you are scheduled for a (phone or video) visit with your provider on (date) at (time).  Just as we do with many in-office visits, in order for you to participate in this visit, we must obtain consent.  If you'd like, I can send this to your mychart (if signed up) or email for you to review.  Otherwise, I can obtain your verbal consent now.  All virtual visits are billed to your insurance company just like a normal visit would be.  By agreeing to a virtual visit, we'd like you to understand that the technology does not allow for your provider to perform an examination, and thus may limit your provider's ability to fully assess your condition. If your provider identifies any concerns that need to be evaluated in person, we will make arrangements to do so.  Finally, though the technology is pretty good, we cannot assure that it will always work on either your or our end, and in the setting of a video visit, we may have to convert it to a phone-only visit.  In either situation, we cannot ensure that we have a secure connection.  Are you willing to proceed?" STAFF: Did the patient verbally acknowledge consent to telehealth visit? Document YES/NO here: YES     TELEPHONE CALL NOTE  Jeffrey [name] has been deemed a candidate for a follow-up tele-health visit to limit community exposure during the Covid-19 pandemic. I spoke with the patient via phone to ensure availability of phone/video source, confirm preferred email & phone number, and discuss instructions and expectations.  I reminded Jeffrey [name] to be prepared with any vital sign and/or heart rhythm information that could potentially be obtained via home monitoring, at the time of his visit. I reminded Jeffrey [name] to  expect a phone call prior to his visit.  [name], RN 08/01/2018 12:01 PM    IF USING DOXIMITY or DOXY.ME - The patient will receive a link just prior to their visit by text.     FULL LENGTH CONSENT FOR TELE-HEALTH VISIT   I hereby voluntarily request, consent and authorize [facility] and its employed or contracted physicians, physician assistants, nurse practitioners or other licensed health care professionals (the Practitioner), to provide me with telemedicine health care services (the "Services") as deemed necessary by the treating Practitioner. I acknowledge and consent to receive the Services by the Practitioner via telemedicine. I understand that the telemedicine visit will involve communicating with the Practitioner through live audiovisual communication technology and the disclosure of certain medical information by electronic transmission. I acknowledge that I have been given the opportunity to request an in-person assessment or other available alternative prior to the telemedicine visit and am voluntarily participating in the telemedicine visit.  I understand that I have the right to withhold or withdraw my consent to the use of telemedicine in the course of my care at any time, without affecting my right to future care or treatment, and that the Practitioner or I may terminate the telemedicine visit at any time. I understand that I have the right to inspect all information obtained and/or recorded in the course of the telemedicine visit and may receive copies of available information for a reasonable fee.  I understand that some of the potential risks of  receiving the Services via telemedicine include:  Marland Kitchen Delay or interruption in medical evaluation due to technological equipment failure or disruption; . Information transmitted may not be sufficient (e.g. poor resolution of images) to allow for appropriate medical decision making by the Practitioner; and/or  . In rare  instances, security protocols could fail, causing a breach of personal health information.  Furthermore, I acknowledge that it is my responsibility to provide information about my medical history, conditions and care that is complete and accurate to the best of my ability. I acknowledge that Practitioner's advice, recommendations, and/or decision may be based on factors not within their control, such as incomplete or inaccurate data provided by me or distortions of diagnostic images or specimens that may result from electronic transmissions. I understand that the practice of medicine is not an exact science and that Practitioner makes no warranties or guarantees regarding treatment outcomes. I acknowledge that I will receive a copy of this consent concurrently upon execution via email to the email address I last provided but may also request a printed copy by calling the office of [facility].    I understand that my insurance will be billed for this visit.   I have read or had this consent read to me. . I understand the contents of this consent, which adequately explains the benefits and risks of the Services being provided via telemedicine.  . I have been provided ample opportunity to ask questions regarding this consent and the Services and have had my questions answered to my satisfaction. . I give my informed consent for the services to be provided through the use of telemedicine in my medical care  By participating in this telemedicine visit I agree to the above.

## 2018-08-02 ENCOUNTER — Telehealth (INDEPENDENT_AMBULATORY_CARE_PROVIDER_SITE_OTHER): Payer: BLUE CROSS/BLUE SHIELD | Admitting: Cardiovascular Disease

## 2018-08-02 ENCOUNTER — Telehealth: Payer: Self-pay | Admitting: Cardiovascular Disease

## 2018-08-02 ENCOUNTER — Encounter: Payer: Self-pay | Admitting: Cardiovascular Disease

## 2018-08-02 ENCOUNTER — Other Ambulatory Visit: Payer: Self-pay

## 2018-08-02 VITALS — BP 149/88 | HR 65 | Ht 70.0 in | Wt 192.0 lb

## 2018-08-02 DIAGNOSIS — Z952 Presence of prosthetic heart valve: Secondary | ICD-10-CM

## 2018-08-02 DIAGNOSIS — I719 Aortic aneurysm of unspecified site, without rupture: Secondary | ICD-10-CM

## 2018-08-02 NOTE — Telephone Encounter (Signed)
Follow Up: ° ° ° °Returning call from today. °

## 2018-08-02 NOTE — Addendum Note (Signed)
Addended by: Gunnar Fusi A on: 08/02/2018 05:13 PM   Modules accepted: Orders

## 2018-08-02 NOTE — Patient Instructions (Signed)
Medication Instructions:  Your provider recommends that you continue on your current medications as directed. Please refer to the Current Medication list given to you today.    Testing/Procedures: Your provider has requested that you have an echocardiogram in one year. Echocardiography is a painless test that uses sound waves to create images of your heart. It provides your doctor with information about the size and shape of your heart and how well your heart's chambers and valves are working. This procedure takes approximately one hour. There are no restrictions for this procedure.  Follow-Up: You will be called when Dr. Earmon Phoenix schedule is available for next Spring to schedule your 1 year echo and office visit.

## 2018-08-02 NOTE — Progress Notes (Signed)
Virtual Visit via Video Note   This visit type was conducted due to national recommendations for restrictions regarding the COVID-19 Pandemic (e.g. social distancing) in an effort to limit this patient's exposure and mitigate transmission in our community.  Due to his co-morbid illnesses, this patient is at least at moderate risk for complications without adequate follow up.  This format is felt to be most appropriate for this patient at this time.  All issues noted in this document were discussed and addressed.  A limited physical exam was performed with this format.  Please refer to the patient's chart for his consent to telehealth for [facility].   Evaluation Performed:  Follow-up visit  Date:  08/02/2018   ID:  Jeffrey [name], DOB [birth year], MRN [id number]  Patient Location: Home Provider Location: Home  PCP:  Marden Noble, MD  Cardiologist:  No primary care provider on file.  Electrophysiologist:  None   Chief Complaint: Follow-up aortic valve disease  History of Present Illness:    Jeffrey [name] is a 53 y.o. male with a history of ascending thoracic aortic aneurysm and severe aortic valve insufficiency who underwent Bentall surgery with a Saint Jude 27 mm mechanical valve and a 30 mm Dacron aortic graft in 2015.  The patient does not have symptoms concerning for COVID-19 infection (fever, chills, cough, or new shortness of breath).   He has developed Type II DM and has noted better glycemic control since starting metformin and dietary changes. He states fasting blood sugars have been less than 100 since he's made these changes.  He has lost 30 pounds.  Today, he denies symptoms of palpitations, chest pain, shortness of breath, orthopnea, PND, lower extremity edema, dizziness, or syncope.  Past Medical History:  Diagnosis Date  . Aortic insufficiency    a. 02/2014 - admx with sx severe AI and assoc ascending aortic aneurysm (6.8 cm) >> s/p Bentall with mechancial  AVR (Dr. Donata Clay)  . Hx of cardiac catheterization    a. LHC (11/15): EF 55%, marked ascending aortic dilatation with severe AI on aortic root angiography, normal coronary arteries  . Hx of echocardiogram    a. Echo 11/15: EF 55%, severe AI, marked dilation of ascending aorta; b. Echo 12/15: EF 30-35%, large effusion; c. Echo 2/16: EF 40-45%, no RWMA, mechanical AVR ok, trivial effusion post to heart   . NICM (nonischemic cardiomyopathy) (HCC)    a. TEE (11/15): Mild LVH, EF 45-50%, diffuse HK, prominent apical trabeculations, severe AI, severely dilated ascending aorta (6.9 cm), mild MR;  b. EF 30-35% post AVR >> c. EF 40-45% by echo 05/2014  . Pericardial effusion    large pericardial effusion post Bentall procedure >> s/p pericardial window  . Thoracic ascending aortic aneurysm (HCC)    Echo 11/15: severe AI, severely dilated ascending aorta (6.5 cm);  s/p Bentall 02/2014 with aortic root replacement using a mechanical valve-conduit and reimplantation of coronary arteries (St. Jude 27 mm valve), replacement of ascending aorta to the proximal arch with a 30 mm Dacron graft   Past Surgical History:  Procedure Laterality Date  . BENTALL PROCEDURE N/A 02/24/2014   Procedure: BENTALL PROCEDURE;  Surgeon: Kerin Perna, MD;  Location: [facility] OR;  Service: Open Heart Surgery;  Laterality: N/A;  . INTRAOPERATIVE TRANSESOPHAGEAL ECHOCARDIOGRAM N/A 02/24/2014   Procedure: INTRAOPERATIVE TRANSESOPHAGEAL ECHOCARDIOGRAM;  Surgeon: Kerin Perna, MD;  Location: [facility] OR;  Service: Open Heart Surgery;  Laterality: N/A;  . LEFT AND RIGHT HEART CATHETERIZATION WITH CORONARY  ANGIOGRAM N/A 02/20/2014   Procedure: LEFT AND RIGHT HEART CATHETERIZATION WITH CORONARY ANGIOGRAM;  Surgeon: Micheline Chapman, MD;  Location: [facility] CATH LAB;  Service: Cardiovascular;  Laterality: N/A;  . SUBXYPHOID PERICARDIAL WINDOW N/A 04/02/2014   Procedure: SUBXYPHOID PERICARDIAL WINDOW;  Surgeon: Kerin Perna, MD;  Location: [facility] OR;   Service: Thoracic;  Laterality: N/A;  . TEE WITHOUT CARDIOVERSION N/A 02/20/2014   Procedure: TRANSESOPHAGEAL ECHOCARDIOGRAM (TEE);  Surgeon: Lars Masson, MD;  Location: [facility] ENDOSCOPY;  Service: Cardiovascular;  Laterality: N/A;  . TEE WITHOUT CARDIOVERSION N/A 04/02/2014   Procedure: TRANSESOPHAGEAL ECHOCARDIOGRAM (TEE);  Surgeon: Kerin Perna, MD;  Location: [facility] OR;  Service: Thoracic;  Laterality: N/A;     Current Meds  Medication Sig  . acetaminophen (TYLENOL) 500 MG tablet Take 500 mg by mouth every 6 (six) hours as needed for mild pain or fever.   Marland Kitchen amoxicillin (AMOXIL) 500 MG tablet Take all 4 tablets one hour prior to procedure  . aspirin 81 MG EC tablet Take 1 tablet (81 mg total) by mouth daily.  . carvedilol (COREG) 12.5 MG tablet TAKE 1 TABLET(12.5 MG) BY MOUTH TWICE DAILY  . lisinopril (PRINIVIL,ZESTRIL) 5 MG tablet TAKE 1 TABLET(5 MG) BY MOUTH DAILY  . metFORMIN (GLUCOPHAGE-XR) 500 MG 24 hr tablet Take 1 tablet by mouth 2 (two) times a day.  . warfarin (COUMADIN) 7.5 MG tablet TAKE 1 TO 1 AND 1/2 TABLETS BY MOUTH OR AS DIRECTED BY COUMADIN CLINIC  . zolpidem (AMBIEN) 10 MG tablet Take 5 mg by mouth daily.     Allergies:   Patient has no known allergies.   Social History   Tobacco Use  . Smoking status: Never Smoker  . Smokeless tobacco: Never Used  Substance Use Topics  . Alcohol use: No  . Drug use: No     Family Hx: The patient's family history includes Cancer in his maternal grandmother. There is no history of Heart attack or Stroke.  ROS:   Please see the history of present illness.    All other systems reviewed and are negative.   Prior CV studies:   The following studies were reviewed today:  Echo 06/14/2017: Study Conclusions  - Procedure narrative: Transthoracic echocardiography. Image   quality was adequate. Intravenous contrast (Definity) was   administered. - Left ventricle: The cavity size was normal. There was moderate   focal basal  hypertrophy. Systolic function was normal. The   estimated ejection fraction was in the range of 55% to 60%. Wall   motion was normal; there were no regional wall motion   abnormalities. Features are consistent with a pseudonormal left   ventricular filling pattern, with concomitant abnormal relaxation   and increased filling pressure (grade 2 diastolic dysfunction).   Doppler parameters are consistent with high ventricular filling   pressure. - Aortic valve: A mechanical prosthesis was present and functioning   normally. Mean gradient (S): 7 mm Hg. Peak gradient (S): 15 mm   Hg. - Left atrium: The atrium was mildly dilated. - Pulmonic valve: There was trivial regurgitation. - Pulmonary arteries: Systolic pressure could not be accurately   estimated.   Labs/Other Tests and Data Reviewed:    EKG:  An ECG dated 06/19/2017 was personally reviewed today and demonstrated:  Normal sinus rhythm, ST and T wave abnormality consider lateral ischemia  Recent Labs: No results found for requested labs within last 8760 hours.   Recent Lipid Panel No results found for: CHOL, TRIG, HDL, CHOLHDL, LDLCALC,  LDLDIRECT  Wt Readings from Last 3 Encounters:  08/02/18 192 lb (87.1 kg)  06/19/17 223 lb 1.9 oz (101.2 kg)  07/20/16 226 lb 1.9 oz (102.6 kg)     Objective:    Vital Signs:  BP (!) 149/88   Pulse 65   Ht  (1.778 m)   Wt 192 lb (87.1 kg)   BMI 27.55 kg/m    VITAL SIGNS:  reviewed The patient is alert, oriented, in no distress.  He is breathing comfortably during conversation.  Remaining exam not performed as this is a virtual visit.  ASSESSMENT & PLAN:    1. Aortic valve disease status post mechanical aortic valve replacement: The patient is doing well on a combination of warfarin and aspirin without bleeding problems.  INR is are reviewed.  He is very compliant with his diet and with limiting green leafy vegetables.  I would like him to have an echocardiogram before his  follow-up visit in 1 year. 2. Ascending aortic aneurysm: Status post Bentall surgery. 3. Hypertension: Medications reviewed and will be continued without change 4. Type 2 diabetes: The patient has marked improvement in glycemic control with 30 pounds of weight loss and addition of metformin.  He is following a low glycemic diet.  COVID-19 Education: The signs and symptoms of COVID-19 were discussed with the patient and how to seek care for testing (follow up with PCP or arrange E-visit). The importance of social distancing was discussed today.  Time:   Today, I have spent 15 minutes with the patient with telehealth technology discussing the above problems.     Medication Adjustments/Labs and Tests Ordered: Current medicines are reviewed at length with the patient today.  Concerns regarding medicines are outlined above.   Tests Ordered: No orders of the defined types were placed in this encounter.   Medication Changes: No orders of the defined types were placed in this encounter.   Disposition:  Follow up in 1 year(s)  Signed, [name], MD  08/02/2018 4:28 PM    [facility]

## 2018-08-09 ENCOUNTER — Telehealth: Payer: Self-pay

## 2018-08-09 NOTE — Telephone Encounter (Signed)
Unable to lmom vm full 

## 2018-08-10 ENCOUNTER — Ambulatory Visit (INDEPENDENT_AMBULATORY_CARE_PROVIDER_SITE_OTHER): Payer: BLUE CROSS/BLUE SHIELD | Admitting: *Deleted

## 2018-08-10 ENCOUNTER — Other Ambulatory Visit: Payer: Self-pay

## 2018-08-10 DIAGNOSIS — Z952 Presence of prosthetic heart valve: Secondary | ICD-10-CM | POA: Diagnosis not present

## 2018-08-10 DIAGNOSIS — Z7901 Long term (current) use of anticoagulants: Secondary | ICD-10-CM

## 2018-08-10 LAB — POCT INR: INR: 2.5 (ref 2.0–3.0)

## 2018-08-10 NOTE — Patient Instructions (Signed)
Description   Spoke with pt and instructed pt to continue taking 1.5 tablets daily except 1 tablet on Saturdays, Tuesdays, and Thursdays. Recheck INR in 4 weeks. Call with any changes [phone]. Keep the same diet you have been eating. We have adjusted your dose based off of your new diet.

## 2018-08-28 DIAGNOSIS — L0591 Pilonidal cyst without abscess: Secondary | ICD-10-CM | POA: Diagnosis not present

## 2018-08-28 DIAGNOSIS — Z954 Presence of other heart-valve replacement: Secondary | ICD-10-CM | POA: Diagnosis not present

## 2018-08-28 DIAGNOSIS — E119 Type 2 diabetes mellitus without complications: Secondary | ICD-10-CM | POA: Diagnosis not present

## 2018-08-28 DIAGNOSIS — G4700 Insomnia, unspecified: Secondary | ICD-10-CM | POA: Diagnosis not present

## 2018-08-29 DIAGNOSIS — E119 Type 2 diabetes mellitus without complications: Secondary | ICD-10-CM | POA: Diagnosis not present

## 2018-09-06 ENCOUNTER — Telehealth: Payer: Self-pay

## 2018-09-06 NOTE — Telephone Encounter (Signed)
Unable to lmom for prescreen no voicemail availability

## 2018-09-07 ENCOUNTER — Other Ambulatory Visit: Payer: Self-pay

## 2018-09-07 ENCOUNTER — Ambulatory Visit (INDEPENDENT_AMBULATORY_CARE_PROVIDER_SITE_OTHER): Payer: BLUE CROSS/BLUE SHIELD

## 2018-09-07 DIAGNOSIS — Z952 Presence of prosthetic heart valve: Secondary | ICD-10-CM | POA: Diagnosis not present

## 2018-09-07 DIAGNOSIS — Z7901 Long term (current) use of anticoagulants: Secondary | ICD-10-CM

## 2018-09-07 LAB — POCT INR: INR: 1.7 — AB (ref 2.0–3.0)

## 2018-09-07 NOTE — Patient Instructions (Signed)
Description   Spoke with pt and instructed pt to take 2 tablets today, then resume same dosage 1.5 tablets daily except 1 tablet on Saturdays, Tuesdays, and Thursdays. Recheck INR in 3 weeks. Call with any changes [phone]. Keep the same diet you have been eating.

## 2018-09-08 ENCOUNTER — Other Ambulatory Visit: Payer: Self-pay | Admitting: Cardiovascular Disease

## 2018-09-08 DIAGNOSIS — Z952 Presence of prosthetic heart valve: Secondary | ICD-10-CM

## 2018-09-08 DIAGNOSIS — I428 Other cardiomyopathies: Secondary | ICD-10-CM

## 2018-09-24 ENCOUNTER — Telehealth: Payer: Self-pay

## 2018-09-24 NOTE — Telephone Encounter (Signed)

## 2018-10-01 ENCOUNTER — Other Ambulatory Visit: Payer: Self-pay

## 2018-10-01 ENCOUNTER — Ambulatory Visit (INDEPENDENT_AMBULATORY_CARE_PROVIDER_SITE_OTHER): Payer: BC Managed Care – PPO | Admitting: Pharmacist

## 2018-10-01 DIAGNOSIS — Z952 Presence of prosthetic heart valve: Secondary | ICD-10-CM

## 2018-10-01 DIAGNOSIS — Z7901 Long term (current) use of anticoagulants: Secondary | ICD-10-CM | POA: Diagnosis not present

## 2018-10-01 LAB — POCT INR: INR: 1.4 — AB (ref 2.0–3.0)

## 2018-10-01 NOTE — Patient Instructions (Signed)
Take 2 tablets today and 1.5 tablets tomorrow, then start taking 1.5 tablets daily except 1 tablet on Tuesdays and Saturdays. Recheck INR in 2 weeks. Call with any changes [phone]. Keep the same diet you have been eating.

## 2018-10-09 ENCOUNTER — Telehealth: Payer: Self-pay

## 2018-10-09 NOTE — Telephone Encounter (Signed)

## 2018-10-16 ENCOUNTER — Other Ambulatory Visit: Payer: Self-pay

## 2018-10-16 ENCOUNTER — Ambulatory Visit (INDEPENDENT_AMBULATORY_CARE_PROVIDER_SITE_OTHER): Payer: BC Managed Care – PPO | Admitting: Pharmacist

## 2018-10-16 DIAGNOSIS — Z7901 Long term (current) use of anticoagulants: Secondary | ICD-10-CM | POA: Diagnosis not present

## 2018-10-16 DIAGNOSIS — Z952 Presence of prosthetic heart valve: Secondary | ICD-10-CM

## 2018-10-16 LAB — POCT INR: INR: 1.8 — AB (ref 2.0–3.0)

## 2018-10-16 NOTE — Patient Instructions (Signed)
Take 1.5 today then continue taking 1.5 tablets daily except 1 tablet on Tuesdays and Saturdays. Recheck INR in 2 weeks. Call with any changes [phone]. Keep the same diet you have been eating.

## 2018-10-29 ENCOUNTER — Telehealth: Payer: Self-pay

## 2018-10-29 NOTE — Telephone Encounter (Signed)

## 2018-10-29 NOTE — Telephone Encounter (Signed)
lmom for prescreen  

## 2018-10-31 ENCOUNTER — Ambulatory Visit (INDEPENDENT_AMBULATORY_CARE_PROVIDER_SITE_OTHER): Payer: BC Managed Care – PPO | Admitting: *Deleted

## 2018-10-31 ENCOUNTER — Other Ambulatory Visit: Payer: Self-pay

## 2018-10-31 DIAGNOSIS — Z7901 Long term (current) use of anticoagulants: Secondary | ICD-10-CM | POA: Diagnosis not present

## 2018-10-31 DIAGNOSIS — Z952 Presence of prosthetic heart valve: Secondary | ICD-10-CM | POA: Diagnosis not present

## 2018-10-31 LAB — POCT INR: INR: 1.8 — AB (ref 2.0–3.0)

## 2018-10-31 NOTE — Patient Instructions (Signed)
Description   Take 2 tablets today then start taking 1.5 tablets daily except 1 tablet on Tuesdays. Recheck INR in 2 weeks. Call with any changes [phone]. Keep the same diet you have been eating.

## 2018-11-14 ENCOUNTER — Other Ambulatory Visit: Payer: Self-pay

## 2018-11-14 ENCOUNTER — Ambulatory Visit (INDEPENDENT_AMBULATORY_CARE_PROVIDER_SITE_OTHER): Payer: BC Managed Care – PPO | Admitting: Pharmacist

## 2018-11-14 DIAGNOSIS — Z7901 Long term (current) use of anticoagulants: Secondary | ICD-10-CM | POA: Diagnosis not present

## 2018-11-14 DIAGNOSIS — Z952 Presence of prosthetic heart valve: Secondary | ICD-10-CM | POA: Diagnosis not present

## 2018-11-14 LAB — POCT INR: INR: 2.2 (ref 2.0–3.0)

## 2018-11-14 NOTE — Patient Instructions (Signed)
Continue taking 1.5 tablets daily except 1 tablet on Tuesdays. Recheck INR in 4 weeks. Call with any changes [phone]. Keep the same diet you have been eating.

## 2018-12-12 ENCOUNTER — Other Ambulatory Visit: Payer: Self-pay

## 2018-12-12 ENCOUNTER — Ambulatory Visit (INDEPENDENT_AMBULATORY_CARE_PROVIDER_SITE_OTHER): Payer: BC Managed Care – PPO

## 2018-12-12 DIAGNOSIS — Z7901 Long term (current) use of anticoagulants: Secondary | ICD-10-CM

## 2018-12-12 DIAGNOSIS — Z952 Presence of prosthetic heart valve: Secondary | ICD-10-CM

## 2018-12-12 LAB — POCT INR: INR: 2.2 (ref 2.0–3.0)

## 2018-12-12 NOTE — Patient Instructions (Signed)
Description   Continue taking 1.5 tablets daily except 1 tablet on Tuesdays. Recheck INR in 5 weeks. Call with any changes [phone]. Keep the same diet you have been eating.

## 2018-12-31 DIAGNOSIS — Z13220 Encounter for screening for lipoid disorders: Secondary | ICD-10-CM | POA: Diagnosis not present

## 2018-12-31 DIAGNOSIS — G4700 Insomnia, unspecified: Secondary | ICD-10-CM | POA: Diagnosis not present

## 2018-12-31 DIAGNOSIS — Z125 Encounter for screening for malignant neoplasm of prostate: Secondary | ICD-10-CM | POA: Diagnosis not present

## 2018-12-31 DIAGNOSIS — E119 Type 2 diabetes mellitus without complications: Secondary | ICD-10-CM | POA: Diagnosis not present

## 2018-12-31 DIAGNOSIS — Z23 Encounter for immunization: Secondary | ICD-10-CM | POA: Diagnosis not present

## 2018-12-31 DIAGNOSIS — Z954 Presence of other heart-valve replacement: Secondary | ICD-10-CM | POA: Diagnosis not present

## 2018-12-31 DIAGNOSIS — Z0001 Encounter for general adult medical examination with abnormal findings: Secondary | ICD-10-CM | POA: Diagnosis not present

## 2019-01-16 ENCOUNTER — Other Ambulatory Visit: Payer: Self-pay

## 2019-01-16 ENCOUNTER — Ambulatory Visit (INDEPENDENT_AMBULATORY_CARE_PROVIDER_SITE_OTHER): Payer: BC Managed Care – PPO | Admitting: *Deleted

## 2019-01-16 DIAGNOSIS — Z952 Presence of prosthetic heart valve: Secondary | ICD-10-CM | POA: Diagnosis not present

## 2019-01-16 DIAGNOSIS — Z7901 Long term (current) use of anticoagulants: Secondary | ICD-10-CM | POA: Diagnosis not present

## 2019-01-16 LAB — POCT INR: INR: 2.2 (ref 2.0–3.0)

## 2019-01-16 MED ORDER — WARFARIN SODIUM 7.5 MG PO TABS: ORAL_TABLET | ORAL | 2 refills | Status: DC

## 2019-01-16 NOTE — Patient Instructions (Signed)
Description   Continue taking 1.5 tablets daily except 1 tablet on Tuesdays. Recheck INR in 6 weeks. Call with any changes [phone]. Keep the same diet you have been eating.     

## 2019-01-28 DIAGNOSIS — E119 Type 2 diabetes mellitus without complications: Secondary | ICD-10-CM | POA: Diagnosis not present

## 2019-01-28 DIAGNOSIS — Z79899 Other long term (current) drug therapy: Secondary | ICD-10-CM | POA: Diagnosis not present

## 2019-01-28 DIAGNOSIS — E781 Pure hyperglyceridemia: Secondary | ICD-10-CM | POA: Diagnosis not present

## 2019-02-27 ENCOUNTER — Other Ambulatory Visit: Payer: Self-pay

## 2019-02-27 ENCOUNTER — Ambulatory Visit (INDEPENDENT_AMBULATORY_CARE_PROVIDER_SITE_OTHER): Payer: BC Managed Care – PPO | Admitting: Pharmacist

## 2019-02-27 DIAGNOSIS — Z7901 Long term (current) use of anticoagulants: Secondary | ICD-10-CM

## 2019-02-27 DIAGNOSIS — Z952 Presence of prosthetic heart valve: Secondary | ICD-10-CM | POA: Diagnosis not present

## 2019-02-27 LAB — POCT INR: INR: 2.5 (ref 2.0–3.0)

## 2019-02-27 NOTE — Patient Instructions (Signed)
Description   Continue taking 1.5 tablets daily except 1 tablet on Tuesdays. Recheck INR in 6 weeks. Call with any changes [phone]. Keep the same diet you have been eating.

## 2019-04-02 DIAGNOSIS — E119 Type 2 diabetes mellitus without complications: Secondary | ICD-10-CM | POA: Diagnosis not present

## 2019-04-10 ENCOUNTER — Other Ambulatory Visit: Payer: Self-pay

## 2019-04-10 ENCOUNTER — Ambulatory Visit (INDEPENDENT_AMBULATORY_CARE_PROVIDER_SITE_OTHER): Payer: BC Managed Care – PPO

## 2019-04-10 DIAGNOSIS — Z7901 Long term (current) use of anticoagulants: Secondary | ICD-10-CM | POA: Diagnosis not present

## 2019-04-10 DIAGNOSIS — Z952 Presence of prosthetic heart valve: Secondary | ICD-10-CM | POA: Diagnosis not present

## 2019-04-10 LAB — POCT INR: INR: 1.9 — AB (ref 2.0–3.0)

## 2019-04-10 NOTE — Patient Instructions (Signed)
Description   Take 2 tablets today, then resume same dosage 1.5 tablets daily except 1 tablet on Tuesdays. Recheck INR in 6 weeks. Call with any changes [phone]. Keep the same diet you have been eating.

## 2019-04-15 ENCOUNTER — Other Ambulatory Visit: Payer: Self-pay | Admitting: Cardiovascular Disease

## 2019-05-01 DIAGNOSIS — Z79899 Other long term (current) drug therapy: Secondary | ICD-10-CM | POA: Diagnosis not present

## 2019-05-10 ENCOUNTER — Telehealth: Payer: Self-pay

## 2019-05-10 NOTE — Telephone Encounter (Signed)
Called patient to arrange one year echo and office visit. The patient does not wish to schedule echo at this time in lieu of the pandemic, but he does want to schedule visit with Dr. [name]. Arranged visit 5/3. He understands to send a message if he'd like to schedule echo. He was grateful for assistance.

## 2019-05-22 ENCOUNTER — Ambulatory Visit (INDEPENDENT_AMBULATORY_CARE_PROVIDER_SITE_OTHER): Payer: BC Managed Care – PPO | Admitting: *Deleted

## 2019-05-22 ENCOUNTER — Other Ambulatory Visit: Payer: Self-pay

## 2019-05-22 DIAGNOSIS — Z5181 Encounter for therapeutic drug level monitoring: Secondary | ICD-10-CM

## 2019-05-22 DIAGNOSIS — Z7901 Long term (current) use of anticoagulants: Secondary | ICD-10-CM | POA: Diagnosis not present

## 2019-05-22 DIAGNOSIS — Z952 Presence of prosthetic heart valve: Secondary | ICD-10-CM

## 2019-05-22 LAB — POCT INR: INR: 2.1 (ref 2.0–3.0)

## 2019-05-22 NOTE — Patient Instructions (Signed)
Description   Continue same dosage 1.5 tablets daily except 1 tablet on Tuesdays. Recheck INR in 6 weeks. Call with any changes [phone]. Keep the same diet you have been eating.      

## 2019-07-03 ENCOUNTER — Other Ambulatory Visit: Payer: Self-pay

## 2019-07-03 ENCOUNTER — Ambulatory Visit (INDEPENDENT_AMBULATORY_CARE_PROVIDER_SITE_OTHER): Payer: 59 | Admitting: *Deleted

## 2019-07-03 DIAGNOSIS — Z7901 Long term (current) use of anticoagulants: Secondary | ICD-10-CM | POA: Diagnosis not present

## 2019-07-03 DIAGNOSIS — Z952 Presence of prosthetic heart valve: Secondary | ICD-10-CM

## 2019-07-03 LAB — POCT INR: INR: 2 (ref 2.0–3.0)

## 2019-07-03 NOTE — Patient Instructions (Signed)
Description   Continue same dosage 1.5 tablets daily except 1 tablet on Tuesdays. Recheck INR in 6 weeks. Call with any changes [phone]. Keep the same diet you have been eating.

## 2019-07-17 ENCOUNTER — Telehealth (HOSPITAL_COMMUNITY): Payer: Self-pay | Admitting: Cardiovascular Disease

## 2019-07-17 ENCOUNTER — Other Ambulatory Visit: Payer: Self-pay | Admitting: Cardiovascular Disease

## 2019-07-17 DIAGNOSIS — Z952 Presence of prosthetic heart valve: Secondary | ICD-10-CM

## 2019-07-17 NOTE — Telephone Encounter (Signed)
I called patient to schedule Echocardiogram and he is hesitant to schedule due to he had one and someone told him he was good for 2 years. Please follow up with patient and if not needed ca we please cancel order.

## 2019-07-18 NOTE — Telephone Encounter (Signed)
The patient understands his 2 year echo is now due. He remembers being called a couple months ago to arrange echo and visit and not scheduling echo because of the pandemic. He is now comfortable to schedule. Scheduled echo 4/30 prior to visit 5/3. He was grateful for call and agrees with treatment plan.

## 2019-07-19 ENCOUNTER — Ambulatory Visit (INDEPENDENT_AMBULATORY_CARE_PROVIDER_SITE_OTHER): Payer: 59 | Admitting: Pharmacist

## 2019-07-19 ENCOUNTER — Other Ambulatory Visit: Payer: Self-pay

## 2019-07-19 DIAGNOSIS — Z952 Presence of prosthetic heart valve: Secondary | ICD-10-CM

## 2019-07-19 DIAGNOSIS — Z7901 Long term (current) use of anticoagulants: Secondary | ICD-10-CM | POA: Diagnosis not present

## 2019-07-19 LAB — POCT INR: INR: 2 (ref 2.0–3.0)

## 2019-07-19 NOTE — Patient Instructions (Signed)
Continue same dosage 1.5 tablets daily except 1 tablet on Tuesdays. Recheck INR in 6 weeks. Call with any changes [phone]. Keep the same diet you have been eating.

## 2019-07-28 ENCOUNTER — Other Ambulatory Visit: Payer: Self-pay | Admitting: Cardiovascular Disease

## 2019-08-09 ENCOUNTER — Other Ambulatory Visit: Payer: Self-pay

## 2019-08-09 ENCOUNTER — Ambulatory Visit (HOSPITAL_COMMUNITY): Payer: 59 | Attending: Internal Medicine

## 2019-08-09 DIAGNOSIS — R0602 Shortness of breath: Secondary | ICD-10-CM | POA: Insufficient documentation

## 2019-08-09 DIAGNOSIS — I429 Cardiomyopathy, unspecified: Secondary | ICD-10-CM | POA: Insufficient documentation

## 2019-08-09 DIAGNOSIS — Z952 Presence of prosthetic heart valve: Secondary | ICD-10-CM | POA: Insufficient documentation

## 2019-08-09 MED ORDER — PERFLUTREN LIPID MICROSPHERE
1.0000 mL | INTRAVENOUS | Status: AC | PRN
  Administered 2019-08-09: 1 mL via INTRAVENOUS

## 2019-08-12 ENCOUNTER — Ambulatory Visit (INDEPENDENT_AMBULATORY_CARE_PROVIDER_SITE_OTHER): Payer: 59 | Admitting: Cardiovascular Disease

## 2019-08-12 ENCOUNTER — Encounter: Payer: Self-pay | Admitting: Cardiovascular Disease

## 2019-08-12 ENCOUNTER — Other Ambulatory Visit: Payer: Self-pay

## 2019-08-12 VITALS — BP 122/78 | HR 58 | Ht 70.0 in | Wt 201.0 lb

## 2019-08-12 DIAGNOSIS — I712 Thoracic aortic aneurysm, without rupture, unspecified: Secondary | ICD-10-CM

## 2019-08-12 DIAGNOSIS — I428 Other cardiomyopathies: Secondary | ICD-10-CM | POA: Diagnosis not present

## 2019-08-12 DIAGNOSIS — Z952 Presence of prosthetic heart valve: Secondary | ICD-10-CM

## 2019-08-12 MED ORDER — LISINOPRIL 5 MG PO TABS: 5.0000 mg | ORAL_TABLET | Freq: Every day | ORAL | 3 refills | Status: DC

## 2019-08-12 MED ORDER — CARVEDILOL 12.5 MG PO TABS: 12.5000 mg | ORAL_TABLET | Freq: Two times a day (BID) | ORAL | 3 refills | Status: DC

## 2019-08-12 NOTE — Progress Notes (Signed)
Cardiology Office Note:    Date:  08/12/2019   ID:  Jeffrey [name], DOB December 12, 1965, MRN [id number]  PCP:  Marden Noble, MD  Cardiologist:  [name], MD  Electrophysiologist:  None   Referring MD: Marden Noble, MD   Chief Complaint  Patient presents with  . Aortic Insuffiency    History of Present Illness:    Jeffrey [name] is a 54 y.o. male with a hx of ascending thoracic aortic aneurysm and severe aortic valve insufficiency who underwent Bentall surgery with a Saint Jude 27 mm mechanical valve and a 30 mm Dacron aortic graft in 2015. A recent echo study showed normal LV function and normal function of his mechanical aortic valve.   The patient is here alone today.  He has been doing very well.  He denies any symptoms of chest pain, shortness of breath, or heart palpitations.  He denies leg swelling, orthopnea, PND, lightheadedness, or syncope.  He is compliant with his medicines and denies any bleeding problems.  Past Medical History:  Diagnosis Date  . Aortic insufficiency    a. 02/2014 - admx with sx severe AI and assoc ascending aortic aneurysm (6.8 cm) >> s/p Bentall with mechancial AVR (Dr. Donata Clay)  . Hx of cardiac catheterization    a. LHC (11/15): EF 55%, marked ascending aortic dilatation with severe AI on aortic root angiography, normal coronary arteries  . Hx of echocardiogram    a. Echo 11/15: EF 55%, severe AI, marked dilation of ascending aorta; b. Echo 12/15: EF 30-35%, large effusion; c. Echo 2/16: EF 40-45%, no RWMA, mechanical AVR ok, trivial effusion post to heart   . NICM (nonischemic cardiomyopathy) (HCC)    a. TEE (11/15): Mild LVH, EF 45-50%, diffuse HK, prominent apical trabeculations, severe AI, severely dilated ascending aorta (6.9 cm), mild MR;  b. EF 30-35% post AVR >> c. EF 40-45% by echo 05/2014  . Pericardial effusion    large pericardial effusion post Bentall procedure >> s/p pericardial window  . Thoracic ascending aortic aneurysm  (HCC)    Echo 11/15: severe AI, severely dilated ascending aorta (6.5 cm);  s/p Bentall 02/2014 with aortic root replacement using a mechanical valve-conduit and reimplantation of coronary arteries (St. Jude 27 mm valve), replacement of ascending aorta to the proximal arch with a 30 mm Dacron graft    Past Surgical History:  Procedure Laterality Date  . BENTALL PROCEDURE N/A 02/24/2014   Procedure: BENTALL PROCEDURE;  Surgeon: Kerin Perna, MD;  Location: [facility] OR;  Service: Open Heart Surgery;  Laterality: N/A;  . INTRAOPERATIVE TRANSESOPHAGEAL ECHOCARDIOGRAM N/A 02/24/2014   Procedure: INTRAOPERATIVE TRANSESOPHAGEAL ECHOCARDIOGRAM;  Surgeon: Kerin Perna, MD;  Location: [facility] OR;  Service: Open Heart Surgery;  Laterality: N/A;  . LEFT AND RIGHT HEART CATHETERIZATION WITH CORONARY ANGIOGRAM N/A 02/20/2014   Procedure: LEFT AND RIGHT HEART CATHETERIZATION WITH CORONARY ANGIOGRAM;  Surgeon: Micheline Chapman, MD;  Location: [facility] CATH LAB;  Service: Cardiovascular;  Laterality: N/A;  . SUBXYPHOID PERICARDIAL WINDOW N/A 04/02/2014   Procedure: SUBXYPHOID PERICARDIAL WINDOW;  Surgeon: Kerin Perna, MD;  Location: [facility] OR;  Service: Thoracic;  Laterality: N/A;  . TEE WITHOUT CARDIOVERSION N/A 02/20/2014   Procedure: TRANSESOPHAGEAL ECHOCARDIOGRAM (TEE);  Surgeon: Lars Masson, MD;  Location: [facility] ENDOSCOPY;  Service: Cardiovascular;  Laterality: N/A;  . TEE WITHOUT CARDIOVERSION N/A 04/02/2014   Procedure: TRANSESOPHAGEAL ECHOCARDIOGRAM (TEE);  Surgeon: Kerin Perna, MD;  Location: [facility] OR;  Service: Thoracic;  Laterality: N/A;    Current  Medications: Current Meds  Medication Sig  . acetaminophen (TYLENOL) 500 MG tablet Take 500 mg by mouth every 6 (six) hours as needed for mild pain or fever.   Marland Kitchen amoxicillin (AMOXIL) 500 MG tablet Take all 4 tablets one hour prior to procedure  . aspirin 81 MG EC tablet Take 1 tablet (81 mg total) by mouth daily.  . carvedilol (COREG) 12.5 MG tablet Take 1  tablet (12.5 mg total) by mouth 2 (two) times daily with a meal. Please schedule follow appt for further refills. [phone] 1st attempt  . lisinopril (ZESTRIL) 5 MG tablet TAKE 1 TABLET(5 MG) BY MOUTH DAILY  . warfarin (COUMADIN) 7.5 MG tablet Take 1.5 tablets daily except 1 tablet on Tuesdays or as DIRECTED BY THE COUMADIN CLINIC.  Marland Kitchen zolpidem (AMBIEN) 10 MG tablet Take 5 mg by mouth daily.     Allergies:   Patient has no known allergies.   Social History   Socioeconomic History  . Marital status: Married    Spouse name: Not on file  . Number of children: Not on file  . Years of education: Not on file  . Highest education level: Not on file  Occupational History  . Not on file  Tobacco Use  . Smoking status: Never Smoker  . Smokeless tobacco: Never Used  Substance and Sexual Activity  . Alcohol use: No  . Drug use: No  . Sexual activity: Not on file  Other Topics Concern  . Not on file  Social History Narrative  . Not on file   Social Determinants of Health   Financial Resource Strain:   . Difficulty of Paying Living Expenses:   Food Insecurity:   . Worried About Charity fundraiser in the Last Year:   . Arboriculturist in the Last Year:   Transportation Needs:   . Film/video editor (Medical):   Marland Kitchen Lack of Transportation (Non-Medical):   Physical Activity:   . Days of Exercise per Week:   . Minutes of Exercise per Session:   Stress:   . Feeling of Stress :   Social Connections:   . Frequency of Communication with Friends and Family:   . Frequency of Social Gatherings with Friends and Family:   . Attends Religious Services:   . Active Member of Clubs or Organizations:   . Attends Archivist Meetings:   Marland Kitchen Marital Status:      Family History: The patient's family history includes Cancer in his maternal grandmother. There is no history of Heart attack or Stroke.  ROS:   Please see the history of present illness.    All other systems reviewed and  are negative.  EKGs/Labs/Other Studies Reviewed:    The following studies were reviewed today: Echo 08/09/2019: IMPRESSIONS    1. Left ventricular ejection fraction, by estimation, is 55 to 60%. The  left ventricle has normal function. The left ventricle demonstrates  regional wall motion abnormalities (see scoring diagram/findings for  description). Left ventricular diastolic  parameters are indeterminate.  2. Right ventricular systolic function is normal. The right ventricular  size is normal.  3. The mitral valve is abnormal. No evidence of mitral valve  regurgitation.  4. St. Jude 27 mm mechanical valve in the aortic position (Procedure date  11/16/ 2015). Peak and mean gradients through the valve are 13 and 6 mm Hg  respectively. The aortic valve has been repaired/replaced. Aortic valve  regurgitation is trivial.   Comparison(s): 06/14/17 EF 55-60%. AV  mean PG, peak PG.  EKG:  EKG is ordered today.  The ekg ordered today demonstrates sinus brady 58 bpm, LVH with repolarization abnormality  Recent Labs: No results found for requested labs within last 8760 hours.  Recent Lipid Panel No results found for: CHOL, TRIG, HDL, CHOLHDL, VLDL, LDLCALC, LDLDIRECT  Physical Exam:    VS:  BP 122/78   Pulse (!) 58   Ht 5\' 10"  (1.778 m)   Wt 201 lb (91.2 kg)   SpO2 99%   BMI 28.84 kg/m     Wt Readings from Last 3 Encounters:  08/12/19 201 lb (91.2 kg)  08/02/18 192 lb (87.1 kg)  06/19/17 223 lb 1.9 oz (101.2 kg)     GEN:  Well nourished, well developed in no acute distress HEENT: Normal NECK: No JVD; No carotid bruits LYMPHATICS: No lymphadenopathy CARDIAC: RRR, no murmurs, rubs, gallops, prominent Crisp mechanical A2  RESPIRATORY:  Clear to auscultation without rales, wheezing or rhonchi  ABDOMEN: Soft, non-tender, non-distended MUSCULOSKELETAL:  No edema; No deformity  SKIN: Warm and dry NEUROLOGIC:  Alert and oriented x 3 PSYCHIATRIC:  Normal affect    ASSESSMENT:    1. Thoracic aortic aneurysm without rupture (HCC)   2. S/P AVR (aortic valve replacement) and aortoplasty    PLAN:    In order of problems listed above:  1. Most recent echocardiogram reviewed and shows normal function of his mechanical aortic bioprosthesis.  Ascending aortic measurements are stable.  He will continue current medications. 2. The patient appears to be doing very well.  He will remain on warfarin and low-dose aspirin.  We discussed the pros and cons of staying on aspirin with a mechanical aortic valve.  He is at low bleeding risk and has not had any history of bleeding related problems.  We will continue same medical program without change.  LV function has normalized on low doses of carvedilol and lisinopril which she seems to be tolerating fairly well.   Medication Adjustments/Labs and Tests Ordered: Current medicines are reviewed at length with the patient today.  Concerns regarding medicines are outlined above.  No orders of the defined types were placed in this encounter.  No orders of the defined types were placed in this encounter.   There are no Patient Instructions on file for this visit.   Signed, 08/19/17, MD  08/12/2019 9:34 AM    [facility]

## 2019-08-12 NOTE — Patient Instructions (Signed)

## 2019-08-13 ENCOUNTER — Other Ambulatory Visit: Payer: Self-pay | Admitting: Cardiovascular Disease

## 2019-08-13 DIAGNOSIS — Z952 Presence of prosthetic heart valve: Secondary | ICD-10-CM

## 2019-08-30 ENCOUNTER — Ambulatory Visit (INDEPENDENT_AMBULATORY_CARE_PROVIDER_SITE_OTHER): Payer: 59 | Admitting: *Deleted

## 2019-08-30 ENCOUNTER — Other Ambulatory Visit: Payer: Self-pay

## 2019-08-30 DIAGNOSIS — Z7901 Long term (current) use of anticoagulants: Secondary | ICD-10-CM | POA: Diagnosis not present

## 2019-08-30 DIAGNOSIS — Z5181 Encounter for therapeutic drug level monitoring: Secondary | ICD-10-CM | POA: Diagnosis not present

## 2019-08-30 DIAGNOSIS — Z952 Presence of prosthetic heart valve: Secondary | ICD-10-CM

## 2019-08-30 LAB — POCT INR: INR: 1.8 — AB (ref 2.0–3.0)

## 2019-08-30 NOTE — Patient Instructions (Addendum)
Description   Take 2 tablets today and then start taking 1.5 tablets daily. Recheck INR in 3 weeks. Call with any changes [phone]. Keep the same diet you have been eating.

## 2019-09-20 ENCOUNTER — Ambulatory Visit (INDEPENDENT_AMBULATORY_CARE_PROVIDER_SITE_OTHER): Payer: 59 | Admitting: *Deleted

## 2019-09-20 ENCOUNTER — Other Ambulatory Visit: Payer: Self-pay

## 2019-09-20 DIAGNOSIS — Z7901 Long term (current) use of anticoagulants: Secondary | ICD-10-CM | POA: Diagnosis not present

## 2019-09-20 DIAGNOSIS — Z5181 Encounter for therapeutic drug level monitoring: Secondary | ICD-10-CM | POA: Diagnosis not present

## 2019-09-20 DIAGNOSIS — Z952 Presence of prosthetic heart valve: Secondary | ICD-10-CM | POA: Diagnosis not present

## 2019-09-20 LAB — POCT INR: INR: 2.6 (ref 2.0–3.0)

## 2019-09-20 NOTE — Patient Instructions (Signed)
Description   Continue taking 1.5 tablets daily. Recheck INR in 4 weeks. Call with any changes [phone]. Keep the same diet you have been eating.

## 2019-10-18 ENCOUNTER — Other Ambulatory Visit: Payer: Self-pay

## 2019-10-18 ENCOUNTER — Ambulatory Visit (INDEPENDENT_AMBULATORY_CARE_PROVIDER_SITE_OTHER): Payer: 59 | Admitting: *Deleted

## 2019-10-18 DIAGNOSIS — Z5181 Encounter for therapeutic drug level monitoring: Secondary | ICD-10-CM | POA: Diagnosis not present

## 2019-10-18 DIAGNOSIS — Z7901 Long term (current) use of anticoagulants: Secondary | ICD-10-CM | POA: Diagnosis not present

## 2019-10-18 DIAGNOSIS — Z952 Presence of prosthetic heart valve: Secondary | ICD-10-CM

## 2019-10-18 LAB — POCT INR: INR: 2.3 (ref 2.0–3.0)

## 2019-10-18 MED ORDER — WARFARIN SODIUM 7.5 MG PO TABS: ORAL_TABLET | ORAL | 2 refills | Status: DC

## 2019-10-18 NOTE — Patient Instructions (Signed)
Description   Continue taking 1.5 tablets daily. Recheck INR in 5 weeks. Call with any changes [phone]. Keep the same diet you have been eating.

## 2019-11-07 ENCOUNTER — Telehealth: Payer: Self-pay | Admitting: *Deleted

## 2019-11-07 NOTE — Telephone Encounter (Signed)
Pt walked in clearance request from [facility]; Dorcas Carrow, DDS and Dr. Leonie Man, DDS. I tried to call their office to confirm the dental procedure to be done however recording states not available and call was disconnected. I will attempt to call again tomorrow.

## 2019-11-08 ENCOUNTER — Telehealth: Payer: Self-pay | Admitting: *Deleted

## 2019-11-08 NOTE — Telephone Encounter (Signed)
I s/w dental office today and obtained clarification of procedure.      [facility] Pre-operative Risk Assessment    HEARTCARE STAFF: - Please ensure there is not already an duplicate clearance open for this procedure. - Under Visit Info/Reason for Call, type in Other and utilize the format Clearance MM/DD/YY or Clearance TBD. Do not use dashes or single digits. - If request is for dental extraction, please clarify the # of teeth to be extracted.  Request for surgical clearance:  1. What type of surgery is being performed? 1 TOOTH TO BE EXTRACTED ALONG WITH 1 BONE GRAFT TO BE DONE   2. When is this surgery scheduled? 11/14/19   3. What type of clearance is required (medical clearance vs. Pharmacy clearance to hold med vs. Both)? BOTH  4. Are there any medications that need to be held prior to surgery and how long? ASA AND WARFARIN; DR. Hardie Shackleton IS REQUESTING INR RESULTS TO BE FAXED OVER TO THEIR OFFICE ; PT HAS APPT ON 11/12/19 FOR INR TO BE CHECKED  5. Practice name and name of physician performing surgery? [facility]; DR. Raj Janus, DDS   6. What is the office phone number? [phone]   7.   What is the office fax number? [phone]  8.   Anesthesia type (None, local, MAC, general) ? LOCAL   Julaine Hua 11/08/2019, 8:45 AM  _________________________________________________________________   (provider comments below)

## 2019-11-08 NOTE — Telephone Encounter (Signed)
Pt calling the office stating that he is not going to have the dental procedure done and wants to reschedule his coumadin appointment to the original time 11/22/2019. Rescheduled the appointment per pt request. Informed pt to let us know when he decides to have dental procedure so we can arrange INR check accordingly.

## 2019-11-08 NOTE — Telephone Encounter (Signed)
° °  Primary Cardiologist: [name], MD  Chart reviewed as part of pre-operative protocol coverage. Simple dental extractions are considered low risk procedures per guidelines and generally do not require any specific cardiac clearance. It is also generally accepted that for simple extractions and dental cleanings, there is no need to interrupt blood thinner therapy.   SBE prophylaxis is required for the patient.  Patient has prescription for amoxicillin to be taken 60 minutes prior to procedure.  I will route this recommendation to the requesting party via Epic fax function and remove from pre-op pool.  Please call with questions.  Ronney Asters, NP 11/08/2019, 1:12 PM

## 2019-11-22 ENCOUNTER — Ambulatory Visit (INDEPENDENT_AMBULATORY_CARE_PROVIDER_SITE_OTHER): Payer: 59 | Admitting: Pharmacist

## 2019-11-22 ENCOUNTER — Other Ambulatory Visit: Payer: Self-pay

## 2019-11-22 DIAGNOSIS — Z7901 Long term (current) use of anticoagulants: Secondary | ICD-10-CM | POA: Diagnosis not present

## 2019-11-22 DIAGNOSIS — Z952 Presence of prosthetic heart valve: Secondary | ICD-10-CM

## 2019-11-22 LAB — POCT INR: INR: 3.1 — AB (ref 2.0–3.0)

## 2019-11-22 NOTE — Patient Instructions (Signed)
Eat some greens tonight and continue taking 1.5 tablets daily. Recheck INR in 5 weeks. Call with any changes [phone]. Keep the same diet you have been eating.

## 2019-12-27 ENCOUNTER — Other Ambulatory Visit: Payer: Self-pay

## 2019-12-27 ENCOUNTER — Ambulatory Visit (INDEPENDENT_AMBULATORY_CARE_PROVIDER_SITE_OTHER): Payer: 59 | Admitting: *Deleted

## 2019-12-27 DIAGNOSIS — Z5181 Encounter for therapeutic drug level monitoring: Secondary | ICD-10-CM | POA: Diagnosis not present

## 2019-12-27 DIAGNOSIS — Z952 Presence of prosthetic heart valve: Secondary | ICD-10-CM

## 2019-12-27 DIAGNOSIS — Z7901 Long term (current) use of anticoagulants: Secondary | ICD-10-CM

## 2019-12-27 LAB — POCT INR: INR: 3.1 — AB (ref 2.0–3.0)

## 2019-12-27 NOTE — Patient Instructions (Signed)
Description   Start taking 1.5 tables daily except for 1 tablet on Fridays. Recheck INR in 3 weeks. Call with any changes [phone]. Keep the same diet you have been eating.

## 2020-01-10 ENCOUNTER — Other Ambulatory Visit: Payer: Self-pay | Admitting: Cardiovascular Disease

## 2020-01-14 DIAGNOSIS — Z954 Presence of other heart-valve replacement: Secondary | ICD-10-CM | POA: Diagnosis not present

## 2020-01-14 DIAGNOSIS — Z125 Encounter for screening for malignant neoplasm of prostate: Secondary | ICD-10-CM | POA: Diagnosis not present

## 2020-01-14 DIAGNOSIS — L02612 Cutaneous abscess of left foot: Secondary | ICD-10-CM | POA: Diagnosis not present

## 2020-01-14 DIAGNOSIS — E781 Pure hyperglyceridemia: Secondary | ICD-10-CM | POA: Diagnosis not present

## 2020-01-14 DIAGNOSIS — Z79899 Other long term (current) drug therapy: Secondary | ICD-10-CM | POA: Diagnosis not present

## 2020-01-14 DIAGNOSIS — E119 Type 2 diabetes mellitus without complications: Secondary | ICD-10-CM | POA: Diagnosis not present

## 2020-01-14 DIAGNOSIS — Z0000 Encounter for general adult medical examination without abnormal findings: Secondary | ICD-10-CM | POA: Diagnosis not present

## 2020-01-17 ENCOUNTER — Ambulatory Visit (INDEPENDENT_AMBULATORY_CARE_PROVIDER_SITE_OTHER): Payer: BC Managed Care – PPO | Admitting: Pharmacist

## 2020-01-17 ENCOUNTER — Other Ambulatory Visit: Payer: Self-pay

## 2020-01-17 DIAGNOSIS — Z7901 Long term (current) use of anticoagulants: Secondary | ICD-10-CM

## 2020-01-17 DIAGNOSIS — Z952 Presence of prosthetic heart valve: Secondary | ICD-10-CM | POA: Diagnosis not present

## 2020-01-17 LAB — POCT INR: INR: 3.5 — AB (ref 2.0–3.0)

## 2020-01-17 NOTE — Patient Instructions (Signed)
Description   Hold dose today then continue taking 1.5 tables daily except for 1 tablet on Fridays. Recheck INR in 2 weeks. Call with any changes [phone]. Keep the same diet you have been eating.

## 2020-01-31 ENCOUNTER — Other Ambulatory Visit: Payer: Self-pay

## 2020-01-31 ENCOUNTER — Ambulatory Visit (INDEPENDENT_AMBULATORY_CARE_PROVIDER_SITE_OTHER): Payer: BC Managed Care – PPO | Admitting: Pharmacist

## 2020-01-31 DIAGNOSIS — Z952 Presence of prosthetic heart valve: Secondary | ICD-10-CM

## 2020-01-31 DIAGNOSIS — Z7901 Long term (current) use of anticoagulants: Secondary | ICD-10-CM

## 2020-01-31 LAB — POCT INR: INR: 2.3 (ref 2.0–3.0)

## 2020-01-31 NOTE — Patient Instructions (Signed)
Description   Continue taking 1.5 tables daily except for 1 tablet on Fridays. Recheck INR in 4 weeks. Call with any changes [phone]. Keep the same diet you have been eating.

## 2020-02-28 ENCOUNTER — Ambulatory Visit (INDEPENDENT_AMBULATORY_CARE_PROVIDER_SITE_OTHER): Payer: BC Managed Care – PPO

## 2020-02-28 ENCOUNTER — Other Ambulatory Visit: Payer: Self-pay

## 2020-02-28 DIAGNOSIS — Z952 Presence of prosthetic heart valve: Secondary | ICD-10-CM

## 2020-02-28 DIAGNOSIS — Z7901 Long term (current) use of anticoagulants: Secondary | ICD-10-CM

## 2020-02-28 LAB — POCT INR: INR: 2.3 (ref 2.0–3.0)

## 2020-02-28 NOTE — Patient Instructions (Signed)
Description   Continue on same dosage 1.5 tables daily except for 1 tablet on Fridays. Recheck INR in 6 weeks. Call with any changes [phone]. Keep the same diet you have been eating.

## 2020-03-18 DIAGNOSIS — Z954 Presence of other heart-valve replacement: Secondary | ICD-10-CM | POA: Diagnosis not present

## 2020-03-18 DIAGNOSIS — E1169 Type 2 diabetes mellitus with other specified complication: Secondary | ICD-10-CM | POA: Diagnosis not present

## 2020-03-18 DIAGNOSIS — Z7901 Long term (current) use of anticoagulants: Secondary | ICD-10-CM | POA: Diagnosis not present

## 2020-04-17 ENCOUNTER — Other Ambulatory Visit: Payer: Self-pay

## 2020-04-17 ENCOUNTER — Ambulatory Visit (INDEPENDENT_AMBULATORY_CARE_PROVIDER_SITE_OTHER): Payer: BC Managed Care – PPO | Admitting: *Deleted

## 2020-04-17 DIAGNOSIS — Z952 Presence of prosthetic heart valve: Secondary | ICD-10-CM | POA: Diagnosis not present

## 2020-04-17 DIAGNOSIS — Z7901 Long term (current) use of anticoagulants: Secondary | ICD-10-CM | POA: Diagnosis not present

## 2020-04-17 LAB — POCT INR: INR: 2 (ref 2.0–3.0)

## 2020-05-03 ENCOUNTER — Other Ambulatory Visit: Payer: Self-pay | Admitting: Cardiovascular Disease

## 2020-05-29 ENCOUNTER — Other Ambulatory Visit: Payer: Self-pay

## 2020-05-29 ENCOUNTER — Ambulatory Visit (INDEPENDENT_AMBULATORY_CARE_PROVIDER_SITE_OTHER): Payer: BC Managed Care – PPO

## 2020-05-29 DIAGNOSIS — Z7901 Long term (current) use of anticoagulants: Secondary | ICD-10-CM | POA: Diagnosis not present

## 2020-05-29 DIAGNOSIS — Z952 Presence of prosthetic heart valve: Secondary | ICD-10-CM

## 2020-05-29 LAB — POCT INR: INR: 2.1 (ref 2.0–3.0)

## 2020-05-29 NOTE — Patient Instructions (Signed)
Description   Continue taking Warfarin 1.5 tablets daily except for 1 tablet on Fridays. Recheck INR in 6 weeks. Call with any changes [phone]. Keep the same diet you have been eating.

## 2020-06-12 DIAGNOSIS — R040 Epistaxis: Secondary | ICD-10-CM | POA: Diagnosis not present

## 2020-06-12 DIAGNOSIS — Z5181 Encounter for therapeutic drug level monitoring: Secondary | ICD-10-CM | POA: Diagnosis not present

## 2020-07-08 DIAGNOSIS — E119 Type 2 diabetes mellitus without complications: Secondary | ICD-10-CM | POA: Diagnosis not present

## 2020-07-08 DIAGNOSIS — E785 Hyperlipidemia, unspecified: Secondary | ICD-10-CM | POA: Diagnosis not present

## 2020-07-10 ENCOUNTER — Other Ambulatory Visit: Payer: Self-pay

## 2020-07-10 ENCOUNTER — Ambulatory Visit (INDEPENDENT_AMBULATORY_CARE_PROVIDER_SITE_OTHER): Payer: BC Managed Care – PPO | Admitting: *Deleted

## 2020-07-10 DIAGNOSIS — Z952 Presence of prosthetic heart valve: Secondary | ICD-10-CM

## 2020-07-10 DIAGNOSIS — Z7901 Long term (current) use of anticoagulants: Secondary | ICD-10-CM

## 2020-07-10 LAB — POCT INR: INR: 2.5 (ref 2.0–3.0)

## 2020-07-10 NOTE — Patient Instructions (Signed)
Description   Continue taking Warfarin 1.5 tablets daily except for 1 tablet on Fridays. Recheck INR in 6 weeks. Call with any changes [phone]. Keep the same diet you have been eating.

## 2020-07-12 ENCOUNTER — Other Ambulatory Visit: Payer: Self-pay | Admitting: Cardiovascular Disease

## 2020-07-12 DIAGNOSIS — Z952 Presence of prosthetic heart valve: Secondary | ICD-10-CM

## 2020-07-18 ENCOUNTER — Other Ambulatory Visit: Payer: Self-pay | Admitting: Cardiovascular Disease

## 2020-07-18 DIAGNOSIS — I428 Other cardiomyopathies: Secondary | ICD-10-CM

## 2020-07-20 ENCOUNTER — Other Ambulatory Visit: Payer: Self-pay

## 2020-07-20 MED ORDER — LISINOPRIL 5 MG PO TABS: 5.0000 mg | ORAL_TABLET | Freq: Every day | ORAL | 0 refills | Status: DC

## 2020-08-21 ENCOUNTER — Ambulatory Visit (INDEPENDENT_AMBULATORY_CARE_PROVIDER_SITE_OTHER): Payer: BC Managed Care – PPO | Admitting: *Deleted

## 2020-08-21 ENCOUNTER — Other Ambulatory Visit: Payer: Self-pay

## 2020-08-21 DIAGNOSIS — Z952 Presence of prosthetic heart valve: Secondary | ICD-10-CM

## 2020-08-21 DIAGNOSIS — Z7901 Long term (current) use of anticoagulants: Secondary | ICD-10-CM | POA: Diagnosis not present

## 2020-08-21 LAB — POCT INR: INR: 2.6 (ref 2.0–3.0)

## 2020-08-21 NOTE — Patient Instructions (Signed)
Description   *DOES NOT NEED A PRINTOUT* Continue taking Warfarin 1.5 tablets daily except for 1 tablet on Fridays. Recheck INR in 6 weeks. Call with any changes [phone]. Keep the same diet you have been eating.      

## 2020-09-24 ENCOUNTER — Telehealth: Payer: Self-pay

## 2020-09-24 NOTE — Telephone Encounter (Signed)
Received message from scheduling requesting appointment.  Called and offered patient appointment tomorrow with Gillian Shields but he refused.  Scheduled him October 7 with Dr. [name]. He was grateful for call and agrees with plan.

## 2020-09-25 DIAGNOSIS — Z7901 Long term (current) use of anticoagulants: Secondary | ICD-10-CM

## 2020-10-02 ENCOUNTER — Other Ambulatory Visit: Payer: Self-pay

## 2020-10-02 ENCOUNTER — Ambulatory Visit (INDEPENDENT_AMBULATORY_CARE_PROVIDER_SITE_OTHER): Payer: BC Managed Care – PPO | Admitting: Pharmacist

## 2020-10-02 DIAGNOSIS — Z952 Presence of prosthetic heart valve: Secondary | ICD-10-CM

## 2020-10-02 DIAGNOSIS — Z7901 Long term (current) use of anticoagulants: Secondary | ICD-10-CM

## 2020-10-02 LAB — POCT INR: INR: 2.5 (ref 2.0–3.0)

## 2020-10-05 ENCOUNTER — Other Ambulatory Visit: Payer: Self-pay | Admitting: Cardiovascular Disease

## 2020-10-05 DIAGNOSIS — Z952 Presence of prosthetic heart valve: Secondary | ICD-10-CM

## 2020-10-06 NOTE — Telephone Encounter (Signed)
Prescription refill request received for warfarin Lov: Cooper, 08/12/2019 Next INR check: 8/5 Warfarin tablet strength: 7.5mg    Scheduled to see Dr [name] 01/2021

## 2020-11-13 ENCOUNTER — Ambulatory Visit (INDEPENDENT_AMBULATORY_CARE_PROVIDER_SITE_OTHER): Payer: BC Managed Care – PPO

## 2020-11-13 ENCOUNTER — Other Ambulatory Visit: Payer: Self-pay

## 2020-11-13 DIAGNOSIS — Z7901 Long term (current) use of anticoagulants: Secondary | ICD-10-CM

## 2020-11-13 DIAGNOSIS — Z952 Presence of prosthetic heart valve: Secondary | ICD-10-CM | POA: Diagnosis not present

## 2020-11-13 LAB — POCT INR: INR: 2.8 (ref 2.0–3.0)

## 2020-11-13 NOTE — Patient Instructions (Signed)
Description   *DOES NOT NEED A PRINTOUT* Continue taking Warfarin 1.5 tablets daily except for 1 tablet on Fridays. Recheck INR in 5 weeks. Call with any changes [phone]. Keep the same diet you have been eating.

## 2020-11-16 ENCOUNTER — Other Ambulatory Visit: Payer: Self-pay | Admitting: Cardiovascular Disease

## 2020-11-16 DIAGNOSIS — I428 Other cardiomyopathies: Secondary | ICD-10-CM

## 2020-12-09 ENCOUNTER — Telehealth: Payer: Self-pay | Admitting: *Deleted

## 2020-12-09 NOTE — Telephone Encounter (Signed)
   [facility] Pre-operative Risk Assessment    Patient Name: Jeffrey [name]  DOB: 03-22-1966 MRN: [id number]  HEARTCARE STAFF:  - IMPORTANT!!!!!! Under Visit Info/Reason for Call, type in Other and utilize the format Clearance MM/DD/YY or Clearance TBD. Do not use dashes or single digits. - Please review there is not already an duplicate clearance open for this procedure. - If request is for dental extraction, please clarify the # of teeth to be extracted. - If the patient is currently at the dentist's office, call Pre-Op Callback Staff (MA/nurse) to input urgent request.  - If the patient is not currently in the dentist office, please route to the Pre-Op pool.  Request for surgical clearance:  What type of surgery is being performed? 1 TOOTH TO BE EXTRACTED WITH BONE GRAFT  When is this surgery scheduled? TBD  What type of clearance is required (medical clearance vs. Pharmacy clearance to hold med vs. Both)? BOTH  Are there any medications that need to be held prior to surgery and how long? PER DR. THOMAS BAGLEY HE WILL HAVE PT HOLD ASA x 72 HOURS PRIOR TO PROCEDURE, THOUGH WILL HAVE PT CONTINUE ON WARFARIN AS USUAL  Practice name and name of physician performing surgery? [facility]; DR. Greggory Keen, DDS  What is the office phone number? [phone]   7.   What is the office fax number? [phone]; NEED TO CONFIRM FAX #  8.   Anesthesia type (None, local, MAC, general) ? LEFT MESSAGE TO CALL BACK WITH ANESTHESIA   Julaine Hua 12/09/2020, 4:12 PM  _________________________________________________________________   (provider comments below)

## 2020-12-10 NOTE — Telephone Encounter (Signed)
   Patient Name: Jeffrey [name]  DOB: [birth year] MRN: [id number]  Primary Cardiologist: [name], MD  Chart reviewed as part of pre-operative protocol coverage. Simple dental extractions are considered low risk procedures per guidelines and generally do not require any specific cardiac clearance. It is also generally accepted that for simple extractions and dental cleanings, there is no need to interrupt blood thinner (antiplatelet or anticoagulation) therapy.   SBE prophylaxis is required for the patient from a cardiac standpoint. Patient does not have a penicillin allergy so he can take Amoxicillin 2,000mg  one hour prior to dental procedure. I tried to call the patient to remind him of this but was unable to reach him or leave a message. Please make sure patient is aware of this before procedure. If you need [place] to send in this prescription, just let us know.  I will route this recommendation to the requesting party via Epic fax function and remove from pre-op pool.  Please call with questions.  Corrin Parker, PA-C 12/10/2020, 7:48 AM

## 2020-12-24 ENCOUNTER — Other Ambulatory Visit: Payer: Self-pay

## 2020-12-24 ENCOUNTER — Ambulatory Visit (INDEPENDENT_AMBULATORY_CARE_PROVIDER_SITE_OTHER): Payer: BC Managed Care – PPO | Admitting: *Deleted

## 2020-12-24 DIAGNOSIS — Z952 Presence of prosthetic heart valve: Secondary | ICD-10-CM | POA: Diagnosis not present

## 2020-12-24 DIAGNOSIS — Z7901 Long term (current) use of anticoagulants: Secondary | ICD-10-CM | POA: Diagnosis not present

## 2020-12-24 LAB — POCT INR: INR: 2.7 (ref 2.0–3.0)

## 2020-12-24 NOTE — Patient Instructions (Signed)
Description   *DOES NOT NEED A PRINTOUT* Continue taking Warfarin 1.5 tablets daily except for 1 tablet on Fridays. Recheck INR in 6 weeks. Call with any changes [phone]. Keep the same diet you have been eating.

## 2021-01-06 ENCOUNTER — Other Ambulatory Visit: Payer: BC Managed Care – PPO | Admitting: *Deleted

## 2021-01-06 ENCOUNTER — Other Ambulatory Visit: Payer: Self-pay

## 2021-01-06 DIAGNOSIS — Z7901 Long term (current) use of anticoagulants: Secondary | ICD-10-CM | POA: Diagnosis not present

## 2021-01-06 LAB — CBC WITH DIFFERENTIAL/PLATELET
Basophils Absolute: 0 10*3/uL (ref 0.0–0.2)
Basos: 1 %
EOS (ABSOLUTE): 0.1 10*3/uL (ref 0.0–0.4)
Eos: 3 %
Hematocrit: 44.2 % (ref 37.5–51.0)
Hemoglobin: 14.1 g/dL (ref 13.0–17.7)
Immature Grans (Abs): 0 10*3/uL (ref 0.0–0.1)
Immature Granulocytes: 0 %
Lymphocytes Absolute: 1.3 10*3/uL (ref 0.7–3.1)
Lymphs: 32 %
MCH: 26.6 pg (ref 26.6–33.0)
MCHC: 31.9 g/dL (ref 31.5–35.7)
MCV: 83 fL (ref 79–97)
Monocytes Absolute: 0.4 10*3/uL (ref 0.1–0.9)
Monocytes: 9 %
Neutrophils Absolute: 2.2 10*3/uL (ref 1.4–7.0)
Neutrophils: 55 %
Platelets: 166 10*3/uL (ref 150–450)
RBC: 5.31 x10E6/uL (ref 4.14–5.80)
RDW: 14.1 % (ref 11.6–15.4)
WBC: 4 10*3/uL (ref 3.4–10.8)

## 2021-01-06 LAB — BASIC METABOLIC PANEL
BUN/Creatinine Ratio: 14 (ref 9–20)
BUN: 10 mg/dL (ref 6–24)
CO2: 21 mmol/L (ref 20–29)
Calcium: 9.2 mg/dL (ref 8.7–10.2)
Chloride: 103 mmol/L (ref 96–106)
Creatinine, Ser: 0.74 mg/dL — ABNORMAL LOW (ref 0.76–1.27)
Glucose: 103 mg/dL — ABNORMAL HIGH (ref 70–99)
Potassium: 4.6 mmol/L (ref 3.5–5.2)
Sodium: 137 mmol/L (ref 134–144)
eGFR: 107 mL/min/{1.73_m2} (ref >59)

## 2021-01-15 ENCOUNTER — Encounter: Payer: Self-pay | Admitting: Cardiovascular Disease

## 2021-01-15 ENCOUNTER — Ambulatory Visit (INDEPENDENT_AMBULATORY_CARE_PROVIDER_SITE_OTHER): Payer: BC Managed Care – PPO | Admitting: Cardiovascular Disease

## 2021-01-15 ENCOUNTER — Other Ambulatory Visit: Payer: Self-pay

## 2021-01-15 VITALS — BP 124/70 | HR 54 | Ht 70.0 in | Wt 211.0 lb

## 2021-01-15 DIAGNOSIS — Z952 Presence of prosthetic heart valve: Secondary | ICD-10-CM | POA: Diagnosis not present

## 2021-01-15 DIAGNOSIS — I10 Essential (primary) hypertension: Secondary | ICD-10-CM | POA: Diagnosis not present

## 2021-01-15 NOTE — Progress Notes (Signed)
Cardiology Office Note:    Date:  01/15/2021   ID:  Jeffrey [name], DOB May 08, 1965, MRN [id number]  PCP:  Marden Noble, MD   [facility] Providers Cardiologist:  [name], MD     Referring MD: Marden Noble, MD   Chief Complaint  Patient presents with   Follow-up    History of Present Illness:    Jeffrey [name] is a 55 y.o. male with a hx of ascending thoracic aortic aneurysm and severe aortic valve insufficiency who underwent Bentall surgery with a Saint Jude 27 mm mechanical valve and a 30 mm Dacron aortic graft in 2015.  The patient is here alone today.  He is doing well at present.  He had an echocardiogram last year showing normal function of his aortic prosthesis.  He has had no medicine changes.  He specifically denies chest pain, chest pressure, shortness of breath, leg swelling, or heart palpitations.  Has had no stroke or TIA symptoms.  He is tolerating warfarin and aspirin without bleeding problems.  He needs to have a root canal and a crown implant and has some questions about management of his anticoagulation and SBE prophylaxis.  The dentist office wants him off of aspirin for 3 days before the procedure.  See assessment and plan for further discussion.  It appears that he is able to continue on warfarin without interruption.  They would like an INR checked the day of his procedure.  Past Medical History:  Diagnosis Date   Aortic insufficiency    a. 02/2014 - admx with sx severe AI and assoc ascending aortic aneurysm (6.8 cm) >> s/p Bentall with mechancial AVR (Dr. Donata Clay)   Hx of cardiac catheterization    a. LHC (11/15):  EF 55%, marked ascending aortic dilatation with severe AI on aortic root angiography, normal coronary arteries   Hx of echocardiogram    a. Echo 11/15: EF 55%, severe AI, marked dilation of ascending aorta; b. Echo 12/15: EF 30-35%, large effusion; c. Echo 2/16: EF 40-45%, no RWMA, mechanical AVR ok, trivial effusion post to heart     NICM (nonischemic cardiomyopathy) (HCC)    a. TEE (11/15): Mild LVH, EF 45-50%, diffuse HK, prominent apical trabeculations, severe AI, severely dilated ascending aorta (6.9 cm), mild MR;  b. EF 30-35% post AVR >> c. EF 40-45% by echo 05/2014   Pericardial effusion    large pericardial effusion post Bentall procedure >> s/p pericardial window   Thoracic ascending aortic aneurysm    Echo 11/15: severe AI, severely dilated ascending aorta (6.5 cm);  s/p Bentall 02/2014 with aortic root replacement using a mechanical valve-conduit and reimplantation of coronary arteries (St. Jude 27 mm valve), replacement of ascending aorta to the proximal arch with a 30 mm Dacron graft    Past Surgical History:  Procedure Laterality Date   BENTALL PROCEDURE N/A 02/24/2014   Procedure: BENTALL PROCEDURE;  Surgeon: Kerin Perna, MD;  Location: [facility] OR;  Service: Open Heart Surgery;  Laterality: N/A;   INTRAOPERATIVE TRANSESOPHAGEAL ECHOCARDIOGRAM N/A 02/24/2014   Procedure: INTRAOPERATIVE TRANSESOPHAGEAL ECHOCARDIOGRAM;  Surgeon: Kerin Perna, MD;  Location: [facility] OR;  Service: Open Heart Surgery;  Laterality: N/A;   LEFT AND RIGHT HEART CATHETERIZATION WITH CORONARY ANGIOGRAM N/A 02/20/2014   Procedure: LEFT AND RIGHT HEART CATHETERIZATION WITH CORONARY ANGIOGRAM;  Surgeon: Micheline Chapman, MD;  Location: [facility] CATH LAB;  Service: Cardiovascular;  Laterality: N/A;   SUBXYPHOID PERICARDIAL WINDOW N/A 04/02/2014   Procedure: SUBXYPHOID PERICARDIAL WINDOW;  Surgeon: Theron Arista  Donata Clay, MD;  Location: [facility] OR;  Service: Thoracic;  Laterality: N/A;   TEE WITHOUT CARDIOVERSION N/A 02/20/2014   Procedure: TRANSESOPHAGEAL ECHOCARDIOGRAM (TEE);  Surgeon: Lars Masson, MD;  Location: [facility] ENDOSCOPY;  Service: Cardiovascular;  Laterality: N/A;   TEE WITHOUT CARDIOVERSION N/A 04/02/2014   Procedure: TRANSESOPHAGEAL ECHOCARDIOGRAM (TEE);  Surgeon: Kerin Perna, MD;  Location: [facility] OR;  Service: Thoracic;  Laterality: N/A;     Current Medications: Current Meds  Medication Sig   acetaminophen (TYLENOL) 500 MG tablet Take 500 mg by mouth every 6 (six) hours as needed for mild pain or fever.    amoxicillin (AMOXIL) 500 MG tablet Take all 4 tablets one hour prior to procedure   carvedilol (COREG) 12.5 MG tablet TAKE 1 TABLET(12.5 MG) BY MOUTH TWICE DAILY WITH A MEAL   lisinopril (ZESTRIL) 5 MG tablet TAKE 1 TABLET(5 MG) BY MOUTH DAILY. PLEASE MAKE YEARLY APPOINTMENT WITH DOCTOR Wren Gallaga BEFORE ANYMORE REFILLS. THANK YOU FIRST ATTEMPT   warfarin (COUMADIN) 7.5 MG tablet Take as directed by the coumadin clinic   zolpidem (AMBIEN) 10 MG tablet Take 5 mg by mouth daily.   [DISCONTINUED] aspirin 81 MG EC tablet Take 1 tablet (81 mg total) by mouth daily.     Allergies:   Patient has no known allergies.   Social History   Socioeconomic History   Marital status: Married    Spouse name: Not on file   Number of children: Not on file   Years of education: Not on file   Highest education level: Not on file  Occupational History   Not on file  Tobacco Use   Smoking status: Never   Smokeless tobacco: Never  Vaping Use   Vaping Use: Never used  Substance and Sexual Activity   Alcohol use: No   Drug use: No   Sexual activity: Not on file  Other Topics Concern   Not on file  Social History Narrative   Not on file   Social Determinants of Health   Financial Resource Strain: Not on file  Food Insecurity: Not on file  Transportation Needs: Not on file  Physical Activity: Not on file  Stress: Not on file  Social Connections: Not on file     Family History: The patient's family history includes Cancer in his maternal grandmother. There is no history of Heart attack or Stroke.  ROS:   Please see the history of present illness.    All other systems reviewed and are negative.  EKGs/Labs/Other Studies Reviewed:    The following studies were reviewed today: Echo 08/09/2019: IMPRESSIONS     1. Left  ventricular ejection fraction, by estimation, is 55 to 60%. The  left ventricle has normal function. The left ventricle demonstrates  regional wall motion abnormalities (see scoring diagram/findings for  description). Left ventricular diastolic  parameters are indeterminate.   2. Right ventricular systolic function is normal. The right ventricular  size is normal.   3. The mitral valve is abnormal. No evidence of mitral valve  regurgitation.   4. St. Jude 27 mm mechanical valve in the aortic position (Procedure date  11/16/ 2015). Peak and mean gradients through the valve are 13 and 6 mm Hg  respectively. The aortic valve has been repaired/replaced. Aortic valve  regurgitation is trivial.   Comparison(s): 06/14/17 EF 55-60%. AV mean PG, peak PG.   EKG:  EKG is ordered today.  The ekg ordered today demonstrates sinus bradycardia 54 bpm, nonspecific ST abnormality.  Recent Labs: 01/06/2021: BUN 10; Creatinine, Ser 0.74; Hemoglobin 14.1; Platelets 166; Potassium 4.6; Sodium 137  Recent Lipid Panel No results found for: CHOL, TRIG, HDL, CHOLHDL, VLDL, LDLCALC, LDLDIRECT   Risk Assessment/Calculations:           Physical Exam:    VS:  BP 124/70   Pulse (!) 54   Ht 5\' 10"  (1.778 m)   Wt 211 lb (95.7 kg)   SpO2 99%   BMI 30.28 kg/m     Wt Readings from Last 3 Encounters:  01/15/21 211 lb (95.7 kg)  08/12/19 201 lb (91.2 kg)  08/02/18 192 lb (87.1 kg)     GEN:  Well nourished, well developed in no acute distress HEENT: Normal NECK: No JVD; No carotid bruits LYMPHATICS: No lymphadenopathy CARDIAC: RRR, crisp mechanical A2 RESPIRATORY:  Clear to auscultation without rales, wheezing or rhonchi  ABDOMEN: Soft, non-tender, non-distended MUSCULOSKELETAL:  No edema; No deformity  SKIN: Warm and dry NEUROLOGIC:  Alert and oriented x 3 PSYCHIATRIC:  Normal affect   ASSESSMENT:    1. S/P AVR (aortic valve replacement) and aortoplasty   2. Essential hypertension     PLAN:    In order of problems listed above:  The patient's echocardiogram from last year is reviewed and demonstrates normal function of his mechanical prosthesis.  We discussed recent guideline update for patients with valvular heart disease and the patient no longer requires low-dose aspirin as he has no high risk features that would be predictive of thrombotic events.  He will remain on warfarin with a goal INR of 2.5.  He will discontinue aspirin.  I will see him back in 1 year.  I will plan on a follow-up echo in 2024.  He understands the lifelong indication for SBE prophylaxis when indicated. Blood pressure well controlled on a combination of lisinopril and carvedilol.  Recent labs reviewed with creatinine 0.74 and potassium 4.6.  No changes are made today.        Medication Adjustments/Labs and Tests Ordered: Current medicines are reviewed at length with the patient today.  Concerns regarding medicines are outlined above.  Orders Placed This Encounter  Procedures   EKG 12-Lead   No orders of the defined types were placed in this encounter.   Patient Instructions  Medication Instructions:  1) DISCONTINUE Aspirin  *If you need a refill on your cardiac medications before your next appointment, please call your pharmacy*   Lab Work: None If you have labs (blood work) drawn today and your tests are completely normal, you will receive your results only by: MyChart Message (if you have MyChart) OR A paper copy in the mail If you have any lab test that is abnormal or we need to change your treatment, we will call you to review the results.   Testing/Procedures: None   Follow-Up: At [facility], you and your health needs are our priority.  As part of our continuing mission to provide you with exceptional heart care, we have created designated Provider Care Teams.  These Care Teams include your primary Cardiologist (physician) and Advanced Practice Providers (APPs -   Physician Assistants and Nurse Practitioners) who all work together to provide you with the care you need, when you need it.  We recommend signing up for the patient portal called "MyChart".  Sign up information is provided on this After Visit Summary.  MyChart is used to connect with patients for Virtual Visits (Telemedicine).  Patients are able to view lab/test results, encounter  notes, upcoming appointments, etc.  Non-urgent messages can be sent to your provider as well.   To learn more about what you can do with MyChart, go to [email or web address].    Your next appointment:   1 year(s)  The format for your next appointment:   In Person  Provider:   You may see [name], MD or one of the following Advanced Practice Providers on your designated Care Team:   Tereso Newcomer, PA-C Chelsea Aus, [place]   Other Instructions     Signed, [name], MD  01/15/2021 10:26 AM    [facility]

## 2021-01-15 NOTE — Patient Instructions (Signed)
Medication Instructions:  1) DISCONTINUE Aspirin  *If you need a refill on your cardiac medications before your next appointment, please call your pharmacy*   Lab Work: None If you have labs (blood work) drawn today and your tests are completely normal, you will receive your results only by: MyChart Message (if you have MyChart) OR A paper copy in the mail If you have any lab test that is abnormal or we need to change your treatment, we will call you to review the results.   Testing/Procedures: None   Follow-Up: At [facility], you and your health needs are our priority.  As part of our continuing mission to provide you with exceptional heart care, we have created designated Provider Care Teams.  These Care Teams include your primary Cardiologist (physician) and Advanced Practice Providers (APPs -  Physician Assistants and Nurse Practitioners) who all work together to provide you with the care you need, when you need it.  We recommend signing up for the patient portal called "MyChart".  Sign up information is provided on this After Visit Summary.  MyChart is used to connect with patients for Virtual Visits (Telemedicine).  Patients are able to view lab/test results, encounter notes, upcoming appointments, etc.  Non-urgent messages can be sent to your provider as well.   To learn more about what you can do with MyChart, go to [email or web address].    Your next appointment:   1 year(s)  The format for your next appointment:   In Person  Provider:   You may see [name], MD or one of the following Advanced Practice Providers on your designated Care Team:   Tereso Newcomer, PA-C Chelsea Aus, [place]   Other Instructions

## 2021-01-17 ENCOUNTER — Other Ambulatory Visit: Payer: Self-pay | Admitting: Cardiovascular Disease

## 2021-01-17 DIAGNOSIS — Z952 Presence of prosthetic heart valve: Secondary | ICD-10-CM

## 2021-01-19 DIAGNOSIS — Z125 Encounter for screening for malignant neoplasm of prostate: Secondary | ICD-10-CM | POA: Diagnosis not present

## 2021-01-19 DIAGNOSIS — E781 Pure hyperglyceridemia: Secondary | ICD-10-CM | POA: Diagnosis not present

## 2021-01-19 DIAGNOSIS — L918 Other hypertrophic disorders of the skin: Secondary | ICD-10-CM | POA: Diagnosis not present

## 2021-01-19 DIAGNOSIS — Z79899 Other long term (current) drug therapy: Secondary | ICD-10-CM | POA: Diagnosis not present

## 2021-01-19 DIAGNOSIS — Z0001 Encounter for general adult medical examination with abnormal findings: Secondary | ICD-10-CM | POA: Diagnosis not present

## 2021-01-19 DIAGNOSIS — Z954 Presence of other heart-valve replacement: Secondary | ICD-10-CM | POA: Diagnosis not present

## 2021-01-19 DIAGNOSIS — Z23 Encounter for immunization: Secondary | ICD-10-CM | POA: Diagnosis not present

## 2021-01-19 DIAGNOSIS — E119 Type 2 diabetes mellitus without complications: Secondary | ICD-10-CM | POA: Diagnosis not present

## 2021-01-19 DIAGNOSIS — H6123 Impacted cerumen, bilateral: Secondary | ICD-10-CM | POA: Diagnosis not present

## 2021-02-04 ENCOUNTER — Other Ambulatory Visit: Payer: Self-pay

## 2021-02-04 ENCOUNTER — Ambulatory Visit (INDEPENDENT_AMBULATORY_CARE_PROVIDER_SITE_OTHER): Payer: BC Managed Care – PPO

## 2021-02-04 DIAGNOSIS — Z7901 Long term (current) use of anticoagulants: Secondary | ICD-10-CM

## 2021-02-04 DIAGNOSIS — Z952 Presence of prosthetic heart valve: Secondary | ICD-10-CM | POA: Diagnosis not present

## 2021-02-04 LAB — POCT INR: INR: 3 (ref 2.0–3.0)

## 2021-02-04 NOTE — Patient Instructions (Signed)
Description   *DOES NOT NEED A PRINTOUT* Continue taking Warfarin 1.5 tablets daily except for 1 tablet on Fridays. Recheck INR in 6 weeks. Call with any changes [phone]. Keep the same diet you have been eating.

## 2021-02-15 ENCOUNTER — Other Ambulatory Visit: Payer: Self-pay | Admitting: Cardiovascular Disease

## 2021-02-15 DIAGNOSIS — I428 Other cardiomyopathies: Secondary | ICD-10-CM

## 2021-03-12 ENCOUNTER — Other Ambulatory Visit: Payer: Self-pay | Admitting: Cardiovascular Disease

## 2021-03-12 NOTE — Telephone Encounter (Signed)
Prescription refill request received for warfarin Lov: 01/15/21 [name])  Next INR check: 03/17/21 Warfarin tablet strength: 7.5mg   Appropriate dose and refill sent to requested pharmacy.

## 2021-03-17 ENCOUNTER — Other Ambulatory Visit: Payer: Self-pay

## 2021-03-17 ENCOUNTER — Ambulatory Visit: Payer: BC Managed Care – PPO | Admitting: *Deleted

## 2021-03-17 DIAGNOSIS — Z7901 Long term (current) use of anticoagulants: Secondary | ICD-10-CM

## 2021-03-17 DIAGNOSIS — Z952 Presence of prosthetic heart valve: Secondary | ICD-10-CM | POA: Diagnosis not present

## 2021-03-17 LAB — POCT INR: INR: 2 (ref 2.0–3.0)

## 2021-03-17 NOTE — Patient Instructions (Signed)
Description   *DOES NOT NEED A PRINTOUT* Continue taking Warfarin 1.5 tablets daily except for 1 tablet on Fridays. Recheck INR in 6 weeks. Call with any changes [phone]. Keep the same diet you have been eating.

## 2021-04-30 ENCOUNTER — Other Ambulatory Visit: Payer: Self-pay

## 2021-04-30 ENCOUNTER — Ambulatory Visit (INDEPENDENT_AMBULATORY_CARE_PROVIDER_SITE_OTHER): Payer: BC Managed Care – PPO | Admitting: *Deleted

## 2021-04-30 DIAGNOSIS — Z952 Presence of prosthetic heart valve: Secondary | ICD-10-CM | POA: Diagnosis not present

## 2021-04-30 DIAGNOSIS — Z7901 Long term (current) use of anticoagulants: Secondary | ICD-10-CM

## 2021-04-30 LAB — POCT INR: INR: 3.2 — AB (ref 2.0–3.0)

## 2021-04-30 NOTE — Patient Instructions (Signed)
Description   *DOES NOT NEED A PRINTOUT* Today take 1/2 tablet then continue taking Warfarin 1.5 tablets daily except for 1 tablet on Fridays. Recheck INR in 5 weeks. Call with any changes [phone]. Keep the same diet you have been eating.

## 2021-06-04 ENCOUNTER — Other Ambulatory Visit: Payer: Self-pay

## 2021-06-04 ENCOUNTER — Ambulatory Visit (INDEPENDENT_AMBULATORY_CARE_PROVIDER_SITE_OTHER): Payer: BC Managed Care – PPO | Admitting: *Deleted

## 2021-06-04 DIAGNOSIS — Z952 Presence of prosthetic heart valve: Secondary | ICD-10-CM | POA: Diagnosis not present

## 2021-06-04 DIAGNOSIS — Z7901 Long term (current) use of anticoagulants: Secondary | ICD-10-CM | POA: Diagnosis not present

## 2021-06-04 DIAGNOSIS — Z5181 Encounter for therapeutic drug level monitoring: Secondary | ICD-10-CM | POA: Diagnosis not present

## 2021-06-04 LAB — POCT INR: INR: 2.2 (ref 2.0–3.0)

## 2021-06-04 NOTE — Patient Instructions (Signed)
Description   *DOES NOT NEED A PRINTOUT* Continue taking Warfarin 1.5 tablets daily except for 1 tablet on Fridays. Recheck INR in 6 weeks. Call with any changes [phone]. Keep the same diet you have been eating.      

## 2021-06-10 ENCOUNTER — Other Ambulatory Visit: Payer: Self-pay | Admitting: Cardiovascular Disease

## 2021-07-16 ENCOUNTER — Ambulatory Visit (INDEPENDENT_AMBULATORY_CARE_PROVIDER_SITE_OTHER): Payer: Managed Care, Other (non HMO)

## 2021-07-16 DIAGNOSIS — Z7901 Long term (current) use of anticoagulants: Secondary | ICD-10-CM | POA: Diagnosis not present

## 2021-07-16 DIAGNOSIS — Z952 Presence of prosthetic heart valve: Secondary | ICD-10-CM

## 2021-07-16 LAB — POCT INR: INR: 2.4 (ref 2.0–3.0)

## 2021-07-16 NOTE — Patient Instructions (Signed)
Description   *DOES NOT NEED A PRINTOUT* Continue taking Warfarin 1.5 tablets daily except for 1 tablet on Fridays. Recheck INR in 6 weeks. Call with any changes [phone]. Keep the same diet you have been eating.      

## 2021-08-27 ENCOUNTER — Ambulatory Visit (INDEPENDENT_AMBULATORY_CARE_PROVIDER_SITE_OTHER): Payer: Managed Care, Other (non HMO)

## 2021-08-27 DIAGNOSIS — Z7901 Long term (current) use of anticoagulants: Secondary | ICD-10-CM

## 2021-08-27 DIAGNOSIS — Z952 Presence of prosthetic heart valve: Secondary | ICD-10-CM

## 2021-08-27 LAB — POCT INR: INR: 3 (ref 2.0–3.0)

## 2021-08-27 NOTE — Patient Instructions (Signed)
Description   *DOES NOT NEED A PRINTOUT* Continue taking Warfarin 1.5 tablets daily except for 1 tablet on Fridays. Recheck INR in 6 weeks. Call with any changes [phone]. Keep the same diet you have been eating.      

## 2021-10-08 ENCOUNTER — Ambulatory Visit (INDEPENDENT_AMBULATORY_CARE_PROVIDER_SITE_OTHER): Payer: Managed Care, Other (non HMO)

## 2021-10-08 DIAGNOSIS — Z7901 Long term (current) use of anticoagulants: Secondary | ICD-10-CM

## 2021-10-08 DIAGNOSIS — Z952 Presence of prosthetic heart valve: Secondary | ICD-10-CM | POA: Diagnosis not present

## 2021-10-08 LAB — POCT INR: INR: 2.6 (ref 2.0–3.0)

## 2021-10-08 NOTE — Patient Instructions (Signed)
*  DOES NOT NEED A PRINTOUT*  Continue taking Warfarin 1.5 tablets daily except for 1 tablet on Fridays.  Recheck INR in 6 weeks.  Call with any changes [phone].  Keep the same diet you have been eating.

## 2021-10-25 ENCOUNTER — Other Ambulatory Visit: Payer: Self-pay | Admitting: Cardiovascular Disease

## 2021-10-25 DIAGNOSIS — Z952 Presence of prosthetic heart valve: Secondary | ICD-10-CM

## 2021-10-25 NOTE — Telephone Encounter (Signed)
Prescription refill request received for warfarin Lov: 01/15/21 Jeffrey [name]) Next INR check: 11/19/21 Warfarin tablet strength: 7.5mg   Appropriate dose and refill sent to requested pharmacy.

## 2021-11-02 ENCOUNTER — Other Ambulatory Visit: Payer: Self-pay | Admitting: Cardiovascular Disease

## 2021-11-02 DIAGNOSIS — Z952 Presence of prosthetic heart valve: Secondary | ICD-10-CM

## 2021-11-03 ENCOUNTER — Other Ambulatory Visit: Payer: Self-pay | Admitting: Cardiovascular Disease

## 2021-11-03 DIAGNOSIS — Z952 Presence of prosthetic heart valve: Secondary | ICD-10-CM

## 2021-11-03 NOTE — Telephone Encounter (Signed)
Prescription refill request received for warfarin Lov: 01/15/21 ([place]) Next INR check: 11/19/21 Warfarin tablet strength: 7.5mg  Appropriate dose and refill sent to requested pharmacy.  

## 2021-11-19 ENCOUNTER — Ambulatory Visit (INDEPENDENT_AMBULATORY_CARE_PROVIDER_SITE_OTHER): Payer: Managed Care, Other (non HMO)

## 2021-11-19 DIAGNOSIS — Z952 Presence of prosthetic heart valve: Secondary | ICD-10-CM

## 2021-11-19 DIAGNOSIS — Z7901 Long term (current) use of anticoagulants: Secondary | ICD-10-CM

## 2021-11-19 LAB — POCT INR: INR: 2.5 (ref 2.0–3.0)

## 2021-11-19 NOTE — Patient Instructions (Signed)
*  DOES NOT NEED A PRINTOUT*  Continue taking Warfarin 1.5 tablets daily except for 1 tablet on Fridays.  Recheck INR in 8 weeks.  Call with any changes [phone].  Keep the same diet you have been eating.  

## 2021-12-27 ENCOUNTER — Telehealth: Payer: Self-pay | Admitting: Cardiovascular Disease

## 2021-12-27 NOTE — Telephone Encounter (Signed)
Patient called and wanted to know if Dr. Burt Knack wants him to have an echocardiogram before next appt in November. Patient would like a call back to discuss

## 2021-12-27 NOTE — Telephone Encounter (Signed)
Reviewed my last note. Planned to repeat in 2024.

## 2021-12-27 NOTE — Telephone Encounter (Signed)
Attempted to call patient, but no answer and voicemail box is full. Will send MyChart message at this time (he's an active user).

## 2022-01-14 ENCOUNTER — Ambulatory Visit: Payer: Managed Care, Other (non HMO) | Attending: Cardiovascular Disease

## 2022-01-14 DIAGNOSIS — Z7901 Long term (current) use of anticoagulants: Secondary | ICD-10-CM | POA: Diagnosis not present

## 2022-01-14 DIAGNOSIS — Z952 Presence of prosthetic heart valve: Secondary | ICD-10-CM

## 2022-01-14 LAB — POCT INR: INR: 2.3 (ref 2.0–3.0)

## 2022-01-14 NOTE — Patient Instructions (Signed)
*  DOES NOT NEED A PRINTOUT*  Continue taking Warfarin 1.5 tablets daily except for 1 tablet on Fridays.  Recheck INR in 8 weeks.  Call with any changes [phone].  Keep the same diet you have been eating.  

## 2022-01-24 ENCOUNTER — Other Ambulatory Visit: Payer: Self-pay | Admitting: Cardiovascular Disease

## 2022-01-24 DIAGNOSIS — Z952 Presence of prosthetic heart valve: Secondary | ICD-10-CM

## 2022-01-24 NOTE — Telephone Encounter (Signed)
Last INR visit 01/14/2022 Last OV 01/15/2021 and pending appt 02/23/2022

## 2022-01-27 ENCOUNTER — Encounter: Payer: Self-pay | Admitting: Cardiovascular Disease

## 2022-01-27 ENCOUNTER — Other Ambulatory Visit: Payer: Self-pay | Admitting: Cardiovascular Disease

## 2022-01-27 DIAGNOSIS — I428 Other cardiomyopathies: Secondary | ICD-10-CM

## 2022-01-27 DIAGNOSIS — Z952 Presence of prosthetic heart valve: Secondary | ICD-10-CM

## 2022-01-28 ENCOUNTER — Other Ambulatory Visit: Payer: Self-pay

## 2022-01-28 MED ORDER — LISINOPRIL 5 MG PO TABS: 5.0000 mg | ORAL_TABLET | Freq: Every day | ORAL | 3 refills | Status: DC

## 2022-01-28 MED ORDER — CARVEDILOL 12.5 MG PO TABS: ORAL_TABLET | ORAL | 3 refills | Status: DC

## 2022-02-02 ENCOUNTER — Other Ambulatory Visit: Payer: Self-pay

## 2022-02-02 ENCOUNTER — Other Ambulatory Visit: Payer: Self-pay | Admitting: *Deleted

## 2022-02-02 DIAGNOSIS — I428 Other cardiomyopathies: Secondary | ICD-10-CM

## 2022-02-02 DIAGNOSIS — Z952 Presence of prosthetic heart valve: Secondary | ICD-10-CM

## 2022-02-02 MED ORDER — LISINOPRIL 5 MG PO TABS: 5.0000 mg | ORAL_TABLET | Freq: Every day | ORAL | 0 refills | Status: DC

## 2022-02-02 MED ORDER — CARVEDILOL 12.5 MG PO TABS: ORAL_TABLET | ORAL | 0 refills | Status: DC

## 2022-02-02 MED ORDER — WARFARIN SODIUM 7.5 MG PO TABS: ORAL_TABLET | ORAL | 1 refills | Status: DC

## 2022-02-02 NOTE — Telephone Encounter (Signed)
Last INR 01/14/2022 Last OV 01/15/2021 and pending appt on 02/23/2022

## 2022-02-02 NOTE — Addendum Note (Signed)
Addended by: Carter Kitten D on: 02/02/2022 08:23 AM   Modules accepted: Orders

## 2022-02-14 LAB — COLOGUARD: COLOGUARD: NEGATIVE

## 2022-02-23 ENCOUNTER — Ambulatory Visit: Payer: Managed Care, Other (non HMO) | Attending: Cardiovascular Disease | Admitting: Cardiovascular Disease

## 2022-02-23 ENCOUNTER — Encounter: Payer: Self-pay | Admitting: Cardiovascular Disease

## 2022-02-23 VITALS — BP 120/72 | HR 53 | Ht 70.0 in | Wt 216.0 lb

## 2022-02-23 DIAGNOSIS — Z7901 Long term (current) use of anticoagulants: Secondary | ICD-10-CM | POA: Diagnosis not present

## 2022-02-23 DIAGNOSIS — Z952 Presence of prosthetic heart valve: Secondary | ICD-10-CM

## 2022-02-23 DIAGNOSIS — I10 Essential (primary) hypertension: Secondary | ICD-10-CM | POA: Diagnosis not present

## 2022-02-23 DIAGNOSIS — I7121 Aneurysm of the ascending aorta, without rupture: Secondary | ICD-10-CM | POA: Diagnosis not present

## 2022-02-23 NOTE — Patient Instructions (Signed)
Medication Instructions:  Your physician recommends that you continue on your current medications as directed. Please refer to the Current Medication list given to you today.  *If you need a refill on your cardiac medications before your next appointment, please call your pharmacy*   Lab Work: NONE If you have labs (blood work) drawn today and your tests are completely normal, you will receive your results only by: MyChart Message (if you have MyChart) OR A paper copy in the mail If you have any lab test that is abnormal or we need to change your treatment, we will call you to review the results.   Testing/Procedures: ECHO (in 1 year prior to visit) Your physician has requested that you have an echocardiogram. Echocardiography is a painless test that uses sound waves to create images of your heart. It provides your doctor with information about the size and shape of your heart and how well your heart's chambers and valves are working. This procedure takes approximately one hour. There are no restrictions for this procedure. Please do NOT wear cologne, perfume, aftershave, or lotions (deodorant is allowed). Please arrive 15 minutes prior to your appointment time.  Follow-Up: At [facility], you and your health needs are our priority.  As part of our continuing mission to provide you with exceptional heart care, we have created designated Provider Care Teams.  These Care Teams include your primary Cardiologist (physician) and Advanced Practice Providers (APPs -  Physician Assistants and Nurse Practitioners) who all work together to provide you with the care you need, when you need it.  Your next appointment:   1 year(s)  The format for your next appointment:   In Person  Provider:   [name], MD  or APP     Important Information About Sugar

## 2022-02-23 NOTE — Progress Notes (Signed)
Cardiology Office Note:    Date:  02/23/2022   ID:  Jeffrey [name], DOB [birth year], MRN [id number]  PCP:  Josetta Huddle, MD   [place] Providers Cardiologist:  Sherren Mocha, MD     Referring MD: Josetta Huddle, MD   Chief Complaint  Patient presents with   Follow-up    S/P Bentall with mechanical aortic valve    History of Present Illness:    Jeffrey [name] is a 56 y.o. male with a hx of ascending thoracic aortic aneurysm and severe aortic valve insufficiency who underwent Bentall surgery with a Saint Jude 27 mm mechanical valve and a 30 mm Dacron aortic graft in 2015.  He has done well with tolerance of long-term warfarin and has had no bleeding problems.  He has GI surveillance with a Cologuard every 3 years and states he has not had blood in his stool.  He has regular dental work but has been reluctant to pursue a root canal that he is supposed to have done secondary to cost.  He feels very well.  He walks 10,000 steps a day with no symptoms associated with this.  He denies chest pain, chest pressure, shortness of breath, heart palpitations, or leg swelling.  Past Medical History:  Diagnosis Date   Aortic insufficiency    a. 02/2014 - admx with sx severe AI and assoc ascending aortic aneurysm (6.8 cm) >> s/p Bentall with mechancial AVR (Dr. Prescott Gum)   Hx of cardiac catheterization    a. LHC (11/15):  EF 55%, marked ascending aortic dilatation with severe AI on aortic root angiography, normal coronary arteries   Hx of echocardiogram    a. Echo 11/15: EF 55%, severe AI, marked dilation of ascending aorta; b. Echo 12/15: EF 30-35%, large effusion; c. Echo 2/16: EF 40-45%, no RWMA, mechanical AVR ok, trivial effusion post to heart    NICM (nonischemic cardiomyopathy) ([place])    a. TEE (11/15): Mild LVH, EF 45-50%, diffuse HK, prominent apical trabeculations, severe AI, severely dilated ascending aorta (6.9 cm), mild MR;  b. EF 30-35% post AVR >> c. EF 40-45% by  echo 05/2014   Pericardial effusion    large pericardial effusion post Bentall procedure >> s/p pericardial window   Thoracic ascending aortic aneurysm ([place])    Echo 11/15: severe AI, severely dilated ascending aorta (6.5 cm);  s/p Bentall 02/2014 with aortic root replacement using a mechanical valve-conduit and reimplantation of coronary arteries (St. Jude 27 mm valve), replacement of ascending aorta to the proximal arch with a 30 mm Dacron graft    Past Surgical History:  Procedure Laterality Date   BENTALL PROCEDURE N/A 02/24/2014   Procedure: BENTALL PROCEDURE;  Surgeon: Ivin Poot, MD;  Location: [place];  Service: Open Heart Surgery;  Laterality: N/A;   INTRAOPERATIVE TRANSESOPHAGEAL ECHOCARDIOGRAM N/A 02/24/2014   Procedure: INTRAOPERATIVE TRANSESOPHAGEAL ECHOCARDIOGRAM;  Surgeon: Ivin Poot, MD;  Location: [place];  Service: Open Heart Surgery;  Laterality: N/A;   LEFT AND RIGHT HEART CATHETERIZATION WITH CORONARY ANGIOGRAM N/A 02/20/2014   Procedure: LEFT AND RIGHT HEART CATHETERIZATION WITH CORONARY ANGIOGRAM;  Surgeon: Blane Ohara, MD;  Location: [facility] CATH LAB;  Service: Cardiovascular;  Laterality: N/A;   SUBXYPHOID PERICARDIAL WINDOW N/A 04/02/2014   Procedure: SUBXYPHOID PERICARDIAL WINDOW;  Surgeon: Ivin Poot, MD;  Location: [place];  Service: Thoracic;  Laterality: N/A;   TEE WITHOUT CARDIOVERSION N/A 02/20/2014   Procedure: TRANSESOPHAGEAL ECHOCARDIOGRAM (TEE);  Surgeon: Dorothy Spark, MD;  Location: [facility]  ENDOSCOPY;  Service: Cardiovascular;  Laterality: N/A;   TEE WITHOUT CARDIOVERSION N/A 04/02/2014   Procedure: TRANSESOPHAGEAL ECHOCARDIOGRAM (TEE);  Surgeon: Ivin Poot, MD;  Location: [facility] OR;  Service: Thoracic;  Laterality: N/A;    Current Medications: Current Meds  Medication Sig   acetaminophen (TYLENOL) 500 MG tablet Take 500 mg by mouth every 6 (six) hours as needed for mild pain or fever.    carvedilol (COREG) 12.5 MG tablet TAKE 1 TABLET(12.5  MG) BY MOUTH TWICE DAILY WITH A MEAL   lisinopril (ZESTRIL) 5 MG tablet Take 1 tablet (5 mg total) by mouth daily.   rosuvastatin (CRESTOR) 5 MG tablet Take 5 mg by mouth at bedtime.   warfarin (COUMADIN) 7.5 MG tablet TAKE 1 AND 1/2 TABLETS BY MOUTH DAILY EXCEPT 1 TABLET ON FRIDAYS OR AS DIRECTED BY ANTICOAGULATION CLINIC   zolpidem (AMBIEN) 10 MG tablet Take 5 mg by mouth daily.     Allergies:   Patient has no known allergies.   Social History   Socioeconomic History   Marital status: Married    Spouse name: Not on file   Number of children: Not on file   Years of education: Not on file   Highest education level: Not on file  Occupational History   Not on file  Tobacco Use   Smoking status: Never   Smokeless tobacco: Never  Vaping Use   Vaping Use: Never used  Substance and Sexual Activity   Alcohol use: No   Drug use: No   Sexual activity: Not on file  Other Topics Concern   Not on file  Social History Narrative   Not on file   Social Determinants of Health   Financial Resource Strain: Not on file  Food Insecurity: Not on file  Transportation Needs: Not on file  Physical Activity: Not on file  Stress: Not on file  Social Connections: Not on file     Family History: The patient's family history includes Cancer in his maternal grandmother. There is no history of Heart attack or Stroke.  ROS:   Please see the history of present illness.    All other systems reviewed and are negative.  EKGs/Labs/Other Studies Reviewed:    EKG:  EKG is ordered today.  The ekg ordered today demonstrates sinus bradycardia 53 bpm, ST-T wave abnormality consider lateral ischemia  Recent Labs: No results found for requested labs within last 365 days.  Recent Lipid Panel No results found for: "CHOL", "TRIG", "HDL", "CHOLHDL", "VLDL", "LDLCALC", "LDLDIRECT"   Risk Assessment/Calculations:                Physical Exam:    VS:  BP 120/72   Pulse (!) 53   Ht 5\' 10"  (1.778 m)    Wt 216 lb (98 kg)   BMI 30.99 kg/m     Wt Readings from Last 3 Encounters:  02/23/22 216 lb (98 kg)  01/15/21 211 lb (95.7 kg)  08/12/19 201 lb (91.2 kg)     GEN:  Well nourished, well developed in no acute distress HEENT: Normal NECK: No JVD; No carotid bruits LYMPHATICS: No lymphadenopathy CARDIAC: RRR, normal mechanical A2, no murmur RESPIRATORY:  Clear to auscultation without rales, wheezing or rhonchi  ABDOMEN: Soft, non-tender, non-distended MUSCULOSKELETAL:  No edema; No deformity  SKIN: Warm and dry NEUROLOGIC:  Alert and oriented x 3 PSYCHIATRIC:  Normal affect   ASSESSMENT:    1. Long term (current) use of anticoagulants   2. Essential hypertension  3. Aneurysm of ascending aorta without rupture (HCC)   4. S/P AVR (aortic valve replacement)    PLAN:    In order of problems listed above:  Patient tolerating warfarin well.  Aspirin was discontinued last year per updated valvular heart disease guidelines in patients with mechanical aortic valve prostheses.  Denies bleeding or bruising problems.  Continue current management.  Followed in our anticoagulation clinic. Blood pressure well controlled on carvedilol and lisinopril. Patient status postsurgical repair with no problems. Repeat echocardiogram next year.  I personally reviewed his echo from 2021 which showed normal function of his aortic valve prosthesis.  Reviewed indications for SBE prophylaxis.     Medication Adjustments/Labs and Tests Ordered: Current medicines are reviewed at length with the patient today.  Concerns regarding medicines are outlined above.  No orders of the defined types were placed in this encounter.  No orders of the defined types were placed in this encounter.   There are no Patient Instructions on file for this visit.   Signed, [name], MD  02/23/2022 9:45 AM    [facility]

## 2022-03-11 ENCOUNTER — Ambulatory Visit: Payer: Managed Care, Other (non HMO) | Attending: Cardiovascular Disease

## 2022-03-11 DIAGNOSIS — Z7901 Long term (current) use of anticoagulants: Secondary | ICD-10-CM

## 2022-03-11 DIAGNOSIS — Z952 Presence of prosthetic heart valve: Secondary | ICD-10-CM | POA: Diagnosis not present

## 2022-03-11 LAB — POCT INR: INR: 2.2 (ref 2.0–3.0)

## 2022-03-11 NOTE — Patient Instructions (Signed)
*  DOES NOT NEED A PRINTOUT*  Continue taking Warfarin 1.5 tablets daily except for 1 tablet on Fridays.  Recheck INR in 8 weeks.  Call with any changes [phone].  Keep the same diet you have been eating.  

## 2022-05-06 ENCOUNTER — Ambulatory Visit: Payer: Managed Care, Other (non HMO) | Attending: Cardiovascular Disease

## 2022-05-06 DIAGNOSIS — Z5181 Encounter for therapeutic drug level monitoring: Secondary | ICD-10-CM | POA: Diagnosis not present

## 2022-05-06 DIAGNOSIS — Z7901 Long term (current) use of anticoagulants: Secondary | ICD-10-CM

## 2022-05-06 DIAGNOSIS — Z952 Presence of prosthetic heart valve: Secondary | ICD-10-CM

## 2022-05-06 LAB — POCT INR: INR: 2.9 (ref 2.0–3.0)

## 2022-05-06 NOTE — Patient Instructions (Signed)
*  DOES NOT NEED A PRINTOUT*  Continue taking Warfarin 1.5 tablets daily except for 1 tablet on Fridays.  Recheck INR in 8 weeks.  Call with any changes [phone].  Keep the same diet you have been eating.

## 2022-05-16 ENCOUNTER — Other Ambulatory Visit: Payer: Self-pay | Admitting: Cardiovascular Disease

## 2022-05-16 DIAGNOSIS — I428 Other cardiomyopathies: Secondary | ICD-10-CM

## 2022-05-16 DIAGNOSIS — Z952 Presence of prosthetic heart valve: Secondary | ICD-10-CM

## 2022-07-01 ENCOUNTER — Ambulatory Visit: Payer: Managed Care, Other (non HMO) | Attending: Cardiology | Admitting: *Deleted

## 2022-07-01 DIAGNOSIS — Z952 Presence of prosthetic heart valve: Secondary | ICD-10-CM | POA: Diagnosis not present

## 2022-07-01 DIAGNOSIS — Z7901 Long term (current) use of anticoagulants: Secondary | ICD-10-CM | POA: Diagnosis not present

## 2022-07-01 LAB — POCT INR: POC INR: 2.1

## 2022-07-01 NOTE — Patient Instructions (Signed)
Description   Continue taking Warfarin 1.5 tablets daily except for 1 tablet on Fridays.  Recheck INR in 8 weeks.  Call with any changes [phone].  Keep the same diet you have been eating.

## 2022-08-19 ENCOUNTER — Encounter: Payer: Self-pay | Admitting: Cardiovascular Disease

## 2022-08-19 DIAGNOSIS — Z952 Presence of prosthetic heart valve: Secondary | ICD-10-CM

## 2022-08-19 DIAGNOSIS — I428 Other cardiomyopathies: Secondary | ICD-10-CM

## 2022-08-19 MED ORDER — LISINOPRIL 5 MG PO TABS: 5.0000 mg | ORAL_TABLET | Freq: Every day | ORAL | 1 refills | Status: DC

## 2022-08-19 MED ORDER — CARVEDILOL 12.5 MG PO TABS: ORAL_TABLET | ORAL | 1 refills | Status: DC

## 2022-08-19 NOTE — Telephone Encounter (Signed)
Pt last seen by [name] on 02/23/22:  Blood pressure well controlled on carvedilol and lisinopril.   Will send refills to [facility]. Routing message to anticoag clinic to fulfill the warfarin request.  Pt made aware via myChart.

## 2022-08-26 ENCOUNTER — Ambulatory Visit: Payer: BC Managed Care – PPO | Attending: Cardiovascular Disease

## 2022-08-26 DIAGNOSIS — Z952 Presence of prosthetic heart valve: Secondary | ICD-10-CM | POA: Diagnosis not present

## 2022-08-26 DIAGNOSIS — Z7901 Long term (current) use of anticoagulants: Secondary | ICD-10-CM

## 2022-08-26 LAB — POCT INR: INR: 3.2 — AB (ref 2.0–3.0)

## 2022-08-26 MED ORDER — WARFARIN SODIUM 7.5 MG PO TABS: ORAL_TABLET | ORAL | 1 refills | Status: DC

## 2022-08-26 NOTE — Patient Instructions (Signed)
Description   Eat a serving of greens today and continue taking Warfarin 1.5 tablets daily except for 1 tablet on Fridays.  Recheck INR in 6 weeks.  Call with any changes [phone].  Keep the same diet you have been eating.

## 2022-09-29 DIAGNOSIS — M79672 Pain in left foot: Secondary | ICD-10-CM | POA: Diagnosis not present

## 2022-10-07 ENCOUNTER — Ambulatory Visit: Payer: BC Managed Care – PPO | Attending: Cardiovascular Disease

## 2022-10-07 DIAGNOSIS — Z7901 Long term (current) use of anticoagulants: Secondary | ICD-10-CM

## 2022-10-07 DIAGNOSIS — Z952 Presence of prosthetic heart valve: Secondary | ICD-10-CM | POA: Diagnosis not present

## 2022-10-07 LAB — POCT INR: INR: 3.4 — AB (ref 2.0–3.0)

## 2022-10-07 NOTE — Patient Instructions (Signed)
Description   HOLD today's dose and continue taking Warfarin 1.5 tablets daily except for 1 tablet on Fridays.  Recheck INR in 5 weeks.  Call with any changes [phone].  Keep the same diet you have been eating.

## 2022-10-12 ENCOUNTER — Encounter: Payer: Self-pay | Admitting: Podiatry

## 2022-10-12 ENCOUNTER — Ambulatory Visit (INDEPENDENT_AMBULATORY_CARE_PROVIDER_SITE_OTHER): Payer: BC Managed Care – PPO

## 2022-10-12 ENCOUNTER — Other Ambulatory Visit: Payer: Self-pay | Admitting: Podiatry

## 2022-10-12 ENCOUNTER — Ambulatory Visit (INDEPENDENT_AMBULATORY_CARE_PROVIDER_SITE_OTHER): Payer: BC Managed Care – PPO | Admitting: Podiatry

## 2022-10-12 DIAGNOSIS — M79672 Pain in left foot: Secondary | ICD-10-CM | POA: Diagnosis not present

## 2022-10-12 DIAGNOSIS — M722 Plantar fascial fibromatosis: Secondary | ICD-10-CM

## 2022-10-12 MED ORDER — TRIAMCINOLONE ACETONIDE 10 MG/ML IJ SUSP
10.0000 mg | Freq: Once | INTRAMUSCULAR | Status: AC
  Administered 2022-10-12: 10 mg

## 2022-10-12 NOTE — Progress Notes (Signed)
Subjective:   Patient ID: Jeffrey [name], male   DOB: 57 y.o.   MRN: [id number]   HPI Patient presents stating the left heel has been very sore and he had gout years ago is not sure that this is it and patient has trouble walking on the foot.  Does not smoke likes to be active   Review of Systems  All other systems reviewed and are negative.       Objective:  Physical Exam Vitals and nursing note reviewed.  Constitutional:      Appearance: He is well-developed.  Pulmonary:     Effort: Pulmonary effort is normal.  Musculoskeletal:        General: Normal range of motion.  Skin:    General: Skin is warm.  Neurological:     Mental Status: He is alert.     Neurovascular status intact muscle strength adequate range of motion within normal limits with exquisite discomfort in the medial central band of the plantar fascia left fluid buildup noted pain with pressure     Assessment:  Acute Planter fasciitis left of short-term duration     Plan:  H&P reviewed and discussed x-rays.  At this point I did sterile prep I injected the plantar fascia left 3 mg Kenalog 5 mg Xylocaine instructed on ice therapy reappoint as symptoms indicate may require more aggressive treatment plan  X-rays indicate small spur no indication stress fracture arthritis

## 2022-10-12 NOTE — Patient Instructions (Signed)

## 2022-11-11 ENCOUNTER — Ambulatory Visit: Payer: BC Managed Care – PPO | Attending: Cardiovascular Disease

## 2022-11-11 DIAGNOSIS — Z7901 Long term (current) use of anticoagulants: Secondary | ICD-10-CM

## 2022-11-11 DIAGNOSIS — Z952 Presence of prosthetic heart valve: Secondary | ICD-10-CM | POA: Diagnosis not present

## 2022-11-11 LAB — POCT INR: INR: 2.7 (ref 2.0–3.0)

## 2022-11-11 NOTE — Patient Instructions (Signed)
continue taking Warfarin 1.5 tablets daily except for 1 tablet on Fridays.  Stay consistent with greens (3 per week)  Recheck INR in 8 weeks.  Call with any changes [phone].  Keep the same diet you have been eating.

## 2022-11-25 DIAGNOSIS — Z131 Encounter for screening for diabetes mellitus: Secondary | ICD-10-CM | POA: Diagnosis not present

## 2022-11-25 DIAGNOSIS — Z0000 Encounter for general adult medical examination without abnormal findings: Secondary | ICD-10-CM | POA: Diagnosis not present

## 2022-11-25 DIAGNOSIS — Z13220 Encounter for screening for lipoid disorders: Secondary | ICD-10-CM | POA: Diagnosis not present

## 2023-01-06 ENCOUNTER — Ambulatory Visit: Payer: 59 | Attending: Internal Medicine | Admitting: *Deleted

## 2023-01-06 ENCOUNTER — Ambulatory Visit: Payer: BC Managed Care – PPO | Admitting: Internal Medicine

## 2023-01-06 DIAGNOSIS — Z7901 Long term (current) use of anticoagulants: Secondary | ICD-10-CM | POA: Diagnosis not present

## 2023-01-06 DIAGNOSIS — Z952 Presence of prosthetic heart valve: Secondary | ICD-10-CM

## 2023-01-06 LAB — POCT INR: POC INR: 3.5

## 2023-01-06 NOTE — Patient Instructions (Signed)
Description   Hold warfarin today and then  continue taking Warfarin 1.5 tablets daily except for 1 tablet on Fridays. Stay consistent with greens (3 per week)  Recheck INR in 3 weeks.  Call with any changes [phone].  Keep the same diet you have been eating.

## 2023-01-16 ENCOUNTER — Other Ambulatory Visit: Payer: Self-pay

## 2023-01-16 DIAGNOSIS — Z952 Presence of prosthetic heart valve: Secondary | ICD-10-CM

## 2023-01-16 MED ORDER — WARFARIN SODIUM 7.5 MG PO TABS: ORAL_TABLET | ORAL | 0 refills | Status: DC

## 2023-01-16 NOTE — Telephone Encounter (Signed)
Prescription refill request received for warfarin Lov: 02/23/22 [name])  Next INR check: 01/27/23 Warfarin tablet strength: 7.5mg   Appropriate dose. Refill sent.

## 2023-01-22 ENCOUNTER — Other Ambulatory Visit: Payer: Self-pay | Admitting: Cardiovascular Disease

## 2023-01-22 DIAGNOSIS — Z952 Presence of prosthetic heart valve: Secondary | ICD-10-CM

## 2023-01-22 DIAGNOSIS — I428 Other cardiomyopathies: Secondary | ICD-10-CM

## 2023-01-27 ENCOUNTER — Ambulatory Visit: Payer: 59 | Attending: Cardiovascular Disease

## 2023-01-27 DIAGNOSIS — Z952 Presence of prosthetic heart valve: Secondary | ICD-10-CM | POA: Diagnosis not present

## 2023-01-27 DIAGNOSIS — Z7901 Long term (current) use of anticoagulants: Secondary | ICD-10-CM | POA: Diagnosis not present

## 2023-01-27 LAB — POCT INR: INR: 2.4 (ref 2.0–3.0)

## 2023-01-27 NOTE — Patient Instructions (Signed)
continue taking Warfarin 1.5 tablets daily except for 1 tablet on Fridays. Stay consistent with greens (3 per week)  Recheck INR in 7 weeks.  Call with any changes [phone].  Keep the same diet you have been eating.

## 2023-02-02 ENCOUNTER — Other Ambulatory Visit: Payer: Self-pay

## 2023-02-02 DIAGNOSIS — Z952 Presence of prosthetic heart valve: Secondary | ICD-10-CM

## 2023-02-02 MED ORDER — WARFARIN SODIUM 7.5 MG PO TABS
ORAL_TABLET | ORAL | 0 refills | Status: DC
Start: 2023-02-02 — End: 2023-04-10

## 2023-02-02 NOTE — Telephone Encounter (Signed)
Prescription refill request received for warfarin Lov: 02/23/22 [name])  Next INR check: 03/17/23 Warfarin tablet strength: 7.5mg    Refill just sent on 01/16/23 to [facility]; however, pt states he has new insurance and prescriptions have to be sent to [facility] now. Refill sent.

## 2023-02-07 ENCOUNTER — Other Ambulatory Visit: Payer: Self-pay | Admitting: *Deleted

## 2023-02-07 DIAGNOSIS — Z952 Presence of prosthetic heart valve: Secondary | ICD-10-CM

## 2023-02-07 DIAGNOSIS — I428 Other cardiomyopathies: Secondary | ICD-10-CM

## 2023-02-07 MED ORDER — LISINOPRIL 5 MG PO TABS: 5.0000 mg | ORAL_TABLET | Freq: Every day | ORAL | 0 refills | Status: DC

## 2023-02-07 MED ORDER — CARVEDILOL 12.5 MG PO TABS
ORAL_TABLET | ORAL | 0 refills | Status: DC
Start: 2023-02-07 — End: 2023-04-17

## 2023-02-10 ENCOUNTER — Ambulatory Visit (HOSPITAL_COMMUNITY): Payer: 59 | Attending: Cardiology

## 2023-02-10 DIAGNOSIS — I10 Essential (primary) hypertension: Secondary | ICD-10-CM | POA: Diagnosis present

## 2023-02-10 DIAGNOSIS — I7121 Aneurysm of the ascending aorta, without rupture: Secondary | ICD-10-CM | POA: Insufficient documentation

## 2023-02-10 DIAGNOSIS — Z7901 Long term (current) use of anticoagulants: Secondary | ICD-10-CM | POA: Diagnosis present

## 2023-02-10 DIAGNOSIS — Z952 Presence of prosthetic heart valve: Secondary | ICD-10-CM | POA: Insufficient documentation

## 2023-02-10 LAB — ECHOCARDIOGRAM COMPLETE
AR max vel: 1.5 cm2
AV Area VTI: 1.75 cm2
AV Area mean vel: 1.63 cm2
AV Mean grad: 9.3 mm[Hg]
AV Peak grad: 19.2 mm[Hg]
Ao pk vel: 2.19 m/s
Area-P 1/2: 2.87 cm2
S' Lateral: 3.1 cm

## 2023-02-10 MED ORDER — PERFLUTREN LIPID MICROSPHERE
3.0000 mL | INTRAVENOUS | Status: AC | PRN
Start: 2023-02-10 — End: 2023-02-10
  Administered 2023-02-10: 3 mL via INTRAVENOUS

## 2023-02-24 ENCOUNTER — Encounter: Payer: Self-pay | Admitting: Cardiovascular Disease

## 2023-02-24 ENCOUNTER — Ambulatory Visit: Payer: 59 | Attending: Cardiovascular Disease | Admitting: Cardiovascular Disease

## 2023-02-24 VITALS — BP 116/80 | HR 55 | Resp 16 | Ht 70.0 in | Wt 218.0 lb

## 2023-02-24 DIAGNOSIS — Z952 Presence of prosthetic heart valve: Secondary | ICD-10-CM

## 2023-02-24 DIAGNOSIS — Z7901 Long term (current) use of anticoagulants: Secondary | ICD-10-CM | POA: Diagnosis not present

## 2023-02-24 DIAGNOSIS — E782 Mixed hyperlipidemia: Secondary | ICD-10-CM

## 2023-02-24 DIAGNOSIS — I428 Other cardiomyopathies: Secondary | ICD-10-CM

## 2023-02-24 NOTE — Assessment & Plan Note (Signed)
LVEF normal, he will continue on lisinopril and carvedilol.

## 2023-02-24 NOTE — Patient Instructions (Signed)

## 2023-02-24 NOTE — Progress Notes (Signed)
Cardiology Office Note:    Date:  02/24/2023   ID:  Jeffrey [name], DOB Jun 14, 1965, MRN [id number]  PCP:  Marden Noble, MD (Inactive)   [facility] Providers Cardiologist:  [name], MD     Referring MD: No ref. provider found   Chief Complaint  Patient presents with   S/P AVR (aortic valve replacement) and aortoplasty   Aortic aneurysm without rupture, unspecified portion of aor   Follow-up    1 year    History of Present Illness:    Jeffrey [name] is a 57 y.o. male with a hx of ascending thoracic aortic aneurysm and severe aortic valve insufficiency who underwent Bentall surgery with a Saint Jude 27 mm mechanical valve and a 30 mm Dacron aortic graft in 2015.  He has done well with tolerance of long-term warfarin and has had no bleeding problems.   The patient is here alone today.  He has been doing very well.  He continues to walk about 8-10,000 steps per day with no symptoms associated with this.  He is tolerating warfarin with no bleeding problems.  He denies chest pain, chest pressure, or shortness of breath.  He has no leg swelling or heart palpitations.  He asks about whether he can take an alternative anticoagulant drug but he now understands that because of his mechanical heart valve warfarin is his only option.  Current Medications: Current Meds  Medication Sig   acetaminophen (TYLENOL) 500 MG tablet Take 500 mg by mouth every 6 (six) hours as needed for mild pain or fever.    carvedilol (COREG) 12.5 MG tablet Take 1 tablet by mouth twice daily with meals.   lisinopril (ZESTRIL) 5 MG tablet Take 1 tablet (5 mg total) by mouth daily.   rosuvastatin (CRESTOR) 5 MG tablet Take 5 mg by mouth at bedtime.   warfarin (COUMADIN) 7.5 MG tablet TAKE 1 AND 1/2 TABLETS BY MOUTH DAILY EXCEPT 1 TABLET ON FRIDAYS OR AS DIRECTED BY ANTICOAGULATION CLINIC   zolpidem (AMBIEN) 10 MG tablet Take 5 mg by mouth daily.     Allergies:   Patient has no known  allergies.   ROS:   Please see the history of present illness.    All other systems reviewed and are negative.  EKGs/Labs/Other Studies Reviewed:    The following studies were reviewed today: Cardiac Studies & Procedures       ECHOCARDIOGRAM  ECHOCARDIOGRAM COMPLETE 02/10/2023  Narrative ECHOCARDIOGRAM REPORT    Patient Name:   Jeffrey [name] Date of Exam: 02/10/2023 Medical Rec #:  [id number]          Height:       70.0 in Accession #:    [id number]         Weight:       216.0 lb Date of Birth:  06-18-1965          BSA:          2.157 m Patient Age:    57 years           BP:           120/72 mmHg Patient Gender: M                  HR:           58 bpm. Exam Location:  [address]  Procedure: 2D Echo, Color Doppler, Cardiac Doppler and Intracardiac Opacification Agent  Indications:    Z95.2 S/p AVR  History:  Patient has prior history of Echocardiogram examinations, most recent 08/09/2019. Aneurysm of ascending aorta, S/p AVR (27mm St Jude); Risk Factors:Hypertension. Aortic Valve: 27 mm St. Jude valve is present in the aortic position. Procedure Date: 02/24/14.  Sonographer:    Samule Ohm RDCS Referring Phys: [phone] Fontaine Kossman   Sonographer Comments: Technically difficult study due to poor echo windows. IMPRESSIONS   1. Left ventricular ejection fraction, by estimation, is 55 to 60%. The left ventricle has normal function. The left ventricle has no regional wall motion abnormalities. There is mild left ventricular hypertrophy. Left ventricular diastolic function could not be evaluated. 2. Right ventricular systolic function is normal. The right ventricular size is normal. 3. The mitral valve is normal in structure. No evidence of mitral valve regurgitation. No evidence of mitral stenosis. 4. The aortic valve has been repaired/replaced. Aortic valve regurgitation is trivial. No aortic stenosis is present. There is a 27 mm St. Jude valve present in  the aortic position. Procedure Date: 02/24/14. Echo findings are consistent with normal structure and function of the aortic valve prosthesis.  FINDINGS Left Ventricle: Left ventricular ejection fraction, by estimation, is 55 to 60%. The left ventricle has normal function. The left ventricle has no regional wall motion abnormalities. Definity contrast agent was given IV to delineate the left ventricular endocardial borders. The left ventricular internal cavity size was normal in size. There is mild left ventricular hypertrophy. Left ventricular diastolic function could not be evaluated.  Right Ventricle: The right ventricular size is normal. Right ventricular systolic function is normal.  Left Atrium: Left atrial size was normal in size.  Right Atrium: Right atrial size was normal in size.  Pericardium: There is no evidence of pericardial effusion.  Mitral Valve: The mitral valve is normal in structure. Mild mitral annular calcification. No evidence of mitral valve regurgitation. No evidence of mitral valve stenosis.  Tricuspid Valve: The tricuspid valve is normal in structure. Tricuspid valve regurgitation is trivial. No evidence of tricuspid stenosis.  Aortic Valve: The aortic valve has been repaired/replaced. Aortic valve regurgitation is trivial. No aortic stenosis is present. Aortic valve mean gradient measures 9.3 mmHg. Aortic valve peak gradient measures 19.2 mmHg. Aortic valve area, by VTI measures 1.75 cm. There is a 27 mm St. Jude valve present in the aortic position. Procedure Date: 02/24/14. Echo findings are consistent with normal structure and function of the aortic valve prosthesis.  Pulmonic Valve: The pulmonic valve was normal in structure. Pulmonic valve regurgitation is trivial. No evidence of pulmonic stenosis.  Aorta: The aortic root is normal in size and structure.  Venous: The inferior vena cava was not well visualized.  IAS/Shunts: The interatrial septum is  aneurysmal. The interatrial septum was not well visualized.   LEFT VENTRICLE PLAX 2D LVIDd:         5.00 cm LVIDs:         3.10 cm LV PW:         1.20 cm LV IVS:        1.40 cm LVOT diam:     2.40 cm LV SV:         78 LV SV Index:   36 LVOT Area:     4.52 cm   RIGHT VENTRICLE TAPSE (M-mode): 1.1 cm RVSP:           24.3 mmHg  LEFT ATRIUM             Index        RIGHT ATRIUM  Index LA diam:        4.20 cm 1.95 cm/m   RA Pressure: 3.00 mmHg LA Vol (A2C):   54.4 ml 25.22 ml/m  RA Area:     21.80 cm LA Vol (A4C):   51.2 ml 23.74 ml/m  RA Volume:   66.20 ml  30.69 ml/m LA Biplane Vol: 57.1 ml 26.48 ml/m AORTIC VALVE AV Area (Vmax):    1.50 cm AV Area (Vmean):   1.63 cm AV Area (VTI):     1.75 cm AV Vmax:           219.33 cm/s AV Vmean:          141.667 cm/s AV VTI:            0.447 m AV Peak Grad:      19.2 mmHg AV Mean Grad:      9.3 mmHg LVOT Vmax:         72.70 cm/s LVOT Vmean:        51.000 cm/s LVOT VTI:          0.173 m LVOT/AV VTI ratio: 0.39  AORTA Ao Root diam: 3.60 cm Ao Asc diam:  3.40 cm  MITRAL VALVE                TRICUSPID VALVE MV Area (PHT): 2.87 cm     TR Peak grad:   21.3 mmHg MV Decel Time: 264 msec     TR Vmax:        231.00 cm/s MV E velocity: 99.00 cm/s   Estimated RAP:  3.00 mmHg MV A velocity: 107.00 cm/s  RVSP:           24.3 mmHg MV E/A ratio:  0.93 SHUNTS Systemic VTI:  0.17 m Systemic Diam: 2.40 cm  Olga Millers MD Electronically signed by Olga Millers MD Signature Date/Time: 02/10/2023/1:10:18 PM    Final             EKG:        Recent Labs: No results found for requested labs within last 365 days.  Recent Lipid Panel No results found for: "CHOL", "TRIG", "HDL", "CHOLHDL", "VLDL", "LDLCALC", "LDLDIRECT"   Risk Assessment/Calculations:                Physical Exam:    VS:  BP 116/80 (BP Location: Left Arm, Patient Position: Sitting, Cuff Size: Normal)   Pulse (!) 55   Resp 16   Ht 5\' 10"   (1.778 m)   Wt 218 lb (98.9 kg)   SpO2 97%   BMI 31.28 kg/m     Wt Readings from Last 3 Encounters:  02/24/23 218 lb (98.9 kg)  02/23/22 216 lb (98 kg)  01/15/21 211 lb (95.7 kg)     GEN:  Well nourished, well developed in no acute distress HEENT: Normal NECK: No JVD; No carotid bruits LYMPHATICS: No lymphadenopathy CARDIAC: RRR, normal mechanical A2 RESPIRATORY:  Clear to auscultation without rales, wheezing or rhonchi  ABDOMEN: Soft, non-tender, non-distended MUSCULOSKELETAL:  No edema; No deformity  SKIN: Warm and dry NEUROLOGIC:  Alert and oriented x 3 PSYCHIATRIC:  Normal affect   Assessment & Plan S/P AVR (aortic valve replacement) and aortoplasty The patient continues to do well and is tolerating medical therapy with warfarin.  His echocardiogram is reviewed and shows normal function of his mechanical aortic prosthesis.  The patient's mean transaortic gradient is 9 mmHg and there is no valvular insufficiency.  Continue current management.  Reviewed the need  for continued warfarin, he understands and is very compliant with his medication. Long term (current) use of anticoagulants Continue warfarin NICM (nonischemic cardiomyopathy) (HCC) LVEF normal, he will continue on lisinopril and carvedilol. Mixed hyperlipidemia Treated with rosuvastatin.  Cholesterol is 123, LDL 44.      Medication Adjustments/Labs and Tests Ordered: Current medicines are reviewed at length with the patient today.  Concerns regarding medicines are outlined above.  Orders Placed This Encounter  Procedures   EKG 12-Lead   No orders of the defined types were placed in this encounter.   There are no Patient Instructions on file for this visit.   Signed, [name], MD  02/24/2023 10:39 AM    [facility]

## 2023-02-24 NOTE — Assessment & Plan Note (Signed)
The patient continues to do well and is tolerating medical therapy with warfarin.  His echocardiogram is reviewed and shows normal function of his mechanical aortic prosthesis.  The patient's mean transaortic gradient is 9 mmHg and there is no valvular insufficiency.  Continue current management.  Reviewed the need for continued warfarin, he understands and is very compliant with his medication.

## 2023-02-24 NOTE — Assessment & Plan Note (Signed)
Continue warfarin.

## 2023-03-17 ENCOUNTER — Ambulatory Visit: Payer: 59 | Attending: Cardiology

## 2023-03-17 DIAGNOSIS — Z7901 Long term (current) use of anticoagulants: Secondary | ICD-10-CM | POA: Diagnosis not present

## 2023-03-17 DIAGNOSIS — Z952 Presence of prosthetic heart valve: Secondary | ICD-10-CM

## 2023-03-17 LAB — POCT INR: INR: 2.8 (ref 2.0–3.0)

## 2023-03-17 NOTE — Patient Instructions (Signed)
continue taking Warfarin 1.5 tablets daily except for 1 tablet on Fridays.  Stay consistent with greens (3 per week)  Recheck INR in 8 weeks.  Call with any changes [phone].  Keep the same diet you have been eating.

## 2023-04-10 ENCOUNTER — Other Ambulatory Visit: Payer: Self-pay | Admitting: Cardiovascular Disease

## 2023-04-10 DIAGNOSIS — Z952 Presence of prosthetic heart valve: Secondary | ICD-10-CM

## 2023-04-15 ENCOUNTER — Other Ambulatory Visit: Payer: Self-pay | Admitting: Cardiovascular Disease

## 2023-04-15 DIAGNOSIS — Z952 Presence of prosthetic heart valve: Secondary | ICD-10-CM

## 2023-04-15 DIAGNOSIS — I428 Other cardiomyopathies: Secondary | ICD-10-CM

## 2023-05-04 ENCOUNTER — Encounter: Payer: Self-pay | Admitting: Cardiovascular Disease

## 2023-05-08 ENCOUNTER — Ambulatory Visit: Payer: 59 | Attending: Cardiovascular Disease

## 2023-05-08 DIAGNOSIS — Z7901 Long term (current) use of anticoagulants: Secondary | ICD-10-CM

## 2023-05-08 DIAGNOSIS — Z952 Presence of prosthetic heart valve: Secondary | ICD-10-CM

## 2023-05-08 LAB — POCT INR: INR: 3.3 — AB (ref 2.0–3.0)

## 2023-05-08 NOTE — Patient Instructions (Signed)
continue taking Warfarin 1.5 tablets daily except for 1 tablet on Fridays. Stay consistent with greens (3 per week) Eat greens tonight. Recheck INR in 8 weeks.  Call with any changes [phone].  Keep the same diet you have been eating.

## 2023-05-12 ENCOUNTER — Ambulatory Visit: Payer: 59

## 2023-06-22 ENCOUNTER — Encounter: Payer: Self-pay | Admitting: Cardiovascular Disease

## 2023-07-07 ENCOUNTER — Ambulatory Visit: Payer: 59 | Attending: Cardiovascular Disease | Admitting: *Deleted

## 2023-07-07 DIAGNOSIS — Z7901 Long term (current) use of anticoagulants: Secondary | ICD-10-CM

## 2023-07-07 DIAGNOSIS — Z952 Presence of prosthetic heart valve: Secondary | ICD-10-CM

## 2023-07-07 LAB — POCT INR: INR: 4.3 — AB (ref 2.0–3.0)

## 2023-07-07 NOTE — Patient Instructions (Signed)
 Description   Do not take any warfarin today and no warfarin tomorrow then continue taking Warfarin 1.5 tablets daily except for 1 tablet on Fridays. Stay consistent with greens (3 per week) Eat greens tonight and stay consistent.  Recheck INR in 2 weeks (normally 8 weeks).  Call with any changes [phone] or [phone].  Keep the same diet you have been eating.

## 2023-07-21 ENCOUNTER — Ambulatory Visit: Attending: Cardiovascular Disease

## 2023-07-21 DIAGNOSIS — Z7901 Long term (current) use of anticoagulants: Secondary | ICD-10-CM

## 2023-07-21 DIAGNOSIS — Z952 Presence of prosthetic heart valve: Secondary | ICD-10-CM

## 2023-07-21 LAB — POCT INR: INR: 2.9 (ref 2.0–3.0)

## 2023-07-21 NOTE — Patient Instructions (Signed)
 continue taking Warfarin 1.5 tablets daily except for 1 tablet on Fridays. Stay consistent with greens (3 per week) Eat greens tonight and stay consistent.  Recheck INR in 8 weeks (normally 8 weeks).  Call with any changes [phone] or [phone].  Keep the same diet you have been eating.

## 2023-09-15 ENCOUNTER — Ambulatory Visit: Attending: Cardiovascular Disease | Admitting: *Deleted

## 2023-09-15 DIAGNOSIS — Z952 Presence of prosthetic heart valve: Secondary | ICD-10-CM

## 2023-09-15 DIAGNOSIS — Z7901 Long term (current) use of anticoagulants: Secondary | ICD-10-CM | POA: Diagnosis not present

## 2023-09-15 LAB — POCT INR: INR: 2.4 (ref 2.0–3.0)

## 2023-09-15 NOTE — Patient Instructions (Signed)
 Description   Continue taking Warfarin 1.5 tablets daily except for 1 tablet on Fridays. Stay consistent with greens (3 per week)Recheck INR in 7 weeks (normally 8 weeks).  Call with any changes [phone] or [phone].  Keep the same diet you have been eating.

## 2023-11-03 ENCOUNTER — Ambulatory Visit: Attending: Cardiovascular Disease

## 2023-11-03 DIAGNOSIS — Z7901 Long term (current) use of anticoagulants: Secondary | ICD-10-CM | POA: Diagnosis not present

## 2023-11-03 DIAGNOSIS — Z952 Presence of prosthetic heart valve: Secondary | ICD-10-CM | POA: Diagnosis not present

## 2023-11-03 LAB — POCT INR: INR: 2 (ref 2.0–3.0)

## 2023-11-03 NOTE — Patient Instructions (Signed)
 NO PRINTOUT NEEDED. Continue taking Warfarin 1.5 tablets daily except for 1 tablet on Fridays. Stay consistent with greens (3 per week)Recheck INR in 8 weeks (normally 8 weeks).  Call with any changes [phone] or [phone].  Keep the same diet you have been eating.

## 2023-11-03 NOTE — Progress Notes (Signed)
 INR 2.0 Please see anticoagulation encounter.

## 2023-12-14 ENCOUNTER — Ambulatory Visit: Admitting: Internal Medicine

## 2023-12-14 ENCOUNTER — Encounter: Payer: Self-pay | Admitting: Internal Medicine

## 2023-12-14 VITALS — BP 136/72 | HR 66 | Temp 98.0°F | Ht 70.0 in | Wt 214.8 lb

## 2023-12-14 DIAGNOSIS — L0591 Pilonidal cyst without abscess: Secondary | ICD-10-CM | POA: Insufficient documentation

## 2023-12-14 DIAGNOSIS — I428 Other cardiomyopathies: Secondary | ICD-10-CM | POA: Diagnosis not present

## 2023-12-14 DIAGNOSIS — M25519 Pain in unspecified shoulder: Secondary | ICD-10-CM | POA: Insufficient documentation

## 2023-12-14 DIAGNOSIS — Z952 Presence of prosthetic heart valve: Secondary | ICD-10-CM

## 2023-12-14 DIAGNOSIS — E119 Type 2 diabetes mellitus without complications: Secondary | ICD-10-CM

## 2023-12-14 DIAGNOSIS — G4709 Other insomnia: Secondary | ICD-10-CM | POA: Diagnosis not present

## 2023-12-14 DIAGNOSIS — Z6830 Body mass index (BMI) 30.0-30.9, adult: Secondary | ICD-10-CM

## 2023-12-14 DIAGNOSIS — G4700 Insomnia, unspecified: Secondary | ICD-10-CM | POA: Insufficient documentation

## 2023-12-14 DIAGNOSIS — E785 Hyperlipidemia, unspecified: Secondary | ICD-10-CM | POA: Insufficient documentation

## 2023-12-14 DIAGNOSIS — E66811 Obesity, class 1: Secondary | ICD-10-CM

## 2023-12-14 DIAGNOSIS — Z0001 Encounter for general adult medical examination with abnormal findings: Secondary | ICD-10-CM

## 2023-12-14 DIAGNOSIS — E6609 Other obesity due to excess calories: Secondary | ICD-10-CM

## 2023-12-14 DIAGNOSIS — Z23 Encounter for immunization: Secondary | ICD-10-CM | POA: Diagnosis not present

## 2023-12-14 DIAGNOSIS — M1990 Unspecified osteoarthritis, unspecified site: Secondary | ICD-10-CM

## 2023-12-14 DIAGNOSIS — E782 Mixed hyperlipidemia: Secondary | ICD-10-CM

## 2023-12-14 DIAGNOSIS — R7303 Prediabetes: Secondary | ICD-10-CM | POA: Insufficient documentation

## 2023-12-14 DIAGNOSIS — E781 Pure hyperglyceridemia: Secondary | ICD-10-CM | POA: Insufficient documentation

## 2023-12-14 MED ORDER — CARVEDILOL 12.5 MG PO TABS: 12.5000 mg | ORAL_TABLET | Freq: Two times a day (BID) | ORAL | 3 refills | Status: DC

## 2023-12-14 MED ORDER — ACETAMINOPHEN 500 MG PO TABS: 500.0000 mg | ORAL_TABLET | Freq: Four times a day (QID) | ORAL | 4 refills | Status: AC | PRN

## 2023-12-14 MED ORDER — ZOLPIDEM TARTRATE 10 MG PO TABS: 5.0000 mg | ORAL_TABLET | Freq: Every day | ORAL | 1 refills | Status: AC

## 2023-12-14 MED ORDER — LISINOPRIL 5 MG PO TABS: 5.0000 mg | ORAL_TABLET | Freq: Every day | ORAL | 3 refills | Status: DC

## 2023-12-14 NOTE — Assessment & Plan Note (Signed)
 Mixed hyperlipidemia was previously managed with statins, which were stopped a month ago. Reassess the lipid profile with upcoming blood tests.

## 2023-12-14 NOTE — Progress Notes (Signed)
 [facility] at [facility] [address] [place], [place] [address] Office:  [phone]  -- Annual Preventive Medical Office Visit --  Patient:  Jeffrey [name]      Age: 58 y.o.       Sex:  male  Date:   12/14/2023 Patient Care Team: Jesus Bernardino MATSU, MD as PCP - General (Internal Medicine) Wonda Sharper, MD as PCP - Cardiology (Cardiology) Today's Healthcare Provider: Bernardino MATSU Jesus, MD  ========================================= Chief complaint: New Pt (Pt is present to est care with pcp pt see heart dr with [facility] in November coming up )  Purpose of Visit: Comprehensive preventive health assessment and personalized health maintenance planning.  This encounter was conducted as a Comprehensive Physical Exam (CPE) preventive care annual visit. The patient's medical history and problem list were reviewed to inform individualized preventive care recommendations.   Assessment & Plan Encounter for annual general medical examination with abnormal findings in adult During the annual wellness visit, no acute issues were identified, but ear wax buildup was noted. Perform ear irrigation to remove the wax. Conduct blood tests including diabetic lab work, blood count, metabolic panel, cholesterol, A1c, and urine protein. Check thyroid function and consider a vitamin D check if levels were previously low. Mixed hyperlipidemia Mixed hyperlipidemia was previously managed with statins, which were stopped a month ago. Reassess the lipid profile with upcoming blood tests. Immunization due Flu shot given S/P AVR (aortic valve replacement) and aortoplasty Presence of mechanical prosthetic heart valve   He has had a mechanical prosthetic heart valve since 2015 with an INR target range of 2-3. The last echocardiogram showed no valve issues. Continue the current warfarin regimen and follow up with the INR clinic on September 19. Avoid grapefruit, grapefruit juice, and alcohol due to  warfarin interaction. NICM (nonischemic cardiomyopathy) (HCC) ? Diagnosis- presumably due to now repaired to valvular disease. Other insomnia Other insomnia   Insomnia is managed with Ambien , taken approximately 10 times per month. Discussed risks of long-term use, including a potential link to dementia. Prescribe Ambien  with 30 tablets and a couple of refills, aiming for use to last the year. Encourage minimizing use of Ambien . Arthritis Osteoarthritis causes stiffness, especially with prolonged sitting, and is managed with Tylenol  as needed. Continue using Tylenol  as needed for joint pain. Class 1 obesity due to excess calories with serious comorbidity and body mass index (BMI) of 30.0 to 30.9 in adult Obesity, class 1   With a BMI of 30, he is classified as class 1 obesity. He prefers dietary management over medication and follows a keto diet with intermittent fasting. Continue current dietary management. Type 2 diabetes mellitus without complication, without long-term current use of insulin  (HCC) Possibly prediabetes only- but listed as diabetes mellitus 2. Type 2 diabetes is managed with diet, with the last A1c at 5.8. He is not on diabetes medication and follows a meatless keto diet. Monitor A1c with upcoming blood tests and continue dietary management.    ICD-10-CM   1. Mixed hyperlipidemia  E78.2     2. Immunization due  Z23 Flu vaccine trivalent PF, 6mos and older(Flulaval,Afluria,Fluarix,Fluzone)    3. S/P AVR (aortic valve replacement) and aortoplasty  Z95.2 carvedilol  (COREG ) 12.5 MG tablet    4. NICM (nonischemic cardiomyopathy) (HCC)  I42.8 lisinopril  (ZESTRIL ) 5 MG tablet    5. Other insomnia  G47.09 zolpidem  (AMBIEN ) 10 MG tablet    6. Arthritis  M19.90 acetaminophen  (TYLENOL ) 500 MG tablet    7. Class  1 obesity due to excess calories with serious comorbidity and body mass index (BMI) of 30.0 to 30.9 in adult  E66.811 TSH Rfx on Abnormal to Free T4   E66.09    Z68.30      8. Type 2 diabetes mellitus without complication, without long-term current use of insulin  (HCC)  E11.9 CBC with Differential/Platelet    Comprehensive metabolic panel with GFR    Lipid panel    Hemoglobin A1c    Microalbumin / creatinine urine ratio       Reviewed/updated/encouraged completion:  we are still missing some records we waiting on from [facility] Immunization History  Administered Date(s) Administered   Influenza, Seasonal, Injecte, Preservative Fre 12/27/2022, 12/14/2023   Influenza,inj,Quad PF,6+ Mos 03/24/2014, 01/05/2016, 12/29/2016, 12/15/2017   Moderna Sars-Covid-2 Vaccination 05/25/2019, 05/27/2019   PFIZER Comirnaty(Gray Top)Covid-19 Tri-Sucrose Vaccine 03/04/2022   Pneumococcal Conjugate-13 07/17/2015   Pneumococcal Polysaccharide-23 04/16/2014   Tdap 04/12/2010    Health Maintenance  Topic Date Due   FOOT EXAM  Never done   OPHTHALMOLOGY EXAM  Never done   HIV Screening  Never done   Diabetic kidney evaluation - Urine ACR  Never done   Hepatitis C Screening  Never done   Hepatitis B Vaccines 19-59 Average Risk (1 of 3 - 19+ 3-dose series) Never done   Zoster Vaccines- Shingrix (1 of 2) Never done   Colonoscopy  Never done   HEMOGLOBIN A1C  08/19/2014   DTaP/Tdap/Td (2 - Td or Tdap) 04/12/2020   Pneumococcal Vaccine: 50+ Years (3 of 3 - PCV20 or PCV21) 07/16/2020   Diabetic kidney evaluation - eGFR measurement  01/06/2022   COVID-19 Vaccine (4 - 2025-26 season) 12/30/2023 (Originally 12/11/2023)   Influenza Vaccine  Completed   HPV VACCINES  Aged Out   Meningococcal B Vaccine  Aged Out    Reviewed the following verbally with patient and provided AVS materials:   HEALTH MAINTENANCE COUNSELING AND ANTICIPATORY GUIDANCE    Preventive Measure Recommendation  Eye Exams Every 1-2 years  Dental Care Cleanings every 6 months or more, brush/floss 3x daily  Sinus Care Saline spray rinses daily  Sleep 8 hours nightly, good sleep hygiene, e-monitoring  if any daytime drowsiness  Diet Fruits/vegetables/fiber/healthy fats, balance and moderation  Exercise 150 minutes weekly  Risk Behaviors Discouraged any/all high risk behaviors    CANCER SCREENING SHARED DECISION MAKING    Penile/Testicle/Scrotum Encouraged self-monitoring and reporting of genital abnormalities. Patient reports none.  Thyroid Checked and advised to palpate thyroid for nodules  Prostate Individualized risks/benefits/costs discussed No results found for: PSA  Colon HM Colonoscopy         UpToDate but not yet on file    Lung Current guidelines recommend individuals aged 24 to 79 who currently smoke or formerly smoked and have a >= 20 pack-year smoking history should undergo annual screening with low-dose computed tomography (LDCT).  Social History   Tobacco Use  Smoking Status Never  Smokeless Tobacco Never    Skin Advised regular sunscreen use. Patient denies worrisome, changing, or new skin lesions. Offered to include images in chart for surveillance. Showed patient these pictures of melanomas for reference to educate for self-monitoring.  Other Cancers Discussed lack of screening guidelines and insurance coverage for other cancer types.    Discussed the use of AI scribe software for clinical note transcription with the patient, who gave verbal consent to proceed.  History of Present Illness Thomos Woolbright Anselm is a 58 year old male with a  history of mechanical heart valve replacement who presents for a routine follow-up and medication review.  He had a mechanical heart valve replacement in 2015 and has been under regular cardiology follow-up since then. He sometimes hears the valve, especially in quiet environments, describing it as 'louder than most'. He monitors his INR levels closely, aiming for a target of 2.5, and repeats testing if levels fall outside the 2-3 range as directed by his providers. He is currently taking carvedilol  12 mg twice daily,  lisinopril , and warfarin, with a recent INR clinic appointment scheduled for the 19th.  He has a history of type 2 diabetes, previously managed as prediabetes, and is currently controlling his blood sugar levels through a keto diet. His last A1c was 5.8, and he has not been on diabetes medication for the past ten years. He follows a meatless keto diet, focusing on vegetables, eggs, and nuts, and avoids white bread.  He experiences joint pain, which he manages with over-the-counter Tylenol , taken two to three times a week. He also uses Ambien  occasionally, about ten times a month, to manage insomnia, and notes that Tylenol  sometimes helps him sleep, reducing his need for Ambien .  He has a history of high cholesterol and was on statins until a month ago. He is awaiting current lab results to reassess his cholesterol management strategy.  No current symptoms of abdominal bloating, cough, diarrhea, facial cuts, fevers, or gout attacks. He denies any visual problems since 2019 and has no current issues with a pilonidal cyst or shoulder pain.  He is planning a trip to [place] in two weeks to visit his 4 year old father, whom he has not seen in 22 years. He is ensuring all necessary precautions are in place for his travel.    ROS A comprehensive ROS was negative for any concerning symptoms.   Completed medication reconciliation: Current Outpatient Medications on File Prior to Visit  Medication Sig   rosuvastatin (CRESTOR) 5 MG tablet Take 5 mg by mouth at bedtime.   warfarin (COUMADIN ) 7.5 MG tablet TAKE 1 AND 1/2 TABLETS BY MOUTH  DAILY ; EXCEPT 1 TABLET DAILY ON FRIDAYS OR AS DIRECTED BY  ANTICOAGULATION CLINIC   No current facility-administered medications on file prior to visit.   Medications Discontinued During This Encounter  Medication Reason   acetaminophen  (TYLENOL ) 500 MG tablet Reorder   zolpidem  (AMBIEN ) 10 MG tablet Reorder   lisinopril  (ZESTRIL ) 5 MG tablet Reorder   carvedilol   (COREG ) 12.5 MG tablet Reorder  The following were reviewed and/or entered/updated into our electronic MEDICAL RECORD NUMBERPast Medical History:  Diagnosis Date   AI (aortic insufficiency)    Aortic insufficiency    a. 02/2014 - admx with sx severe AI and assoc ascending aortic aneurysm (6.8 cm) >> s/p Bentall with mechancial AVR (Dr. Fleeta Ochoa)   Aortic regurgitation 02/20/2014   Dyspnea 02/18/2014   Hx of cardiac catheterization    a. LHC (11/15):  EF 55%, marked ascending aortic dilatation with severe AI on aortic root angiography, normal coronary arteries   Hx of echocardiogram    a. Echo 11/15: EF 55%, severe AI, marked dilation of ascending aorta; b. Echo 12/15: EF 30-35%, large effusion; c. Echo 2/16: EF 40-45%, no RWMA, mechanical AVR ok, trivial effusion post to heart    NICM (nonischemic cardiomyopathy) (HCC)    a. TEE (11/15): Mild LVH, EF 45-50%, diffuse HK, prominent apical trabeculations, severe AI, severely dilated ascending aorta (6.9 cm), mild MR;  b. EF 30-35% post AVR >>  c. EF 40-45% by echo 05/2014   Pericardial effusion    large pericardial effusion post Bentall procedure >> s/p pericardial window   Pilonidal cyst 12/14/2023   Presence of prosthetic heart valve 12/14/2023   Severe aortic insufficiency 02/24/2014   Shoulder joint pain 12/14/2023   Thoracic ascending aortic aneurysm (HCC)    Echo 11/15: severe AI, severely dilated ascending aorta (6.5 cm);  s/p Bentall 02/2014 with aortic root replacement using a mechanical valve-conduit and reimplantation of coronary arteries (St. Jude 27 mm valve), replacement of ascending aorta to the proximal arch with a 30 mm Dacron graft   Type 2 diabetes mellitus without complications (HCC) 12/14/2023   Past Surgical History:  Procedure Laterality Date   BENTALL PROCEDURE N/A 02/24/2014   Procedure: BENTALL PROCEDURE;  Surgeon: Maude Fleeta Ochoa, MD;  Location: [facility] OR;  Service: Open Heart Surgery;  Laterality: N/A;   INTRAOPERATIVE  TRANSESOPHAGEAL ECHOCARDIOGRAM N/A 02/24/2014   Procedure: INTRAOPERATIVE TRANSESOPHAGEAL ECHOCARDIOGRAM;  Surgeon: Maude Fleeta Ochoa, MD;  Location: [facility] OR;  Service: Open Heart Surgery;  Laterality: N/A;   LEFT AND RIGHT HEART CATHETERIZATION WITH CORONARY ANGIOGRAM N/A 02/20/2014   Procedure: LEFT AND RIGHT HEART CATHETERIZATION WITH CORONARY ANGIOGRAM;  Surgeon: Ozell JONETTA Fell, MD;  Location: [facility] CATH LAB;  Service: Cardiovascular;  Laterality: N/A;   SUBXYPHOID PERICARDIAL WINDOW N/A 04/02/2014   Procedure: SUBXYPHOID PERICARDIAL WINDOW;  Surgeon: Maude Fleeta Ochoa, MD;  Location: [facility] OR;  Service: Thoracic;  Laterality: N/A;   TEE WITHOUT CARDIOVERSION N/A 02/20/2014   Procedure: TRANSESOPHAGEAL ECHOCARDIOGRAM (TEE);  Surgeon: Leim VEAR Moose, MD;  Location: [facility] ENDOSCOPY;  Service: Cardiovascular;  Laterality: N/A;   TEE WITHOUT CARDIOVERSION N/A 04/02/2014   Procedure: TRANSESOPHAGEAL ECHOCARDIOGRAM (TEE);  Surgeon: Maude Fleeta Ochoa, MD;  Location: [facility] OR;  Service: Thoracic;  Laterality: N/A;   Social History   Socioeconomic History   Marital status: Married    Spouse name: Not on file   Number of children: Not on file   Years of education: Not on file   Highest education level: Master's degree (e.g., MA, MS, MEng, MEd, MSW, MBA)  Occupational History   Not on file  Tobacco Use   Smoking status: Never   Smokeless tobacco: Never  Vaping Use   Vaping status: Never Used  Substance and Sexual Activity   Alcohol use: No   Drug use: No   Sexual activity: Not on file  Other Topics Concern   Not on file  Social History Narrative   Not on file   Social Drivers of Health   Financial Resource Strain: Low Risk  (12/11/2023)   Overall Financial Resource Strain (CARDIA)    Difficulty of Paying Living Expenses: Not very hard  Food Insecurity: No Food Insecurity (12/11/2023)   Hunger Vital Sign    Worried About Running Out of Food in the Last Year: Never true    Ran Out of Food in the Last  Year: Never true  Transportation Needs: No Transportation Needs (12/11/2023)   PRAPARE - Administrator, Civil Service (Medical): No    Lack of Transportation (Non-Medical): No  Physical Activity: Sufficiently Active (12/11/2023)   Exercise Vital Sign    Days of Exercise per Week: 7 days    Minutes of Exercise per Session: 60 min  Stress: Stress Concern Present (12/11/2023)   [employer] of Occupational Health - Occupational Stress Questionnaire    Feeling of Stress: To some extent  Social Connections: Moderately Integrated (12/11/2023)   Social  Connection and Isolation Panel    Frequency of Communication with Friends and Family: More than three times a week    Frequency of Social Gatherings with Friends and Family: Once a week    Attends Religious Services: 1 to 4 times per year    Active Member of [employer] or Organizations: No    Attends Banker Meetings: Not on file    Marital Status: Married  Catering manager Violence: Not At Risk (03/24/2022)   Received from [facility]    Threatened: Not on file    Insulted: Not on file    Physically Hurt : Not on file    Scream: Not on file      12/11/2023   10:23 PM  Alcohol Use Disorder Test (AUDIT)  1. How often do you have a drink containing alcohol? 1  2. How many drinks containing alcohol do you have on a typical day when you are drinking? 0  3. How often do you have six or more drinks on one occasion? 0  AUDIT-C Score 1      Patient-reported   Family History  Problem Relation Age of Onset   Cancer Maternal Grandmother    Heart attack Neg Hx    Stroke Neg Hx   No Known Allergies Social History   Substance and Sexual Activity  Sexual Activity Not on file  @    12/14/2023    2:05 PM  Depression screen PHQ 2/9  Decreased Interest 0  Down, Depressed, Hopeless 0  PHQ - 2 Score 0       No data to display           BP 136/72   Pulse 66   Temp 98 F (36.7 C) (Temporal)   Ht 5' 10  (1.778 m)   Wt 214 lb 12.8 oz (97.4 kg)   SpO2 98%   BMI 30.82 kg/m  BP Readings from Last 3 Encounters:  12/14/23 136/72  02/24/23 116/80  02/23/22 120/72   Wt Readings from Last 10 Encounters:  12/14/23 214 lb 12.8 oz (97.4 kg)  02/24/23 218 lb (98.9 kg)  02/23/22 216 lb (98 kg)  01/15/21 211 lb (95.7 kg)  08/12/19 201 lb (91.2 kg)  08/02/18 192 lb (87.1 kg)  06/19/17 223 lb 1.9 oz (101.2 kg)  07/20/16 226 lb 1.9 oz (102.6 kg)  12/24/15 221 lb (100.2 kg)  07/06/15 225 lb 12.8 oz (102.4 kg)  Physical Exam  Physical Exam MEASUREMENTS: BMI- 30.0. HEENT: Cerumen impaction in both ears, irrigated.  PROCEDURE: CERUMEN DISIMPACTION, performed by medical assistant.   Otoscopic viewing of the tympanic membrane was initially obstructed by copious impacted cerumen in the external auditory canal, so disimpaction by irrigation was recommended.  The associated and risk of tympanic membrane perforation was discussed and verbal consent was obtained prior to performing the procedure. The affected bilateral auditory canal(s) were then irrigated by gentle ear lavage with successful removal of impacted cerumen as confirmed on post procedural repeat otoscopy.     Patient tolerated well without complications.    After the procedure, the patient reported: some relief  GEN: No acute distress, resting comfortably. Truncal adiposity  HEENT: Tympanic membranes normal appearing bilaterally, oropharynx clear, no thyromegaly noted, no palpable lymphadenopathy or thyroid nodules. CARDIOVASCULAR: S1 and S2 heart sounds with regular rate and rhythm, no murmurs appreciated. PULMONARY: Normal work of breathing, clear to auscultation bilaterally, no crackles, wheezes, or rhonchi. ABDOMEN: Soft, nontender, nondistended. MSK: No edema,  cyanosis, or clubbing noted. SKIN: Warm, dry, no lesions of concern observed. NEUROLOGICAL: Cranial nerves II-XII grossly intact, strength 5/5 in upper and lower extremities,  reflexes symmetric and intact bilaterally. PSYCH: Normal affect and thought content, pleasant and cooperative.      ======================================  IMPORTANT HEALTH REMINDERS: Report any new or changing skin lesions promptly Maintain recommended screening schedules Discuss any new family history of cancer at future visits Follow up on any new symptoms that persist more than two weeks      Notes:  This document was synthesized by artificial intelligence (Abridge) using HIPAA-compliant recording of the clinical interaction;   We discussed the use of AI scribe software for clinical note transcription with the patient, who gave verbal consent to proceed.    This encounter employed state-of-the-art, real-time, collaborative documentation. The patient was empowered to actively review and assist in updating their electronic medical record on a shared monitor, ensuring transparency and improving accuracy.    Prior to and at the beginning of Comprehensive Physical Exam (CPE) preventive care annual visit appointment types  we clarify to patients Our goal today is to focus on your preventive or annual Comprehensive Physical Exam (CPE) preventive care annual visit, which typically covers routine screenings and overall health maintenance. However, if you share any new or concerning symptoms--such as dizziness, passing out, severe pain, or anything else that may point to a more serious issue--we are both legally and ethically required to evaluate it. We cannot simply overlook or ignore such concerns, even if you later decide you don't want to discuss them, because it could jeopardize your health.  If addressing a new concern takes us  beyond the scope of the preventive visit, we may need to bill separately for that portion of care. We understand financial considerations are important, and we're happy to discuss your options if something new comes up. However, we want to be clear that once you  mention a potentially serious issue, we must investigate it; we can't ethically or legally exclude that from our records or our evaluation. Please let us  know all of your questions or worries. Together, we can decide how best to manage them and how to minimize any unexpected costs, but we want to keep you safe above all else.   This disclosure is mandated by professional ethics and legal obligations, as healthcare providers must address any substantial health concerns raised during any patient interaction and a comprehensive ROS is required by insurance companies for billing preventive-care visit type.   This disclosure ultimately discourages patients financially from reporting significant health issues.   Medical Screening Exam A medical screening exam (MSE) helps to determine whether you need immediate medical treatment relating to any number of symptoms you are having. This type of exam may be done in an emergency department, an urgent care setting, or your health care provider's office. Depending on your symptoms and severity, you may need additional tests or medical therapy. It is important to note that an MSE does not necessarily mean that you will need or receive further medical testing or interventions if your symptoms are not deemed to be medically urgent (emergent). Tell a health care provider about: Any allergies you have. All medicines you are taking, including vitamins, herbs, eye drops, creams, and over-the-counter medicines. Any problems you or family members have had with anesthetic medicines. Any bleeding problems you have. Any surgeries you have had. Any medical conditions you have. Whether you are pregnant or may be pregnant. What happens during the  test? During the exam, a health care provider does a short, often focused, physical exam and asks about your medical history to assess: Your current symptoms. Your overall health. Your need for possible further medical  intervention. What can I expect after the test? If you have a regular health care provider, make an appointment for a follow-up visit with him or her. If you do not have a regular health care provider, ask about resources in your community. Your medical screening exam may determine that: You do not need emergency treatment at this time. You need treatment right away. You need to be transferred to another medical center. This may happen if you need an emergent specialist or consultant that is not available at the medical center you are at. You need to have more tests. A medical specialist may be consulted if needed. Get help right away if: Your condition gets worse. You develop new or troubling symptoms before you see your health care provider. These symptoms may represent a serious problem that is an emergency. Do not wait to see if the symptoms will go away. Get medical help right away. Call your local emergency services (911 in the [place] Do not drive yourself to the hospital. Summary A medical screening exam helps to determine whether you need medical treatment right away. This type of exam may be done in an emergency department, an urgent care setting, or your health care provider's office. During the exam, a health care provider does a short physical exam and asks about your current symptoms and overall health. Depending on the exam, more tests or therapies may be ordered. However, an MSE does not necessarily mean that you will have further medical testing if your symptoms are not deemed to be urgent. If you need further care that is not offered at your current medical center, you may need to be transferred to another facility. This information is not intended to replace advice given to you by your health care provider. Make sure you discuss any questions you have with your health care provider. Document Revised: 12/09/2020 Document Reviewed: 08/06/2020 Elsevier Patient Education  2024  Elsevier Inc.   Health Maintenance, Male Adopting a healthy lifestyle and getting preventive care are important in promoting health and wellness. Ask your health care provider about: The right schedule for you to have regular tests and exams. Things you can do on your own to prevent diseases and keep yourself healthy. What should I know about diet, weight, and exercise? Eat a healthy diet  Eat a diet that includes plenty of vegetables, fruits, low-fat dairy products, and lean protein. Do not eat a lot of foods that are high in solid fats, added sugars, or sodium. Maintain a healthy weight Body mass index (BMI) is a measurement that can be used to identify possible weight problems. It estimates body fat based on height and weight. Your health care provider can help determine your BMI and help you achieve or maintain a healthy weight. Get regular exercise Get regular exercise. This is one of the most important things you can do for your health. Most adults should: Exercise for at least 150 minutes each week. The exercise should increase your heart rate and make you sweat (moderate-intensity exercise). Do strengthening exercises at least twice a week. This is in addition to the moderate-intensity exercise. Spend less time sitting. Even light physical activity can be beneficial. Watch cholesterol and blood lipids Have your blood tested for lipids and cholesterol at 58 years of age,  then have this test every 5 years. You may need to have your cholesterol levels checked more often if: Your lipid or cholesterol levels are high. You are older than 58 years of age. You are at high risk for heart disease. What should I know about cancer screening? Many types of cancers can be detected early and may often be prevented. Depending on your health history and family history, you may need to have cancer screening at various ages. This may include screening for: Colorectal cancer. Prostate cancer. Skin  cancer. Lung cancer. What should I know about heart disease, diabetes, and high blood pressure? Blood pressure and heart disease High blood pressure causes heart disease and increases the risk of stroke. This is more likely to develop in people who have high blood pressure readings or are overweight. Talk with your health care provider about your target blood pressure readings. Have your blood pressure checked: Every 3-5 years if you are 60-5 years of age. Every year if you are 21 years old or older. If you are between the ages of 57 and 2 and are a current or former smoker, ask your health care provider if you should have a one-time screening for abdominal aortic aneurysm (AAA). Diabetes Have regular diabetes screenings. This checks your fasting blood sugar level. Have the screening done: Once every three years after age 48 if you are at a normal weight and have a low risk for diabetes. More often and at a younger age if you are overweight or have a high risk for diabetes. What should I know about preventing infection? Hepatitis B If you have a higher risk for hepatitis B, you should be screened for this virus. Talk with your health care provider to find out if you are at risk for hepatitis B infection. Hepatitis C Blood testing is recommended for: Everyone born from [birth year] through 1965. Anyone with known risk factors for hepatitis C. Sexually transmitted infections (STIs) You should be screened each year for STIs, including gonorrhea and chlamydia, if: You are sexually active and are younger than 58 years of age. You are older than 58 years of age and your health care provider tells you that you are at risk for this type of infection. Your sexual activity has changed since you were last screened, and you are at increased risk for chlamydia or gonorrhea. Ask your health care provider if you are at risk. Ask your health care provider about whether you are at high risk for HIV. Your health  care provider may recommend a prescription medicine to help prevent HIV infection. If you choose to take medicine to prevent HIV, you should first get tested for HIV. You should then be tested every 3 months for as long as you are taking the medicine. Follow these instructions at home: Alcohol use Do not drink alcohol if your health care provider tells you not to drink. If you drink alcohol: Limit how much you have to 0-2 drinks a day. Know how much alcohol is in your drink. In the [place] one drink equals one 12 oz bottle of beer (355 mL), one 5 oz glass of wine (148 mL), or one 1 oz glass of hard liquor (44 mL). Lifestyle Do not use any products that contain nicotine or tobacco. These products include cigarettes, chewing tobacco, and vaping devices, such as e-cigarettes. If you need help quitting, ask your health care provider. Do not use street drugs. Do not share needles. Ask your health care provider for help  if you need support or information about quitting drugs. General instructions Schedule regular health, dental, and eye exams. Stay current with your vaccines. Tell your health care provider if: You often feel depressed. You have ever been abused or do not feel safe at home. Summary Adopting a healthy lifestyle and getting preventive care are important in promoting health and wellness. Follow your health care provider's instructions about healthy diet, exercising, and getting tested or screened for diseases. Follow your health care provider's instructions on monitoring your cholesterol and blood pressure. This information is not intended to replace advice given to you by your health care provider. Make sure you discuss any questions you have with your health care provider. Document Revised: 08/17/2020 Document Reviewed: 08/17/2020 Elsevier Patient Education  2024 [employer].

## 2023-12-14 NOTE — Patient Instructions (Signed)
 VISIT SUMMARY: Today, you had a routine follow-up appointment to review your medications and overall health. We discussed your mechanical heart valve, diabetes management, cholesterol levels, joint pain, insomnia, and upcoming travel plans. Blood tests were ordered to monitor various health parameters, and ear irrigation was performed to remove ear wax buildup.  YOUR PLAN: -MECHANICAL PROSTHETIC HEART VALVE: You have a mechanical heart valve, and your INR levels need to be between 2 and 3. Continue taking warfarin and avoid grapefruit, grapefruit juice, and alcohol. Follow up with the INR clinic on September 19.  -TYPE 2 DIABETES MELLITUS WITHOUT COMPLICATIONS: Your diabetes is well-managed through your diet, with your last A1c at 5.8. Continue with your meatless keto diet and monitor your A1c with the upcoming blood tests.  -MIXED HYPERLIPIDEMIA: You have mixed hyperlipidemia, which was previously managed with statins. We will reassess your cholesterol levels with the upcoming blood tests.  -OBESITY, CLASS 1: Your BMI classifies you as class 1 obesity. Continue managing your weight through your keto diet and intermittent fasting.  -OTHER INSOMNIA: You experience insomnia and use Ambien  about 10 times a month. Be aware of the risks of long-term use, including a potential link to dementia. You have been prescribed 30 tablets with a couple of refills, aiming for this to last the year. Try to minimize your use of Ambien .  -UNSPECIFIED OSTEOARTHRITIS: You have osteoarthritis, which causes joint pain and stiffness. Continue managing this with Tylenol  as needed.  -GENERAL HEALTH MAINTENANCE: Blood tests were ordered to check your diabetic lab work, blood count, metabolic panel, cholesterol, A1c, urine protein, and thyroid function. Consider checking vitamin D levels if they were previously low. Ear irrigation was performed to remove ear wax buildup.  INSTRUCTIONS: Follow up with the INR clinic on  September 19. Continue with your current medications and dietary management. Minimize the use of Ambien  for insomnia. Monitor your health with the upcoming blood tests.  Welcome aboard!   Today's visit was a valuable first step in understanding your health and starting your personalized care journey. We discussed your medical history and medications in detail. Given the extensive information, we prioritized addressing your most pressing concerns.  We understood those concerns to be:  New Pt (Pt is present to est care with pcp pt see heart dr with cone in November coming up )   Building a Complete Picture  To create the most effective care plan possible, we may need additional information from previous providers. We encouraged you to gather any relevant medical records for your next visit. This will help us  build a more complete picture and develop a personalized plan together. In the meantime, we'll address your immediate concerns and provide resources to help you manage all of your medical issues.  We encourage you to use MyChart to review these efforts, and to help us  find and correct any omissions or errors in your medical chart.  Managing Your Health Over Time  Managing every aspect of your health in a single visit isn't always feasible, but that's okay.  We addressed your most pressing concerns today and charted a course for future care. Acute conditions or preventive care measures may require further attention.  We encourage you to schedule a follow-up visit at your earliest convenience to discuss any unresolved issues.  We strongly encourage participation in annual preventive care visits to help us  develop a more thorough understanding of your health and to help you maintain optimal wellness - please inquire about scheduling your next one with us   at your earliest convenience.  Your Satisfaction Matters  It was a pleasure seeing you today!  Your health and satisfaction will always be my top  priorities. If you believe your experience today was worthy of a 5-star rating, I'd be grateful for your feedback!  Bernardino KANDICE Cone, MD  Next Steps  Schedule Follow-Up:  We recommend a follow-up appointment in 1 year for your next wellness visit.  If you develop any new problems, want to address any medical issues, or your condition worsens before then, please call us  for an appointment or seek emergency care. Preventive Care:  Don't forget to schedule your annual preventive care visit!  Please review your attached preventive care information. Make sure to arrange appointments for dental and vision routine screening, and use nightly nasal saline mist to keep your sinuses clear. Medical Information Release:  For any relevant medical information we don't have, please sign a release form at the front desk so we can obtain it for your records. Lab & X-ray Appointments:  Scheduled any incomplete lab tests today or call us  to schedule.  X-Rays can be done without an appointment at [facility] ([address]), M-F 8:30am-noon or 1pm-5pm.  Just tell them you're there for X-rays ordered by Dr. Cone.  We'll receive the results and contact you by phone or MyChart to discuss next steps.  Making the Most of Our Focused (20 minute) Appointments:  [x]   Clearly state your top concerns at the beginning of the visit to focus our discussion [x]   If you anticipate you will need more time, please inform the front desk during scheduling - we can book multiple appointments in the same week. [x]   If you have transportation problems- use our convenient video appointments or ask about transportation support. [x]   We can get down to business faster if you use MyChart to update information before the visit and submit non-urgent questions before your visit. Thank you for taking the time to provide details through MyChart.  Let our nurse know and she can import this information into your  encounter documents.  Arrival and Wait Times: [x]   Arriving on time ensures that everyone receives prompt attention. [x]   Early morning (8a) and afternoon (1p) appointments tend to have shortest wait times. [x]   Unfortunately, we cannot delay appointments for late arrivals or hold slots during phone calls.  Thank you for collaborating with us  to prioritize your health. We look forward to serving you!  Bring to Your Next Appointment  Medications: Please bring all your medication bottles to your next appointment to ensure we have an accurate record of your prescriptions. Health Diaries: If you're monitoring any health conditions at home, keeping a diary of your readings can be very helpful for discussions at your next appointment.  Reviewing Your Records  Please Review this early draft of your clinical notes below and the final encounter summary tomorrow on MyChart after its been completed.   Mixed hyperlipidemia  Encounter for annual general medical examination with abnormal findings in adult  Immunization due -     Flu vaccine trivalent PF, 6mos and older(Flulaval,Afluria,Fluarix,Fluzone)  S/P AVR (aortic valve replacement) and aortoplasty -     Carvedilol ; Take 1 tablet (12.5 mg total) by mouth 2 (two) times daily with a meal.  Dispense: 180 tablet; Refill: 3  NICM (nonischemic cardiomyopathy) (HCC) -     Lisinopril ; Take 1 tablet (5 mg total) by mouth daily.  Dispense: 90 tablet; Refill: 3  Other insomnia -     Zolpidem  Tartrate; Take 0.5 tablets (5 mg total) by mouth daily.  Dispense: 45 tablet; Refill: 1  Arthritis -     Acetaminophen ; Take 1 tablet (500 mg total) by mouth every 6 (six) hours as needed for mild pain (pain score 1-3) or fever.  Dispense: 90 tablet; Refill: 4  Class 1 obesity due to excess calories with serious comorbidity and body mass index (BMI) of 30.0 to 30.9 in adult -     TSH Rfx on Abnormal to Free T4  Type 2 diabetes mellitus without complication,  without long-term current use of insulin  (HCC) -     CBC with Differential/Platelet -     Comprehensive metabolic panel with GFR -     Lipid panel -     Hemoglobin A1c -     Microalbumin / creatinine urine ratio     Getting Answers and Following Up  Simple Questions & Concerns: For quick questions or basic follow-up after your visit, reach us  at [phone] or MyChart messaging. Complex Concerns: If your concern is more complex, scheduling an appointment might be best. Discuss this with the staff to find the most suitable option. Lab & Imaging Results: We'll contact you directly if results are abnormal or you don't use MyChart. Most normal results will be on MyChart within 2-3 business days, with a review message from Dr. Jesus. Haven't heard back in 2 weeks? Need results sooner? Contact us  at [phone]. Referrals: Our referral coordinator will manage specialist referrals. The specialist's office should contact you within 2 weeks to schedule an appointment. Call us  if you haven't heard from them after 2 weeks.  Staying Connected  MyChart: Activate your MyChart for the fastest way to access results and message us . See the last page of this paperwork for instructions on how to activate.  Billing  X-ray & Lab Orders: These are billed by separate companies. Contact the invoicing company directly for questions or concerns. Visit Charges: Discuss any billing inquiries with our administrative services team.  Feedback & Satisfaction  Share Your Experience: We strive for your satisfaction! If you have any complaints, or preferably compliments, please let Dr. Jesus know directly or contact our Practice Administrators, Manuelita Rubin or [employer], by asking at the front desk.   Scheduling Tips  Shorter Wait Times: 8 am and 1 pm appointments often have the quickest wait times. Longer Appointments: If you need more time during your visit, talk to the front desk. Due to insurance  regulations, multiple back-to-back appointments might be necessary.   Medical Screening Exam A medical screening exam (MSE) helps to determine whether you need immediate medical treatment relating to any number of symptoms you are having. This type of exam may be done in an emergency department, an urgent care setting, or your health care provider's office. Depending on your symptoms and severity, you may need additional tests or medical therapy. It is important to note that an MSE does not necessarily mean that you will need or receive further medical testing or interventions if your symptoms are not deemed to be medically urgent (emergent). Tell a health care provider about: Any allergies you have. All medicines you are taking, including vitamins, herbs, eye drops, creams, and over-the-counter medicines. Any problems you or family members have had with anesthetic medicines. Any bleeding problems you have. Any surgeries you have had. Any medical conditions you have. Whether you are pregnant or may be pregnant. What happens during the  test? During the exam, a health care provider does a short, often focused, physical exam and asks about your medical history to assess: Your current symptoms. Your overall health. Your need for possible further medical intervention. What can I expect after the test? If you have a regular health care provider, make an appointment for a follow-up visit with him or her. If you do not have a regular health care provider, ask about resources in your community. Your medical screening exam may determine that: You do not need emergency treatment at this time. You need treatment right away. You need to be transferred to another medical center. This may happen if you need an emergent specialist or consultant that is not available at the medical center you are at. You need to have more tests. A medical specialist may be consulted if needed. Get help right away  if: Your condition gets worse. You develop new or troubling symptoms before you see your health care provider. These symptoms may represent a serious problem that is an emergency. Do not wait to see if the symptoms will go away. Get medical help right away. Call your local emergency services (911 in the [place] Do not drive yourself to the hospital. Summary A medical screening exam helps to determine whether you need medical treatment right away. This type of exam may be done in an emergency department, an urgent care setting, or your health care provider's office. During the exam, a health care provider does a short physical exam and asks about your current symptoms and overall health. Depending on the exam, more tests or therapies may be ordered. However, an MSE does not necessarily mean that you will have further medical testing if your symptoms are not deemed to be urgent. If you need further care that is not offered at your current medical center, you may need to be transferred to another facility. This information is not intended to replace advice given to you by your health care provider. Make sure you discuss any questions you have with your health care provider. Document Revised: 12/09/2020 Document Reviewed: 08/06/2020 Elsevier Patient Education  2024 [employer].

## 2023-12-14 NOTE — Assessment & Plan Note (Signed)
 Possibly prediabetes only- but listed as diabetes mellitus 2. Type 2 diabetes is managed with diet, with the last A1c at 5.8. He is not on diabetes medication and follows a meatless keto diet. Monitor A1c with upcoming blood tests and continue dietary management.

## 2023-12-14 NOTE — Assessment & Plan Note (Signed)
 Presence of mechanical prosthetic heart valve   He has had a mechanical prosthetic heart valve since 2015 with an INR target range of 2-3. The last echocardiogram showed no valve issues. Continue the current warfarin regimen and follow up with the INR clinic on September 19. Avoid grapefruit, grapefruit juice, and alcohol due to warfarin interaction.

## 2023-12-14 NOTE — Assessment & Plan Note (Signed)
 Osteoarthritis causes stiffness, especially with prolonged sitting, and is managed with Tylenol  as needed. Continue using Tylenol  as needed for joint pain.

## 2023-12-14 NOTE — Assessment & Plan Note (Signed)
?   Diagnosis- presumably due to now repaired to valvular disease.

## 2023-12-14 NOTE — Assessment & Plan Note (Signed)
 Other insomnia   Insomnia is managed with Ambien , taken approximately 10 times per month. Discussed risks of long-term use, including a potential link to dementia. Prescribe Ambien  with 30 tablets and a couple of refills, aiming for use to last the year. Encourage minimizing use of Ambien .

## 2023-12-14 NOTE — Assessment & Plan Note (Signed)
 Obesity, class 1   With a BMI of 30, he is classified as class 1 obesity. He prefers dietary management over medication and follows a keto diet with intermittent fasting. Continue current dietary management.

## 2023-12-15 LAB — COMPREHENSIVE METABOLIC PANEL WITH GFR
ALT: 20 U/L (ref 0–53)
AST: 26 U/L (ref 0–37)
Albumin: 4.5 g/dL (ref 3.5–5.2)
Alkaline Phosphatase: 52 U/L (ref 39–117)
BUN: 15 mg/dL (ref 6–23)
CO2: 30 meq/L (ref 19–32)
Calcium: 9.3 mg/dL (ref 8.4–10.5)
Chloride: 102 meq/L (ref 96–112)
Creatinine, Ser: 0.75 mg/dL (ref 0.40–1.50)
GFR: 99.75 mL/min (ref >60.00)
Glucose, Bld: 106 mg/dL — ABNORMAL HIGH (ref 70–99)
Potassium: 4.2 meq/L (ref 3.5–5.1)
Sodium: 141 meq/L (ref 135–145)
Total Bilirubin: 0.4 mg/dL (ref 0.2–1.2)
Total Protein: 7.2 g/dL (ref 6.0–8.3)

## 2023-12-15 LAB — CBC WITH DIFFERENTIAL/PLATELET
Basophils Absolute: 0 K/uL (ref 0.0–0.1)
Basophils Relative: 0.9 % (ref 0.0–3.0)
Eosinophils Absolute: 0.1 K/uL (ref 0.0–0.7)
Eosinophils Relative: 2.8 % (ref 0.0–5.0)
HCT: 41.3 % (ref 39.0–52.0)
Hemoglobin: 14.1 g/dL (ref 13.0–17.0)
Lymphocytes Relative: 28.5 % (ref 12.0–46.0)
Lymphs Abs: 1.4 K/uL (ref 0.7–4.0)
MCHC: 34.1 g/dL (ref 30.0–36.0)
MCV: 81.5 fl (ref 78.0–100.0)
Monocytes Absolute: 0.3 K/uL (ref 0.1–1.0)
Monocytes Relative: 6.6 % (ref 3.0–12.0)
Neutro Abs: 3 K/uL (ref 1.4–7.7)
Neutrophils Relative %: 61.2 % (ref 43.0–77.0)
Platelets: 182 K/uL (ref 150.0–400.0)
RBC: 5.07 Mil/uL (ref 4.22–5.81)
RDW: 14.2 % (ref 11.5–15.5)
WBC: 4.8 K/uL (ref 4.0–10.5)

## 2023-12-15 LAB — LIPID PANEL
Cholesterol: 172 mg/dL (ref 0–200)
HDL: 27.2 mg/dL — ABNORMAL LOW (ref >39.00)
NonHDL: 145.16
Total CHOL/HDL Ratio: 6
Triglycerides: 507 mg/dL — ABNORMAL HIGH (ref 0.0–149.0)
VLDL: 101.4 mg/dL — ABNORMAL HIGH (ref 0.0–40.0)

## 2023-12-15 LAB — MICROALBUMIN / CREATININE URINE RATIO
Creatinine,U: 44.8 mg/dL
Microalb Creat Ratio: UNDETERMINED mg/g (ref 0.0–30.0)
Microalb, Ur: <0.7 mg/dL

## 2023-12-15 LAB — LDL CHOLESTEROL, DIRECT: Direct LDL: 99 mg/dL

## 2023-12-15 LAB — TSH RFX ON ABNORMAL TO FREE T4: TSH: 1.7 u[IU]/mL (ref 0.450–4.500)

## 2023-12-15 LAB — HEMOGLOBIN A1C: Hgb A1c MFr Bld: 7 % — ABNORMAL HIGH (ref 4.6–6.5)

## 2023-12-17 ENCOUNTER — Ambulatory Visit: Payer: Self-pay | Admitting: Internal Medicine

## 2023-12-29 ENCOUNTER — Ambulatory Visit: Attending: Cardiovascular Disease

## 2023-12-29 DIAGNOSIS — Z952 Presence of prosthetic heart valve: Secondary | ICD-10-CM

## 2023-12-29 DIAGNOSIS — Z7901 Long term (current) use of anticoagulants: Secondary | ICD-10-CM

## 2023-12-29 DIAGNOSIS — Z954 Presence of other heart-valve replacement: Secondary | ICD-10-CM

## 2023-12-29 LAB — POCT INR: INR: 2.3 (ref 2.0–3.0)

## 2023-12-29 NOTE — Patient Instructions (Signed)
 Description   NO PRINTOUT NEEDED. INR 2.3, Continue taking Warfarin 1.5 tablets daily except for 1 tablet on Fridays. Stay consistent with greens (3 per week)Recheck INR in 8 weeks (normally 8 weeks).  Call with any changes [phone] or [phone].  Keep the same diet you have been eating.

## 2023-12-29 NOTE — Progress Notes (Signed)
 Description   NO PRINTOUT NEEDED. INR 2.3, Continue taking Warfarin 1.5 tablets daily except for 1 tablet on Fridays. Stay consistent with greens (3 per week)Recheck INR in 8 weeks (normally 8 weeks).  Call with any changes [phone] or [phone].  Keep the same diet you have been eating.

## 2024-01-15 ENCOUNTER — Other Ambulatory Visit: Payer: Self-pay | Admitting: Cardiovascular Disease

## 2024-01-15 DIAGNOSIS — Z952 Presence of prosthetic heart valve: Secondary | ICD-10-CM

## 2024-01-27 ENCOUNTER — Other Ambulatory Visit: Payer: Self-pay | Admitting: Internal Medicine

## 2024-02-23 ENCOUNTER — Ambulatory Visit: Attending: Cardiovascular Disease

## 2024-02-23 DIAGNOSIS — Z954 Presence of other heart-valve replacement: Secondary | ICD-10-CM | POA: Diagnosis not present

## 2024-02-23 DIAGNOSIS — Z952 Presence of prosthetic heart valve: Secondary | ICD-10-CM

## 2024-02-23 DIAGNOSIS — Z7901 Long term (current) use of anticoagulants: Secondary | ICD-10-CM | POA: Diagnosis not present

## 2024-02-23 LAB — POCT INR: INR: 2.1 (ref 2.0–3.0)

## 2024-02-23 NOTE — Patient Instructions (Signed)
 NO PRINTOUT NEEDED. Continue taking Warfarin 1.5 tablets daily except for 1 tablet on Fridays. Stay consistent with greens (3 per week)Recheck INR in 8 weeks (normally 8 weeks).  Call with any changes [phone] or [phone].  Keep the same diet you have been eating.

## 2024-02-23 NOTE — Progress Notes (Signed)
 INR 2.1 Please see anticoagulation encounter NO PRINTOUT NEEDED. Continue taking Warfarin 1.5 tablets daily except for 1 tablet on Fridays. Stay consistent with greens (3 per week)Recheck INR in 8 weeks (normally 8 weeks).  Call with any changes [phone] or [phone].  Keep the same diet you have been eating.

## 2024-03-21 ENCOUNTER — Ambulatory Visit: Attending: Cardiovascular Disease | Admitting: Cardiovascular Disease

## 2024-03-21 ENCOUNTER — Encounter: Payer: Self-pay | Admitting: Cardiovascular Disease

## 2024-03-21 VITALS — BP 120/84 | HR 61 | Ht 70.0 in | Wt 214.8 lb

## 2024-03-21 DIAGNOSIS — I428 Other cardiomyopathies: Secondary | ICD-10-CM | POA: Diagnosis not present

## 2024-03-21 DIAGNOSIS — E782 Mixed hyperlipidemia: Secondary | ICD-10-CM | POA: Diagnosis not present

## 2024-03-21 DIAGNOSIS — Z952 Presence of prosthetic heart valve: Secondary | ICD-10-CM

## 2024-03-21 NOTE — Assessment & Plan Note (Addendum)
 Patient had full recovery of LV function after aortic valve replacement.  His echocardiogram 1 year ago showed an LVEF of 55 to 60% and normal function of his mechanical aortic prosthesis.  Continue carvedilol  and lisinopril  at current doses.

## 2024-03-21 NOTE — Patient Instructions (Signed)
 Medication Instructions:  Your physician recommends that you continue on your current medications as directed. Please refer to the Current Medication list given to you today.  *If you need a refill on your cardiac medications before your next appointment, please call your pharmacy*  Lab Work: None.  If you have labs (blood work) drawn today and your tests are completely normal, you will receive your results only by: MyChart Message (if you have MyChart) OR A paper copy in the mail If you have any lab test that is abnormal or we need to change your treatment, we will call you to review the results.  Testing/Procedures: Your physician has requested that you have an echocardiogram in one year. Echocardiography is a painless test that uses sound waves to create images of your heart. It provides your doctor with information about the size and shape of your heart and how well your hearts chambers and valves are working. This procedure takes approximately one hour. There are no restrictions for this procedure. Please do NOT wear cologne, perfume, aftershave, or lotions (deodorant is allowed). Please arrive 15 minutes prior to your appointment time.  Please note: We ask at that you not bring children with you during ultrasound (echo/ vascular) testing. Due to room size and safety concerns, children are not allowed in the ultrasound rooms during exams. Our front office staff cannot provide observation of children in our lobby area while testing is being conducted. An adult accompanying a patient to their appointment will only be allowed in the ultrasound room at the discretion of the ultrasound technician under special circumstances. We apologize for any inconvenience.   Follow-Up: At [facility], you and your health needs are our priority.  As part of our continuing mission to provide you with exceptional heart care, our providers are all part of one team.  This team includes your primary  Cardiologist (physician) and Advanced Practice Providers or APPs (Physician Assistants and Nurse Practitioners) who all work together to provide you with the care you need, when you need it.  Your next appointment:   1 year(s)  Provider:   Ozell Fell, MD  (Make sure to have your echocardiogram done a few days prior to your visit with Dr. Fell)

## 2024-03-21 NOTE — Assessment & Plan Note (Addendum)
 Last LDL cholesterol was 99.

## 2024-03-21 NOTE — Progress Notes (Signed)
 Cardiology Office Note:    Date:  03/21/2024   ID:  Jeffrey [name], DOB 1965/12/20, MRN [id number]  PCP:  Jesus Bernardino MATSU, MD   [facility] Providers Cardiologist:  Ozell Fell, MD     Referring MD: Jesus Bernardino MATSU, MD   Chief Complaint  Patient presents with   Follow-up    Aortic Valve Disease    History of Present Illness:    Jeffrey [name] is a 58 y.o. male with a hx of ascending thoracic aortic aneurysm and severe aortic valve insufficiency who underwent Bentall surgery with a Saint Jude 27 mm mechanical valve and a 30 mm Dacron aortic graft in 2015. He has done well with tolerance of long-term warfarin and has had no bleeding problems.   The patient is here alone today.  He continues to do very well.  He has no chest pain, chest pressure, or shortness of breath.  His blood sugars have trended up and his most recent hemoglobin A1c was 7.0.  He is working at diet and lifestyle modification.  He is compliant with warfarin and has no concerns.  No edema, orthopnea, PND, heart palpitations, lightheadedness, or syncope.   Current Medications: Active Medications[1]   Allergies:   Patient has no known allergies.   ROS:   Please see the history of present illness.    All other systems reviewed and are negative.  EKGs/Labs/Other Studies Reviewed:    The following studies were reviewed today: Cardiac Studies & Procedures   ______________________________________________________________________________________________     ECHOCARDIOGRAM  ECHOCARDIOGRAM COMPLETE 02/10/2023  Narrative ECHOCARDIOGRAM REPORT    Patient Name:   Jeffrey [name] Date of Exam: 02/10/2023 Medical Rec #:  [id number]          Height:       70.0 in Accession #:    [id number]         Weight:       216.0 lb Date of Birth:  06-05-1965          BSA:          2.157 m Patient Age:    57 years           BP:           120/72 mmHg Patient Gender: M                  HR:           58  bpm. Exam Location:  [address]  Procedure: 2D Echo, Color Doppler, Cardiac Doppler and Intracardiac Opacification Agent  Indications:    Z95.2 S/p AVR  History:        Patient has prior history of Echocardiogram examinations, most recent 08/09/2019. Aneurysm of ascending aorta, S/p AVR (27mm St Jude); Risk Factors:Hypertension. Aortic Valve: 27 mm St. Jude valve is present in the aortic position. Procedure Date: 02/24/14.  Sonographer:    Elsie Bohr RDCS Referring Phys: [phone] Danasia Baker   Sonographer Comments: Technically difficult study due to poor echo windows. IMPRESSIONS   1. Left ventricular ejection fraction, by estimation, is 55 to 60%. The left ventricle has normal function. The left ventricle has no regional wall motion abnormalities. There is mild left ventricular hypertrophy. Left ventricular diastolic function could not be evaluated. 2. Right ventricular systolic function is normal. The right ventricular size is normal. 3. The mitral valve is normal in structure. No evidence of mitral valve regurgitation. No evidence of mitral stenosis. 4. The aortic valve has been repaired/replaced. Aortic  valve regurgitation is trivial. No aortic stenosis is present. There is a 27 mm St. Jude valve present in the aortic position. Procedure Date: 02/24/14. Echo findings are consistent with normal structure and function of the aortic valve prosthesis.  FINDINGS Left Ventricle: Left ventricular ejection fraction, by estimation, is 55 to 60%. The left ventricle has normal function. The left ventricle has no regional wall motion abnormalities. Definity  contrast agent was given IV to delineate the left ventricular endocardial borders. The left ventricular internal cavity size was normal in size. There is mild left ventricular hypertrophy. Left ventricular diastolic function could not be evaluated.  Right Ventricle: The right ventricular size is normal. Right ventricular systolic  function is normal.  Left Atrium: Left atrial size was normal in size.  Right Atrium: Right atrial size was normal in size.  Pericardium: There is no evidence of pericardial effusion.  Mitral Valve: The mitral valve is normal in structure. Mild mitral annular calcification. No evidence of mitral valve regurgitation. No evidence of mitral valve stenosis.  Tricuspid Valve: The tricuspid valve is normal in structure. Tricuspid valve regurgitation is trivial. No evidence of tricuspid stenosis.  Aortic Valve: The aortic valve has been repaired/replaced. Aortic valve regurgitation is trivial. No aortic stenosis is present. Aortic valve mean gradient measures 9.3 mmHg. Aortic valve peak gradient measures 19.2 mmHg. Aortic valve area, by VTI measures 1.75 cm. There is a 27 mm St. Jude valve present in the aortic position. Procedure Date: 02/24/14. Echo findings are consistent with normal structure and function of the aortic valve prosthesis.  Pulmonic Valve: The pulmonic valve was normal in structure. Pulmonic valve regurgitation is trivial. No evidence of pulmonic stenosis.  Aorta: The aortic root is normal in size and structure.  Venous: The inferior vena cava was not well visualized.  IAS/Shunts: The interatrial septum is aneurysmal. The interatrial septum was not well visualized.   LEFT VENTRICLE PLAX 2D LVIDd:         5.00 cm LVIDs:         3.10 cm LV PW:         1.20 cm LV IVS:        1.40 cm LVOT diam:     2.40 cm LV SV:         78 LV SV Index:   36 LVOT Area:     4.52 cm   RIGHT VENTRICLE TAPSE (M-mode): 1.1 cm RVSP:           24.3 mmHg  LEFT ATRIUM             Index        RIGHT ATRIUM           Index LA diam:        4.20 cm 1.95 cm/m   RA Pressure: 3.00 mmHg LA Vol (A2C):   54.4 ml 25.22 ml/m  RA Area:     21.80 cm LA Vol (A4C):   51.2 ml 23.74 ml/m  RA Volume:   66.20 ml  30.69 ml/m LA Biplane Vol: 57.1 ml 26.48 ml/m AORTIC VALVE AV Area (Vmax):    1.50  cm AV Area (Vmean):   1.63 cm AV Area (VTI):     1.75 cm AV Vmax:           219.33 cm/s AV Vmean:          141.667 cm/s AV VTI:            0.447 m AV Peak Grad:  19.2 mmHg AV Mean Grad:      9.3 mmHg LVOT Vmax:         72.70 cm/s LVOT Vmean:        51.000 cm/s LVOT VTI:          0.173 m LVOT/AV VTI ratio: 0.39  AORTA Ao Root diam: 3.60 cm Ao Asc diam:  3.40 cm  MITRAL VALVE                TRICUSPID VALVE MV Area (PHT): 2.87 cm     TR Peak grad:   21.3 mmHg MV Decel Time: 264 msec     TR Vmax:        231.00 cm/s MV E velocity: 99.00 cm/s   Estimated RAP:  3.00 mmHg MV A velocity: 107.00 cm/s  RVSP:           24.3 mmHg MV E/A ratio:  0.93 SHUNTS Systemic VTI:  0.17 m Systemic Diam: 2.40 cm  Redell Shallow MD Electronically signed by Redell Shallow MD Signature Date/Time: 02/10/2023/1:10:18 PM    Final          ______________________________________________________________________________________________      EKG:   EKG Interpretation Date/Time:  Thursday March 21 2024 14:07:55 EST Ventricular Rate:  61 PR Interval:  216 QRS Duration:  110 QT Interval:  448 QTC Calculation: 450 R Axis:   10  Text Interpretation: Sinus rhythm with 1st degree A-V block ST & T wave abnormality, consider lateral ischemia When compared with ECG of 24-Feb-2023 10:35, T wave inversion more evident in Lateral leads Otherwise no significant change Confirmed by Wonda Sharper [phone]) on 03/21/2024 2:25:11 PM    Recent Labs: 12/14/2023: ALT 20; BUN 15; Creatinine, Ser 0.75; Hemoglobin 14.1; Platelets 182.0; Potassium 4.2; Sodium 141; TSH 1.700  Recent Lipid Panel    Component Value Date/Time   CHOL 172 12/14/2023 1527   TRIG (H) 12/14/2023 1527    507.0 Triglyceride is over 400; calculations on Lipids are invalid.   HDL 27.20 (L) 12/14/2023 1527   CHOLHDL 6 12/14/2023 1527   VLDL 101.4 (H) 12/14/2023 1527   LDLDIRECT 99.0 12/14/2023 1527     Risk  Assessment/Calculations:                Physical Exam:    VS:  BP 120/84 (BP Location: Left Arm, Patient Position: Sitting, Cuff Size: Normal)   Pulse 61   Ht 5' 10 (1.778 m)   Wt 214 lb 12.8 oz (97.4 kg)   SpO2 98%   BMI 30.82 kg/m     Wt Readings from Last 3 Encounters:  03/21/24 214 lb 12.8 oz (97.4 kg)  12/14/23 214 lb 12.8 oz (97.4 kg)  02/24/23 218 lb (98.9 kg)     GEN:  Well nourished, well developed in no acute distress HEENT: Normal NECK: No JVD; No carotid bruits LYMPHATICS: No lymphadenopathy CARDIAC: RRR, normal mechanical A2, soft 1/6 systolic murmur at the right upper sternal border RESPIRATORY:  Clear to auscultation without rales, wheezing or rhonchi  ABDOMEN: Soft, non-tender, non-distended MUSCULOSKELETAL:  No edema; No deformity  SKIN: Warm and dry NEUROLOGIC:  Alert and oriented x 3 PSYCHIATRIC:  Normal affect   Assessment & Plan S/P AVR (aortic valve replacement) and aortoplasty Remains asymptomatic with normal function of his mechanical bileaflet aortic valve, mean gradient 9 mmHg on echo last year.  Denies bleeding problems on chronic warfarin.  Repeat echocardiogram next year.  I do not think he needs a study this year  as his exam remains stable and he is clinically asymptomatic.  I discussed the natural history of his aortic valve replacement and he understands that typically a mechanical bileaflet valve has great durability. NICM (nonischemic cardiomyopathy) (HCC) Patient had full recovery of LV function after aortic valve replacement.  His echocardiogram 1 year ago showed an LVEF of 55 to 60% and normal function of his mechanical aortic prosthesis.  Continue carvedilol  and lisinopril  at current doses. Mixed hyperlipidemia Last LDL cholesterol was 99.            Medication Adjustments/Labs and Tests Ordered: Current medicines are reviewed at length with the patient today.  Concerns regarding medicines are outlined above.  Orders Placed  This Encounter  Procedures   EKG 12-Lead   ECHOCARDIOGRAM COMPLETE   No orders of the defined types were placed in this encounter.   Patient Instructions  Medication Instructions:  Your physician recommends that you continue on your current medications as directed. Please refer to the Current Medication list given to you today.  *If you need a refill on your cardiac medications before your next appointment, please call your pharmacy*  Lab Work: None.  If you have labs (blood work) drawn today and your tests are completely normal, you will receive your results only by: MyChart Message (if you have MyChart) OR A paper copy in the mail If you have any lab test that is abnormal or we need to change your treatment, we will call you to review the results.  Testing/Procedures: Your physician has requested that you have an echocardiogram in one year. Echocardiography is a painless test that uses sound waves to create images of your heart. It provides your doctor with information about the size and shape of your heart and how well your hearts chambers and valves are working. This procedure takes approximately one hour. There are no restrictions for this procedure. Please do NOT wear cologne, perfume, aftershave, or lotions (deodorant is allowed). Please arrive 15 minutes prior to your appointment time.  Please note: We ask at that you not bring children with you during ultrasound (echo/ vascular) testing. Due to room size and safety concerns, children are not allowed in the ultrasound rooms during exams. Our front office staff cannot provide observation of children in our lobby area while testing is being conducted. An adult accompanying a patient to their appointment will only be allowed in the ultrasound room at the discretion of the ultrasound technician under special circumstances. We apologize for any inconvenience.   Follow-Up: At [facility], you and your health needs are our  priority.  As part of our continuing mission to provide you with exceptional heart care, our providers are all part of one team.  This team includes your primary Cardiologist (physician) and Advanced Practice Providers or APPs (Physician Assistants and Nurse Practitioners) who all work together to provide you with the care you need, when you need it.  Your next appointment:   1 year(s)  Provider:   Ozell Fell, MD  (Make sure to have your echocardiogram done a few days prior to your visit with Dr. Fell)         Signed, Ozell Fell, MD  03/21/2024 4:20 PM    [facility]     [1]  Current Meds  Medication Sig   acetaminophen  (TYLENOL ) 500 MG tablet Take 1 tablet (500 mg total) by mouth every 6 (six) hours as needed for mild pain (pain score 1-3) or fever.   carvedilol  (  COREG ) 12.5 MG tablet Take 1 tablet (12.5 mg total) by mouth 2 (two) times daily with a meal.   lisinopril  (ZESTRIL ) 5 MG tablet Take 1 tablet (5 mg total) by mouth daily.   warfarin (COUMADIN ) 7.5 MG tablet TAKE 1 AND 1/2 TABLETS BY MOUTH  DAILY EXCEPT 1 TABLET BY MOUTH  DAILY ON FRIDAYS OR AS DIRECTED  BY ANTICOAGULATION CLINIC   zolpidem  (AMBIEN ) 10 MG tablet Take 0.5 tablets (5 mg total) by mouth daily.   [DISCONTINUED] rosuvastatin (CRESTOR) 5 MG tablet Take 5 mg by mouth at bedtime.

## 2024-04-02 ENCOUNTER — Other Ambulatory Visit: Payer: Self-pay | Admitting: Cardiovascular Disease

## 2024-04-02 DIAGNOSIS — I428 Other cardiomyopathies: Secondary | ICD-10-CM

## 2024-04-02 DIAGNOSIS — Z952 Presence of prosthetic heart valve: Secondary | ICD-10-CM

## 2024-04-19 ENCOUNTER — Ambulatory Visit

## 2024-04-22 ENCOUNTER — Ambulatory Visit: Attending: Cardiovascular Disease

## 2024-04-22 DIAGNOSIS — Z952 Presence of prosthetic heart valve: Secondary | ICD-10-CM

## 2024-04-22 DIAGNOSIS — Z954 Presence of other heart-valve replacement: Secondary | ICD-10-CM

## 2024-04-22 DIAGNOSIS — Z7901 Long term (current) use of anticoagulants: Secondary | ICD-10-CM

## 2024-04-22 LAB — POCT INR: INR: 3.2 — AB (ref 2.0–3.0)

## 2024-04-22 NOTE — Patient Instructions (Signed)
 Description   INR 3.2, NO PRINTOUT NEEDED. Take 1/2 tablet today, then resume same dosage of Warfarin 1.5 tablets daily except for 1 tablet on Fridays. Stay consistent with greens (3 per week)Recheck INR in 6 weeks (normally 8 weeks).  Call with any changes [phone] or [phone].  Keep the same diet you have been eating.

## 2024-04-22 NOTE — Progress Notes (Signed)
 Description   INR 3.2, NO PRINTOUT NEEDED. Take 1/2 tablet today, then resume same dosage of Warfarin 1.5 tablets daily except for 1 tablet on Fridays. Stay consistent with greens (3 per week)Recheck INR in 6 weeks (normally 8 weeks).  Call with any changes [phone] or [phone].  Keep the same diet you have been eating.

## 2024-05-31 ENCOUNTER — Ambulatory Visit

## 2025-03-14 ENCOUNTER — Ambulatory Visit (HOSPITAL_COMMUNITY)
